# Patient Record
Sex: Male | Born: 1950 | Race: White | Hispanic: No | Marital: Married | Smoking: Never smoker
Health system: Southern US, Community
[De-identification: ages and names within clinical notes are randomized; demographics above are authoritative.]

## PROBLEM LIST (undated history)

## (undated) DIAGNOSIS — I10 Essential (primary) hypertension: Secondary | ICD-10-CM

## (undated) DIAGNOSIS — J9600 Acute respiratory failure, unspecified whether with hypoxia or hypercapnia: Secondary | ICD-10-CM

## (undated) DIAGNOSIS — R569 Unspecified convulsions: Principal | ICD-10-CM

## (undated) DIAGNOSIS — I629 Nontraumatic intracranial hemorrhage, unspecified: Secondary | ICD-10-CM

## (undated) DIAGNOSIS — N200 Calculus of kidney: Secondary | ICD-10-CM

## (undated) HISTORY — PX: TONSILLECTOMY: SUR1361

## (undated) HISTORY — PX: OTHER SURGICAL HISTORY: SHX169

## (undated) HISTORY — PX: LITHOTRIPSY: SUR834

---

## 2002-07-15 ENCOUNTER — Encounter: Payer: Self-pay | Admitting: Internal Medicine

## 2003-11-05 ENCOUNTER — Ambulatory Visit (HOSPITAL_COMMUNITY): Admission: RE | Admit: 2003-11-05 | Discharge: 2003-11-05 | Payer: Self-pay | Admitting: Gastroenterology

## 2007-06-11 ENCOUNTER — Ambulatory Visit: Payer: Self-pay | Admitting: Internal Medicine

## 2007-06-11 DIAGNOSIS — I10 Essential (primary) hypertension: Secondary | ICD-10-CM | POA: Insufficient documentation

## 2007-06-11 DIAGNOSIS — J309 Allergic rhinitis, unspecified: Secondary | ICD-10-CM | POA: Insufficient documentation

## 2007-06-11 DIAGNOSIS — E785 Hyperlipidemia, unspecified: Secondary | ICD-10-CM | POA: Insufficient documentation

## 2007-06-13 ENCOUNTER — Encounter: Payer: Self-pay | Admitting: Internal Medicine

## 2007-06-13 LAB — CONVERTED CEMR LAB
Albumin: 4 g/dL (ref 3.5–5.2)
BUN: 16 mg/dL (ref 6–23)
Basophils Absolute: 0 10*3/uL (ref 0.0–0.1)
Basophils Relative: 0.7 % (ref 0.0–1.0)
CO2: 30 meq/L (ref 19–32)
Calcium: 9.4 mg/dL (ref 8.4–10.5)
Chloride: 108 meq/L (ref 96–112)
Cholesterol: 165 mg/dL (ref 0–200)
Creatinine, Ser: 1 mg/dL (ref 0.4–1.5)
Eosinophils Absolute: 0.1 10*3/uL (ref 0.0–0.6)
Eosinophils Relative: 1.5 % (ref 0.0–5.0)
GFR calc Af Amer: 100 mL/min
GFR calc non Af Amer: 83 mL/min
Glucose, Bld: 81 mg/dL (ref 70–99)
HCT: 37.4 % — ABNORMAL LOW (ref 39.0–52.0)
HDL: 34.9 mg/dL — ABNORMAL LOW (ref >39.0)
Hemoglobin: 13.3 g/dL (ref 13.0–17.0)
LDL Cholesterol: 95 mg/dL (ref 0–99)
Lymphocytes Relative: 27 % (ref 12.0–46.0)
MCHC: 35.7 g/dL (ref 30.0–36.0)
MCV: 88.2 fL (ref 78.0–100.0)
Monocytes Absolute: 0.5 10*3/uL (ref 0.2–0.7)
Monocytes Relative: 7.9 % (ref 3.0–11.0)
Neutro Abs: 3.9 10*3/uL (ref 1.4–7.7)
Neutrophils Relative %: 62.9 % (ref 43.0–77.0)
PSA: 0.66 ng/mL (ref 0.10–4.00)
Phosphorus: 3.9 mg/dL (ref 2.3–4.6)
Platelets: 210 10*3/uL (ref 150–400)
Potassium: 4.5 meq/L (ref 3.5–5.1)
RBC: 4.24 M/uL (ref 4.22–5.81)
RDW: 11.8 % (ref 11.5–14.6)
Sodium: 142 meq/L (ref 135–145)
TSH: 1.14 microintl units/mL (ref 0.35–5.50)
Total CHOL/HDL Ratio: 4.7
Triglycerides: 177 mg/dL — ABNORMAL HIGH (ref 0–149)
VLDL: 35 mg/dL (ref 0–40)
WBC: 6.2 10*3/uL (ref 4.5–10.5)

## 2007-06-20 ENCOUNTER — Encounter: Payer: Self-pay | Admitting: Internal Medicine

## 2007-08-10 ENCOUNTER — Ambulatory Visit: Payer: Self-pay | Admitting: Internal Medicine

## 2007-10-24 ENCOUNTER — Encounter: Payer: Self-pay | Admitting: Internal Medicine

## 2010-11-23 NOTE — Assessment & Plan Note (Signed)
Summary: NEW TO [facility].Elizbeth Squires   History of Present Illness: Switching doctors due to recent move to [facility]  No acute problems or concerns  Current Allergies (reviewed today): ! 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     Vital Signs:  Patient Profile:   60 Years Old Male Height:     70 inches Weight:      169.50 pounds Temp:     98.8 degrees F oral Pulse rate:   76 / minute BP sitting:   130 / 76   Physical Exam  General:     alert and normal appearance.   Eyes:     pupils equal, pupils round, and pupils reactive to light.   Limited fundus view Ears:     R ear normal and L ear normal.   Mouth:     no lesions.   Neck:     supple, no masses, no thyromegaly, no carotid bruits, and no cervical lymphadenopathy.   Lungs:     normal respiratory effort and normal breath sounds.   Heart:     normal rate, regular rhythm, no murmur, and no gallop.   Abdomen:     soft, non-tender, no hepatomegaly, and no splenomegaly.   Rectal:     no external abnormalities and no masses.   Prostate:     no nodules.   Msk:     no joint tenderness and no joint swelling.   Extremities:     no edema Neurologic:     strength normal in all extremities and gait  normal.   Skin:     no suspicious lesions.   Psych:     normally interactive, good eye contact, not anxious appearing, and not depressed appearing.     Review of Systems  The patient denies vision loss, decreased hearing, chest pain, syncope, dyspnea on exhertion, prolonged cough, muscle weakness, suspicious skin lesions, depression, and enlarged lymph nodes.         Weight stable Wears seat belt No palpitations No change in exercise tolerance Bowels okay No urinary or sexual problems Feels slightly  weaker since on BP meds over several years No sig anxiety Bruises easy Takes loratadine year round as it prevents sinus drainage    Impression & Recommendations:  Problem # 1:  Preventive Health Care (ICD-V70.0) Assessment: Comment Only will check last Td--he thinks it is less than 10 years check PSA  Problem # 2:  HYPERTENSION (ICD-401.9) Assessment: Unchanged good control will check labs  His updated medication list for this problem includes:    Micardis 40 Mg Tabs (Telmisartan) .Marland Kitchen... Take 1 tablet by mouth once a day  Orders: Venipuncture (87564) TLB-Renal Function Panel (80069-RENAL) TLB-TSH (Thyroid Stimulating Hormone) (84443-TSH) TLB-CBC Platelet - w/Differential (85025-CBCD)   Problem # 3:  HYPERLIPIDEMIA (ICD-272.4) Assessment: Unchanged strong family history of CAD will check labs  His updated medication list for this problem includes:    Simvastatin 40 Mg Tabs (Simvastatin) .Marland Kitchen... Take 1 tablet by mouth once a day  Orders: TLB-Lipid Panel (80061-LIPID)   Problem # 4:  ALLERGIC RHINITIS (ICD-477.9) Assessment: Unchanged happy with loratadine  His updated medication list for this problem includes:    Loratadine 10 Mg Tabs (Loratadine) .Marland Kitchen... Take 1 tablet by mouth once a day   Complete Medication List: 1)  Micardis 40 Mg Tabs (Telmisartan) .... Take 1 tablet by mouth once a day 2)  Simvastatin 40 Mg Tabs (Simvastatin) .... Take 1 tablet by mouth  once a day 3)  Loratadine 10 Mg Tabs (Loratadine) .... Take 1 tablet by mouth once a day 4)  Adult Aspirin Low Strength 81 Mg Tbdp (Aspirin) .... Take 1 tablet by mouth once a day  Other Orders: TLB-PSA (Prostate Specific Antigen) (84153-PSA)   Patient Instructions: 1)  Please schedule a follow-up appointment in 1 year. 2)  Get records from [facility] for last 2 years and colonoscopy report and immunization records          Vital Signs:  Patient Profile:   60 Years Old Male Height:     70 inches Weight:      169.50 pounds Temp:     98.8 degrees F oral Pulse rate:   76 / minute BP sitting:   130 / 76  Vitals Entered By: Wandra Mannan (June 11, 2007 11:34 AM)               Prior Medications (reviewed today): Current Allergies (reviewed today): ! SULFA Current Medications (including changes made in today's visit):  MICARDIS 40 MG  TABS (TELMISARTAN) Take 1 tablet by mouth once a day SIMVASTATIN 40 MG  TABS (SIMVASTATIN) Take 1 tablet by mouth once a day LORATADINE 10 MG  TABS (LORATADINE) Take 1 tablet by mouth once a day ADULT ASPIRIN LOW STRENGTH 81 MG  TBDP (ASPIRIN) Take 1 tablet by mouth once a day   Colonoscopy  Procedure date:  11/05/2003  Findings:      Results: Normal.    Colonoscopy  Procedure date:  11/05/2003  Findings:      Results: Normal.

## 2010-11-23 NOTE — Miscellaneous (Signed)
  Clinical Lists Changes  Observations: Added new observation of COLONOSCOPY: 2 small ulcers in ileum from ASA No polyps or tics (11/05/2003 7:48) Added new observation of TD BOOSTER: Td (07/28/2003 7:49)       Colonoscopy  Procedure date:  11/05/2003  Findings:      2 small ulcers in ileum from ASA No polyps or tics   Colonoscopy  Procedure date:  11/05/2003  Findings:      2 small ulcers in ileum from ASA No polyps or tics    Tetanus/Td Immunization History:    Tetanus/Td # 1:  Td (07/28/2003)

## 2010-11-23 NOTE — Letter (Signed)
Summary: Internal Other  Internal Other   Imported By: Beau Fanny 06/11/2007 13:41:44  _____________________________________________________________________  External Attachment:    Type:   Image     Comment:   External Document

## 2010-11-23 NOTE — Assessment & Plan Note (Signed)
Summary: FLU  Nurse Visit    Prior Medications: MICARDIS 40 MG  TABS (TELMISARTAN) Take 1 tablet by mouth once a day SIMVASTATIN 40 MG  TABS (SIMVASTATIN) Take 1 tablet by mouth once a day LORATADINE 10 MG  TABS (LORATADINE) Take 1 tablet by mouth once a day ADULT ASPIRIN LOW STRENGTH 81 MG  TBDP (ASPIRIN) Take 1 tablet by mouth once a day Current Allergies: ! SULFA    Orders Added: 1)  Flu Vaccine 19yrs + [90658] 2)  Admin 1st Vaccine [email or web address]    ]  Influenza Vaccine    Vaccine Type: Fluvax 3+    Site: left deltoid    Mfr: Sanofi Pasteur    Dose: 0.5 ml    Route: IM    Given by: Providence Crosby    Exp. Date: 04/22/2008    Lot #: [id number]    VIS given: 04/22/05 version given August 10, 2007.  Flu Vaccine Consent Questions    Do you have a history of severe allergic reactions to this vaccine? no    Any prior history of allergic reactions to egg and/or gelatin? no    Do you have a sensitivity to the preservative Thimersol? no    Do you have a past history of Guillan-Barre Syndrome? no    Do you currently have an acute febrile illness? no    Have you ever had a severe reaction to latex? no    Vaccine information given and explained to patient? yes

## 2010-11-23 NOTE — Letter (Signed)
Summary: External Correspondence  External Correspondence   Imported By: Carrie Mew 06/13/2007 08:05:13  _____________________________________________________________________  External Attachment:    Type:   Image     Comment:   External Document

## 2010-11-23 NOTE — Letter (Signed)
Summary: Records Request-[facility] copied 10/31/07  Records Request-[facility] copied 10/31/07   Imported By: Beau Fanny 10/31/2007 16:18:35  _____________________________________________________________________  External Attachment:    Type:   Image     Comment:   External Document

## 2010-11-23 NOTE — Letter (Signed)
Summary: Historic Patient File  Historic Patient File   Imported By: Beau Fanny 06/20/2007 11:40:09  _____________________________________________________________________  External Attachment:    Type:   Image     Comment:   External Document

## 2011-03-11 NOTE — Op Note (Signed)
NAME:  Thomas Norman, Thomas Norman                         ACCOUNT NO.:  [id number]   MEDICAL RECORD NO.:  [id number]                   PATIENT TYPE:  AMB   LOCATION:  ENDO                                 FACILITY:  [facility]   PHYSICIAN:  Danise Edge, M.D.                DATE OF BIRTH:  18-Jun-1951   DATE OF PROCEDURE:  11/05/2003  DATE OF DISCHARGE:                                 OPERATIVE REPORT   PROCEDURE:  Screening colonoscopy.   PROCEDURE INDICATION:  Mr. Asser Lucena is a 60 year old male born Sep 16, 1951.  Mr. Poehler is scheduled to undergo his first screening  colonoscopy with polypectomy to prevent colon cancer.  He does take 81 mg  aspirin daily.   ENDOSCOPIST:  Danise Edge, M.D.   PREMEDICATION:  Versed 8 mg, Demerol 50 mg.   DESCRIPTION OF PROCEDURE:  After obtaining informed consent, Mr. Macleod was  placed in the left lateral decubitus position.  I administered intravenous  Demerol and intravenous Versed to achieve conscious sedation for the  procedure.  The patient's blood pressure, oxygen saturation, and cardiac  rhythm were monitored throughout the procedure and documented in the medical  record.   Anal inspection was normal.  Digital rectal exam revealed a non-nodular  prostate.  The Olympus pediatric adjustable colonoscope was introduced into  the rectum, advanced to the cecum.  The ileocecal valve was intubated and  the distal ileum inspected.  Colonic preparation for the exam today was  excellent.   Rectum normal.   Sigmoid colon and descending colon normal.   Splenic flexure normal.   Transverse colon normal.   Hepatic flexure normal.   Ascending colon normal.   Cecum and ileocecal valve normal.   Distal ileum:  There are two small circular ulcers with exudative bases in  the very distal ileum due to aspirin.   ASSESSMENT:  1. Normal screening proctocolonoscopy to the cecum.  2. Two small ulcers are present in the very distal ileum due to  aspirin.                                               Danise Edge, M.D.    MJ/MEDQ  D:  11/05/2003  T:  11/05/2003  Job:  [id number]   cc:   Dellis Anes. Idell Pickles, M.D.  [address]  [place]  [place] [address]  Fax: [phone]

## 2011-05-03 ENCOUNTER — Ambulatory Visit
Admission: RE | Admit: 2011-05-03 | Discharge: 2011-05-03 | Disposition: A | Discharge status: Home / Self Care | Payer: BC Managed Care – PPO | Source: Ambulatory Visit | Attending: Family Medicine | Admitting: Family Medicine

## 2011-05-03 ENCOUNTER — Other Ambulatory Visit: Payer: Self-pay | Admitting: Family Medicine

## 2011-05-23 ENCOUNTER — Ambulatory Visit (HOSPITAL_COMMUNITY)
Admission: RE | Admit: 2011-05-23 | Discharge: 2011-05-23 | Disposition: A | Discharge status: Home / Self Care | Payer: BC Managed Care – PPO | Source: Ambulatory Visit | Attending: Urology | Admitting: Urology

## 2011-05-23 DIAGNOSIS — N201 Calculus of ureter: Secondary | ICD-10-CM | POA: Insufficient documentation

## 2011-05-23 DIAGNOSIS — I10 Essential (primary) hypertension: Secondary | ICD-10-CM | POA: Insufficient documentation

## 2012-02-13 ENCOUNTER — Other Ambulatory Visit: Payer: Self-pay | Admitting: Urology

## 2012-02-16 ENCOUNTER — Encounter (HOSPITAL_COMMUNITY): Payer: Self-pay | Admitting: *Deleted

## 2012-02-16 NOTE — Pre-Procedure Instructions (Signed)
To arrive in [facility] at 0900 with blue folder, insurance info, driver. To follow laxative instructions in blue folder, to avoid taking any aspirin, ibuprofen etc prior to litho . NPO after midnight except sips with meds. patient verbalizes understanding of instructions.

## 2012-02-21 ENCOUNTER — Encounter (HOSPITAL_COMMUNITY): Payer: Self-pay | Admitting: Pharmacy Technician

## 2012-02-27 ENCOUNTER — Encounter (HOSPITAL_COMMUNITY)
Admission: RE | Disposition: A | Discharge status: Home / Self Care | Payer: Self-pay | Source: Ambulatory Visit | Attending: Urology

## 2012-02-27 ENCOUNTER — Ambulatory Visit (HOSPITAL_COMMUNITY): Payer: BC Managed Care – PPO

## 2012-02-27 ENCOUNTER — Ambulatory Visit (HOSPITAL_COMMUNITY)
Admission: RE | Admit: 2012-02-27 | Discharge: 2012-02-27 | Disposition: A | Discharge status: Home / Self Care | Payer: BC Managed Care – PPO | Source: Ambulatory Visit | Attending: Urology | Admitting: Urology

## 2012-02-27 ENCOUNTER — Encounter (HOSPITAL_COMMUNITY): Payer: Self-pay | Admitting: *Deleted

## 2012-02-27 DIAGNOSIS — I10 Essential (primary) hypertension: Secondary | ICD-10-CM | POA: Insufficient documentation

## 2012-02-27 DIAGNOSIS — Z79899 Other long term (current) drug therapy: Secondary | ICD-10-CM | POA: Insufficient documentation

## 2012-02-27 DIAGNOSIS — N201 Calculus of ureter: Secondary | ICD-10-CM | POA: Insufficient documentation

## 2012-02-27 DIAGNOSIS — Z7982 Long term (current) use of aspirin: Secondary | ICD-10-CM | POA: Insufficient documentation

## 2012-02-27 HISTORY — DX: Essential (primary) hypertension: I10

## 2012-02-27 HISTORY — DX: Calculus of kidney: N20.0

## 2012-02-27 SURGERY — LITHOTRIPSY, ESWL
Anesthesia: LOCAL | Laterality: Left

## 2012-02-27 MED ORDER — ONDANSETRON HCL 4 MG/2ML IJ SOLN: 4.0000 mg | Freq: Four times a day (QID) | INTRAMUSCULAR | Status: DC | PRN

## 2012-02-27 MED ORDER — DEXTROSE-NACL 5-0.45 % IV SOLN
INTRAVENOUS | Status: DC
  Administered 2012-02-27: 10:00:00 via INTRAVENOUS

## 2012-02-27 MED ORDER — ACETAMINOPHEN 325 MG PO TABS: 650.0000 mg | ORAL_TABLET | ORAL | Status: DC | PRN

## 2012-02-27 MED ORDER — DIPHENHYDRAMINE HCL 25 MG PO CAPS
25.0000 mg | ORAL_CAPSULE | ORAL | Status: AC
  Administered 2012-02-27: 25 mg via ORAL

## 2012-02-27 MED ORDER — FENTANYL CITRATE 0.05 MG/ML IJ SOLN: 25.0000 ug | INTRAMUSCULAR | Status: DC | PRN

## 2012-02-27 MED ORDER — DIPHENHYDRAMINE HCL 25 MG PO CAPS
ORAL_CAPSULE | ORAL | Status: AC
  Administered 2012-02-27: 25 mg via ORAL
  Filled 2012-02-27: qty 1

## 2012-02-27 MED ORDER — SODIUM CHLORIDE 0.9 % IJ SOLN: 3.0000 mL | INTRAMUSCULAR | Status: DC | PRN

## 2012-02-27 MED ORDER — OXYCODONE-ACETAMINOPHEN 5-325 MG PO TABS: 1.0000 | ORAL_TABLET | ORAL | Status: AC | PRN

## 2012-02-27 MED ORDER — DIAZEPAM 5 MG PO TABS
ORAL_TABLET | ORAL | Status: AC
  Administered 2012-02-27: 10 mg via ORAL
  Filled 2012-02-27: qty 2

## 2012-02-27 MED ORDER — DIAZEPAM 5 MG PO TABS
10.0000 mg | ORAL_TABLET | ORAL | Status: AC
  Administered 2012-02-27: 10 mg via ORAL

## 2012-02-27 MED ORDER — ACETAMINOPHEN 650 MG RE SUPP
650.0000 mg | RECTAL | Status: DC | PRN
  Filled 2012-02-27: qty 1

## 2012-02-27 MED ORDER — CIPROFLOXACIN HCL 500 MG PO TABS
ORAL_TABLET | ORAL | Status: AC
  Administered 2012-02-27: 500 mg via ORAL
  Filled 2012-02-27: qty 1

## 2012-02-27 MED ORDER — OXYCODONE HCL 5 MG PO TABS: 5.0000 mg | ORAL_TABLET | ORAL | Status: DC | PRN

## 2012-02-27 MED ORDER — SODIUM CHLORIDE 0.9 % IV SOLN: 250.0000 mL | INTRAVENOUS | Status: DC | PRN

## 2012-02-27 MED ORDER — CIPROFLOXACIN HCL 500 MG PO TABS
500.0000 mg | ORAL_TABLET | ORAL | Status: AC
  Administered 2012-02-27: 500 mg via ORAL

## 2012-02-27 MED ORDER — SODIUM CHLORIDE 0.9 % IJ SOLN: 3.0000 mL | Freq: Two times a day (BID) | INTRAMUSCULAR | Status: DC

## 2012-02-27 NOTE — Interval H&P Note (Signed)
History and Physical Interval Note:  02/27/2012 11:39 AM  Thomas Norman  has presented today for surgery, with the diagnosis of left distal stone  The various methods of treatment have been discussed with the patient and family. After consideration of risks, benefits and other options for treatment, the patient has consented to  Procedure(s) (LRB): EXTRACORPOREAL SHOCK WAVE LITHOTRIPSY (ESWL) (Left) as a surgical intervention .  The patients' history has been reviewed, patient examined, no change in status, stable for surgery.  I have reviewed the patients' chart and labs.  Questions were answered to the patient's satisfaction.     Dalynn Jhaveri J

## 2012-02-27 NOTE — H&P (Signed)
ctive Problems Problems  1. Distal Ureteral Stone On The Left 592.1 2. Nephrolithiasis Of The Left Kidney 592.0  History of Present Illness  Thomas Norman returns today in f/u.  He has a history of ESWL for a left ureteral stone in 7/12 and has a known left renal stone and has a left distal ureteral stone that was noted on a film in 2/13 and is unchanged on KUB today.   The stone is about 5mm in diameter.  He has had no pain or hematuria since last year.  He remains on Rapaflo.   Past Medical History Problems  1. History of  Squamous Cell Carcinoma 199.1 2. History of  Ureteral Stone Left 592.1  Surgical History Problems  1. History of  Biopsy Skin 2. History of  Lithotripsy 3. History of  Tonsillectomy  Current Meds 1. Aspirin 81 MG Oral Tablet; Therapy: (Recorded:26Jul2012) to 2. CoQ10 CAPS; Therapy: (Recorded:26Jul2012) to 3. Fish Oil CAPS; Therapy: (Recorded:26Jul2012) to 4. Micardis 80 MG Oral Tablet; Therapy: 28Sep2012 to 5. Multi-Vitamin TABS; Therapy: (Recorded:26Jul2012) to 6. Rapaflo 8 MG Oral Capsule; TAKE 1 CAPSULE DAILY WITH FOOD; Therapy: 13Feb2013 to  (Evaluate:27Feb2013); Last Rx:13Feb2013 7. Vitamin D3 CAPS; Therapy: (Recorded:26Jul2012) to  Allergies Medication  1. Sulfa Drugs  Family History Problems  1. Paternal history of  Acute Myocardial Infarction V17.3 2. Maternal history of  Arthritis V17.7 3. Family history of  Death In The Family Father deceased age 45--MI 4. Maternal history of  Dementia 5. Family history of  Family Health Status - Mother's Age age 71 6. Family history of  Family Health Status Number Of Children 1 daughter  Social History Problems  1. Alcohol Use 1/2 cup per day 2. Caffeine Use 2 coffee or tea per day 3. Marital History - Currently Married 4. Never A Smoker 5. Occupation: CPA/Business Owner  Review of Systems  Gastrointestinal: no nausea.  Constitutional: no fever.  Cardiovascular: no chest pain.  Respiratory: no  shortness of breath.    Vitals Vital Signs [Data Includes: Last 1 Day]  22Apr2013 08:43AM  Blood Pressure: 112 / 73 Temperature: 98.4 F Heart Rate: 71  Physical Exam Constitutional: Well nourished and well developed . No acute distress.  Pulmonary: No respiratory distress and normal respiratory rhythm and effort.  Cardiovascular: Heart rate and rhythm are normal . No peripheral edema.    Results/Data Urine [Data Includes: Last 1 Day]   22Apr2013  COLOR YELLOW   APPEARANCE CLEAR   SPECIFIC GRAVITY 1.015   pH 7.0   GLUCOSE NEG mg/dL  BILIRUBIN NEG   KETONE NEG mg/dL  BLOOD NEG   PROTEIN NEG mg/dL  UROBILINOGEN 0.2 mg/dL  NITRITE NEG   LEUKOCYTE ESTERASE NEG    The following images/tracing/specimen were independently visualized:  KUB today shows no change in the 5mm left distal stone or the left lower pole fragments. The film is otherwise unremarkable.    Assessment Assessed  1. Distal Ureteral Stone On The Left 592.1 2. Nephrolithiasis Of The Left Kidney 592.0      His left distal stone is unchanged as are the lower pole fragments.   Plan Distal Ureteral Stone On The Left (592.1)  1. Follow-up Schedule Surgery Office  Follow-up  Requested for: 22Apr2013 2. HYPERCALCIURA PROFILE  Requested for: 22Apr2013 Health Maintenance (V70.0)  3. UA With REFLEX  Done: 22Apr2013 08:14AM   I discussed ESWL vs ureteroscopy and he wants to go with ESWL of the distal stone.   I reviewed the risks of bleeding,  infection, ureteral injury, injury to adjacent structures, need for a stent and secondary procedures, thrombotic events and sedation complications. I will get a hypercalcuria profile today.

## 2012-02-27 NOTE — Discharge Instructions (Signed)
Lithotripsy for Kidney Stones  WHAT ARE KIDNEY STONES?  The kidneys filter blood for chemicals the body cannot use. These waste chemicals are eliminated in the urine. They are removed from the body. Under some conditions, these chemicals may become concentrated. When this happens, they form crystals in the urine. When these crystals build up and stick together, stones may form. When these stones block the flow of urine through the urinary tract, they may cause severe pain. The urinary tract is very sensitive to blockage and stretching by the stone.  WHAT IS LITHOTRIPSY?  Lithotripsy is a treatment that can sometimes help eliminate kidney stones and pain faster. A form of lithotripsy, also known as ESWL (extracorporeal shock wave lithotripsy), is a nonsurgical procedure that helps your body rid itself of the kidney stone with a minimum amount of pain. EWSL is a method of crushing a kidney stone with shock waves. These shock waves pass through your body. They cause the kidney stones to crumble while still in the urinary tract. It is then easier for the smaller pieces of stone to pass in the urine.  Lithotripsy usually takes about an hour. It is done in a hospital, a lithotripsy center, or a mobile unit. It usually does not require an overnight stay. Your caregiver will instruct you on preparation for the procedure. Your caregiver will tell you what to expect afterward.  LET YOUR CAREGIVER KNOW ABOUT:   Allergies.    Medicines taken including herbs, eye drops, over the counter medicines (including aspirin, aleve, or motrin for treatment of inflammatory conditions) and creams.    Use of steroids (by mouth or creams).    Previous problems with anesthetics or novocaine.    Possibility of pregnancy, if this applies.    History of blood clots (thrombophlebitis).    History of bleeding or blood problems.    Previous surgery.    Other health problems.   RISKS AND COMPLICATIONS   Complications of lithotripsy are uncommon, but include the following:   Infection.    Bleeding of the kidney.    Bruising of the kidney or skin.    Obstruction of the ureter (the passageway from the kidney to the bladder).    Failure of the stone to fragment (break apart).   PROCEDURE  A stent (flexible tube with holes) may be placed in your ureter. The ureter is the tube that transports the urine from the kidneys to the bladder. Your caregiver may place a stent before the procedure. This will help keep urine flowing from the kidney if the fragments of the stone block the ureter. You may receive an intravenous (IV) line to give you fluids and medicines. These medicines may help you relax or make you sleep. During the procedure, you will lie comfortably on a fluid-filled cushion or in a warm-water bath. After an x-ray or ultrasound locates your stone, shock waves are aimed at the stone. If you are awake, you may feel a tapping sensation (feeling) as the shock waves pass through your body. If large stone particles remain after treatment, a second procedure may be necessary at a later date.  For comfort during the test:   Relax as much as possible.    Try to remain still as much as possible.    Try to follow instructions to speed up the test.    Let your caregiver know if you are uncomfortable, anxious, or in pain.   AFTER THE PROCEDURE    After surgery, you will be   taken to the recovery area. A nurse will watch and check your progress. Once you're awake, stable, and taking fluids well, you will be allowed to go home as long as there are no problems. You may be prescribed antibiotics (medicines that kill germs) to help prevent infection. You may also be prescribed pain medicine if needed. In a week or two, your doctor may remove your stent, if you have one. Your caregiver will check to see whether or not stone particles remain.  PASSING THE STONE   It may take anywhere from a day to several weeks for the stone particles to leave your body. During this time, drink at least 8 to 12 eight ounce glasses of water every day. It is normal for your urine to be cloudy or slightly bloody for a few weeks following this procedure. You may even see small pieces of stone in your urine. A slight fever and some pain are also normal. Your caregiver may ask you to strain your urine to collect some stone particles for chemical analysis. If you find particles while straining the urine, save them. Analysis tells you and the caregiver what the stone is made of. Knowing this may help prevent future stones.  PREVENTING FUTURE STONES   Drink about 8 to 12, eight-ounce glasses of water every day.    Follow the diet your caregiver recommends.    Take your prescribed medicine.    See your caregiver regularly for checkups.   SEEK IMMEDIATE MEDICAL CARE IF:   You develop an oral temperature above 102 F (38.9 C), or as your caregiver suggests.    Your pain is not relieved by medicine.    You develop nausea (feeling sick to your stomach) and vomiting.    You develop heavy bleeding.    You have difficulty urinating.   Document Released: 10/07/2000 Document Revised: 09/29/2011 Document Reviewed: 08/01/2008  [employer] Patient Information 2012 [employer].

## 2012-07-19 ENCOUNTER — Other Ambulatory Visit: Payer: Self-pay | Admitting: Family Medicine

## 2012-07-19 DIAGNOSIS — R935 Abnormal findings on diagnostic imaging of other abdominal regions, including retroperitoneum: Secondary | ICD-10-CM

## 2012-07-24 ENCOUNTER — Ambulatory Visit
Admission: RE | Admit: 2012-07-24 | Discharge: 2012-07-24 | Disposition: A | Discharge status: Home / Self Care | Payer: BC Managed Care – PPO | Source: Ambulatory Visit | Attending: Family Medicine | Admitting: Family Medicine

## 2012-07-24 DIAGNOSIS — R935 Abnormal findings on diagnostic imaging of other abdominal regions, including retroperitoneum: Secondary | ICD-10-CM

## 2015-09-16 ENCOUNTER — Other Ambulatory Visit: Payer: Self-pay | Admitting: Gastroenterology

## 2015-10-15 ENCOUNTER — Ambulatory Visit (HOSPITAL_COMMUNITY)
Admission: RE | Admit: 2015-10-15 | Payer: BLUE CROSS/BLUE SHIELD | Source: Ambulatory Visit | Admitting: Gastroenterology

## 2015-10-15 ENCOUNTER — Encounter (HOSPITAL_COMMUNITY): Admission: RE | Payer: Self-pay | Source: Ambulatory Visit

## 2015-10-15 SURGERY — COLONOSCOPY WITH PROPOFOL
Anesthesia: Monitor Anesthesia Care

## 2017-12-05 DIAGNOSIS — L821 Other seborrheic keratosis: Secondary | ICD-10-CM | POA: Diagnosis not present

## 2017-12-05 DIAGNOSIS — D1801 Hemangioma of skin and subcutaneous tissue: Secondary | ICD-10-CM | POA: Diagnosis not present

## 2017-12-05 DIAGNOSIS — D225 Melanocytic nevi of trunk: Secondary | ICD-10-CM | POA: Diagnosis not present

## 2017-12-05 DIAGNOSIS — Z85820 Personal history of malignant melanoma of skin: Secondary | ICD-10-CM | POA: Diagnosis not present

## 2017-12-05 DIAGNOSIS — Z85828 Personal history of other malignant neoplasm of skin: Secondary | ICD-10-CM | POA: Diagnosis not present

## 2017-12-05 DIAGNOSIS — L570 Actinic keratosis: Secondary | ICD-10-CM | POA: Diagnosis not present

## 2018-06-27 DIAGNOSIS — E7800 Pure hypercholesterolemia, unspecified: Secondary | ICD-10-CM | POA: Diagnosis not present

## 2018-06-27 DIAGNOSIS — Z1159 Encounter for screening for other viral diseases: Secondary | ICD-10-CM | POA: Diagnosis not present

## 2018-06-27 DIAGNOSIS — D692 Other nonthrombocytopenic purpura: Secondary | ICD-10-CM | POA: Diagnosis not present

## 2018-06-27 DIAGNOSIS — Z0000 Encounter for general adult medical examination without abnormal findings: Secondary | ICD-10-CM | POA: Diagnosis not present

## 2018-06-27 DIAGNOSIS — I10 Essential (primary) hypertension: Secondary | ICD-10-CM | POA: Diagnosis not present

## 2018-06-27 DIAGNOSIS — Z1211 Encounter for screening for malignant neoplasm of colon: Secondary | ICD-10-CM | POA: Diagnosis not present

## 2018-06-27 DIAGNOSIS — Z125 Encounter for screening for malignant neoplasm of prostate: Secondary | ICD-10-CM | POA: Diagnosis not present

## 2018-11-09 DIAGNOSIS — H35373 Puckering of macula, bilateral: Secondary | ICD-10-CM | POA: Diagnosis not present

## 2018-12-10 DIAGNOSIS — D225 Melanocytic nevi of trunk: Secondary | ICD-10-CM | POA: Diagnosis not present

## 2018-12-10 DIAGNOSIS — Z85828 Personal history of other malignant neoplasm of skin: Secondary | ICD-10-CM | POA: Diagnosis not present

## 2018-12-10 DIAGNOSIS — L820 Inflamed seborrheic keratosis: Secondary | ICD-10-CM | POA: Diagnosis not present

## 2018-12-10 DIAGNOSIS — L718 Other rosacea: Secondary | ICD-10-CM | POA: Diagnosis not present

## 2018-12-10 DIAGNOSIS — C44311 Basal cell carcinoma of skin of nose: Secondary | ICD-10-CM | POA: Diagnosis not present

## 2018-12-10 DIAGNOSIS — L821 Other seborrheic keratosis: Secondary | ICD-10-CM | POA: Diagnosis not present

## 2018-12-10 DIAGNOSIS — L570 Actinic keratosis: Secondary | ICD-10-CM | POA: Diagnosis not present

## 2018-12-10 DIAGNOSIS — D485 Neoplasm of uncertain behavior of skin: Secondary | ICD-10-CM | POA: Diagnosis not present

## 2018-12-10 DIAGNOSIS — Z85820 Personal history of malignant melanoma of skin: Secondary | ICD-10-CM | POA: Diagnosis not present

## 2018-12-10 DIAGNOSIS — D0461 Carcinoma in situ of skin of right upper limb, including shoulder: Secondary | ICD-10-CM | POA: Diagnosis not present

## 2019-01-01 DIAGNOSIS — Z85828 Personal history of other malignant neoplasm of skin: Secondary | ICD-10-CM | POA: Diagnosis not present

## 2019-01-01 DIAGNOSIS — C44311 Basal cell carcinoma of skin of nose: Secondary | ICD-10-CM | POA: Diagnosis not present

## 2019-07-04 DIAGNOSIS — I10 Essential (primary) hypertension: Secondary | ICD-10-CM | POA: Diagnosis not present

## 2019-07-04 DIAGNOSIS — Z1211 Encounter for screening for malignant neoplasm of colon: Secondary | ICD-10-CM | POA: Diagnosis not present

## 2019-07-04 DIAGNOSIS — E7800 Pure hypercholesterolemia, unspecified: Secondary | ICD-10-CM | POA: Diagnosis not present

## 2019-07-04 DIAGNOSIS — Z0000 Encounter for general adult medical examination without abnormal findings: Secondary | ICD-10-CM | POA: Diagnosis not present

## 2019-07-09 DIAGNOSIS — Z1211 Encounter for screening for malignant neoplasm of colon: Secondary | ICD-10-CM | POA: Diagnosis not present

## 2019-07-09 DIAGNOSIS — I10 Essential (primary) hypertension: Secondary | ICD-10-CM | POA: Diagnosis not present

## 2019-07-09 DIAGNOSIS — E7800 Pure hypercholesterolemia, unspecified: Secondary | ICD-10-CM | POA: Diagnosis not present

## 2019-07-14 ENCOUNTER — Inpatient Hospital Stay (HOSPITAL_COMMUNITY): Payer: Medicare Other

## 2019-07-14 ENCOUNTER — Emergency Department (HOSPITAL_COMMUNITY): Payer: Medicare Other

## 2019-07-14 ENCOUNTER — Encounter (HOSPITAL_COMMUNITY): Payer: Self-pay | Admitting: Emergency Medicine

## 2019-07-14 ENCOUNTER — Inpatient Hospital Stay (HOSPITAL_COMMUNITY)
Admission: EM | Admit: 2019-07-14 | Discharge: 2019-07-19 | DRG: 064 | Disposition: A | Discharge status: Home / Self Care | Payer: Medicare Other | Attending: Family Medicine | Admitting: Family Medicine

## 2019-07-14 ENCOUNTER — Other Ambulatory Visit: Payer: Self-pay

## 2019-07-14 DIAGNOSIS — Z79899 Other long term (current) drug therapy: Secondary | ICD-10-CM

## 2019-07-14 DIAGNOSIS — Z882 Allergy status to sulfonamides status: Secondary | ICD-10-CM | POA: Diagnosis not present

## 2019-07-14 DIAGNOSIS — J189 Pneumonia, unspecified organism: Secondary | ICD-10-CM

## 2019-07-14 DIAGNOSIS — Z833 Family history of diabetes mellitus: Secondary | ICD-10-CM | POA: Diagnosis not present

## 2019-07-14 DIAGNOSIS — Z4682 Encounter for fitting and adjustment of non-vascular catheter: Secondary | ICD-10-CM | POA: Diagnosis not present

## 2019-07-14 DIAGNOSIS — Z87442 Personal history of urinary calculi: Secondary | ICD-10-CM

## 2019-07-14 DIAGNOSIS — R4701 Aphasia: Secondary | ICD-10-CM | POA: Diagnosis not present

## 2019-07-14 DIAGNOSIS — J9692 Respiratory failure, unspecified with hypercapnia: Secondary | ICD-10-CM | POA: Diagnosis not present

## 2019-07-14 DIAGNOSIS — Z9842 Cataract extraction status, left eye: Secondary | ICD-10-CM

## 2019-07-14 DIAGNOSIS — I161 Hypertensive emergency: Secondary | ICD-10-CM | POA: Diagnosis not present

## 2019-07-14 DIAGNOSIS — I618 Other nontraumatic intracerebral hemorrhage: Secondary | ICD-10-CM | POA: Diagnosis not present

## 2019-07-14 DIAGNOSIS — I629 Nontraumatic intracranial hemorrhage, unspecified: Secondary | ICD-10-CM | POA: Diagnosis not present

## 2019-07-14 DIAGNOSIS — Z978 Presence of other specified devices: Secondary | ICD-10-CM

## 2019-07-14 DIAGNOSIS — E785 Hyperlipidemia, unspecified: Secondary | ICD-10-CM | POA: Diagnosis not present

## 2019-07-14 DIAGNOSIS — I10 Essential (primary) hypertension: Secondary | ICD-10-CM | POA: Diagnosis not present

## 2019-07-14 DIAGNOSIS — R569 Unspecified convulsions: Secondary | ICD-10-CM | POA: Diagnosis not present

## 2019-07-14 DIAGNOSIS — E873 Alkalosis: Secondary | ICD-10-CM | POA: Diagnosis not present

## 2019-07-14 DIAGNOSIS — E162 Hypoglycemia, unspecified: Secondary | ICD-10-CM | POA: Diagnosis present

## 2019-07-14 DIAGNOSIS — Z9841 Cataract extraction status, right eye: Secondary | ICD-10-CM

## 2019-07-14 DIAGNOSIS — J9600 Acute respiratory failure, unspecified whether with hypoxia or hypercapnia: Secondary | ICD-10-CM | POA: Diagnosis present

## 2019-07-14 DIAGNOSIS — Z8673 Personal history of transient ischemic attack (TIA), and cerebral infarction without residual deficits: Secondary | ICD-10-CM | POA: Diagnosis present

## 2019-07-14 DIAGNOSIS — J90 Pleural effusion, not elsewhere classified: Secondary | ICD-10-CM | POA: Diagnosis not present

## 2019-07-14 DIAGNOSIS — I611 Nontraumatic intracerebral hemorrhage in hemisphere, cortical: Principal | ICD-10-CM | POA: Diagnosis present

## 2019-07-14 DIAGNOSIS — Z5309 Procedure and treatment not carried out because of other contraindication: Secondary | ICD-10-CM

## 2019-07-14 DIAGNOSIS — G9340 Encephalopathy, unspecified: Secondary | ICD-10-CM | POA: Diagnosis not present

## 2019-07-14 DIAGNOSIS — I959 Hypotension, unspecified: Secondary | ICD-10-CM | POA: Diagnosis not present

## 2019-07-14 DIAGNOSIS — Z85828 Personal history of other malignant neoplasm of skin: Secondary | ICD-10-CM

## 2019-07-14 DIAGNOSIS — I619 Nontraumatic intracerebral hemorrhage, unspecified: Secondary | ICD-10-CM | POA: Diagnosis not present

## 2019-07-14 DIAGNOSIS — J9691 Respiratory failure, unspecified with hypoxia: Secondary | ICD-10-CM | POA: Diagnosis not present

## 2019-07-14 DIAGNOSIS — Z20828 Contact with and (suspected) exposure to other viral communicable diseases: Secondary | ICD-10-CM | POA: Diagnosis not present

## 2019-07-14 DIAGNOSIS — J9811 Atelectasis: Secondary | ICD-10-CM | POA: Diagnosis not present

## 2019-07-14 DIAGNOSIS — Z818 Family history of other mental and behavioral disorders: Secondary | ICD-10-CM | POA: Diagnosis not present

## 2019-07-14 DIAGNOSIS — N3090 Cystitis, unspecified without hematuria: Secondary | ICD-10-CM | POA: Diagnosis not present

## 2019-07-14 HISTORY — DX: Nontraumatic intracerebral hemorrhage, unspecified: I61.9

## 2019-07-14 LAB — BASIC METABOLIC PANEL
Anion gap: 8 (ref 5–15)
BUN: 14 mg/dL (ref 8–23)
CO2: 19 mmol/L — ABNORMAL LOW (ref 22–32)
Calcium: 8.4 mg/dL — ABNORMAL LOW (ref 8.9–10.3)
Chloride: 110 mmol/L (ref 98–111)
Creatinine, Ser: 1.04 mg/dL (ref 0.61–1.24)
GFR calc Af Amer: >60 mL/min (ref >60)
GFR calc non Af Amer: >60 mL/min (ref >60)
Glucose, Bld: 89 mg/dL (ref 70–99)
Potassium: 3.5 mmol/L (ref 3.5–5.1)
Sodium: 137 mmol/L (ref 135–145)

## 2019-07-14 LAB — TRIGLYCERIDES: Triglycerides: 141 mg/dL (ref <150)

## 2019-07-14 LAB — URINALYSIS, ROUTINE W REFLEX MICROSCOPIC
Bacteria, UA: NONE SEEN
Bilirubin Urine: NEGATIVE
Glucose, UA: NEGATIVE mg/dL
Ketones, ur: NEGATIVE mg/dL
Leukocytes,Ua: NEGATIVE
Nitrite: NEGATIVE
Protein, ur: 30 mg/dL — AB
RBC / HPF: >50 RBC/hpf — ABNORMAL HIGH (ref 0–5)
Specific Gravity, Urine: 1.016 (ref 1.005–1.030)
pH: 5 (ref 5.0–8.0)

## 2019-07-14 LAB — COMPREHENSIVE METABOLIC PANEL
ALT: 15 U/L (ref 0–44)
AST: 24 U/L (ref 15–41)
Albumin: 4.3 g/dL (ref 3.5–5.0)
Alkaline Phosphatase: 101 U/L (ref 38–126)
Anion gap: 22 — ABNORMAL HIGH (ref 5–15)
BUN: 16 mg/dL (ref 8–23)
CO2: 14 mmol/L — ABNORMAL LOW (ref 22–32)
Calcium: 9.9 mg/dL (ref 8.9–10.3)
Chloride: 111 mmol/L (ref 98–111)
Creatinine, Ser: 1.22 mg/dL (ref 0.61–1.24)
GFR calc Af Amer: >60 mL/min (ref >60)
GFR calc non Af Amer: >60 mL/min (ref >60)
Glucose, Bld: 114 mg/dL — ABNORMAL HIGH (ref 70–99)
Potassium: 3.7 mmol/L (ref 3.5–5.1)
Sodium: 147 mmol/L — ABNORMAL HIGH (ref 135–145)
Total Bilirubin: 0.8 mg/dL (ref 0.3–1.2)
Total Protein: 7.3 g/dL (ref 6.5–8.1)

## 2019-07-14 LAB — CBC
HCT: 49.8 % (ref 39.0–52.0)
Hemoglobin: 16 g/dL (ref 13.0–17.0)
MCH: 31.1 pg (ref 26.0–34.0)
MCHC: 32.1 g/dL (ref 30.0–36.0)
MCV: 96.7 fL (ref 80.0–100.0)
Platelets: 252 10*3/uL (ref 150–400)
RBC: 5.15 MIL/uL (ref 4.22–5.81)
RDW: 12.1 % (ref 11.5–15.5)
WBC: 9.2 10*3/uL (ref 4.0–10.5)
nRBC: 0 % (ref 0.0–0.2)

## 2019-07-14 LAB — POCT I-STAT 7, (LYTES, BLD GAS, ICA,H+H)
Acid-base deficit: 1 mmol/L (ref 0.0–2.0)
Acid-base deficit: 3 mmol/L — ABNORMAL HIGH (ref 0.0–2.0)
Bicarbonate: 21.7 mmol/L (ref 20.0–28.0)
Bicarbonate: 19.3 mmol/L — ABNORMAL LOW (ref 20.0–28.0)
Calcium, Ion: 1.21 mmol/L (ref 1.15–1.40)
Calcium, Ion: 1.21 mmol/L (ref 1.15–1.40)
HCT: 36 % — ABNORMAL LOW (ref 39.0–52.0)
HCT: 35 % — ABNORMAL LOW (ref 39.0–52.0)
Hemoglobin: 12.2 g/dL — ABNORMAL LOW (ref 13.0–17.0)
Hemoglobin: 11.9 g/dL — ABNORMAL LOW (ref 13.0–17.0)
O2 Saturation: 100 %
O2 Saturation: 100 %
Patient temperature: 97.8
Potassium: 3.7 mmol/L (ref 3.5–5.1)
Potassium: 3.3 mmol/L — ABNORMAL LOW (ref 3.5–5.1)
Sodium: 138 mmol/L (ref 135–145)
Sodium: 139 mmol/L (ref 135–145)
TCO2: 23 mmol/L (ref 22–32)
TCO2: 20 mmol/L — ABNORMAL LOW (ref 22–32)
pCO2 arterial: 30.7 mmHg — ABNORMAL LOW (ref 32.0–48.0)
pCO2 arterial: 25.4 mmHg — ABNORMAL LOW (ref 32.0–48.0)
pH, Arterial: 7.457 — ABNORMAL HIGH (ref 7.350–7.450)
pH, Arterial: 7.488 — ABNORMAL HIGH (ref 7.350–7.450)
pO2, Arterial: 422 mmHg — ABNORMAL HIGH (ref 83.0–108.0)
pO2, Arterial: 152 mmHg — ABNORMAL HIGH (ref 83.0–108.0)

## 2019-07-14 LAB — GLUCOSE, CAPILLARY
Glucose-Capillary: 74 mg/dL (ref 70–99)
Glucose-Capillary: 66 mg/dL — ABNORMAL LOW (ref 70–99)
Glucose-Capillary: 134 mg/dL — ABNORMAL HIGH (ref 70–99)

## 2019-07-14 LAB — DIFFERENTIAL
Abs Immature Granulocytes: 0.06 10*3/uL (ref 0.00–0.07)
Basophils Absolute: 0.1 10*3/uL (ref 0.0–0.1)
Basophils Relative: 1 %
Eosinophils Absolute: 0.2 10*3/uL (ref 0.0–0.5)
Eosinophils Relative: 2 %
Immature Granulocytes: 1 %
Lymphocytes Relative: 38 %
Lymphs Abs: 3.5 10*3/uL (ref 0.7–4.0)
Monocytes Absolute: 0.8 10*3/uL (ref 0.1–1.0)
Monocytes Relative: 9 %
Neutro Abs: 4.6 10*3/uL (ref 1.7–7.7)
Neutrophils Relative %: 49 %

## 2019-07-14 LAB — RAPID URINE DRUG SCREEN, HOSP PERFORMED
Amphetamines: NOT DETECTED
Barbiturates: NOT DETECTED
Benzodiazepines: NOT DETECTED
Cocaine: NOT DETECTED
Opiates: NOT DETECTED
Tetrahydrocannabinol: NOT DETECTED

## 2019-07-14 LAB — I-STAT CHEM 8, ED
BUN: 19 mg/dL (ref 8–23)
Calcium, Ion: 1.25 mmol/L (ref 1.15–1.40)
Chloride: 114 mmol/L — ABNORMAL HIGH (ref 98–111)
Creatinine, Ser: 1 mg/dL (ref 0.61–1.24)
Glucose, Bld: 105 mg/dL — ABNORMAL HIGH (ref 70–99)
HCT: 47 % (ref 39.0–52.0)
Hemoglobin: 16 g/dL (ref 13.0–17.0)
Potassium: 3.6 mmol/L (ref 3.5–5.1)
Sodium: 147 mmol/L — ABNORMAL HIGH (ref 135–145)
TCO2: 16 mmol/L — ABNORMAL LOW (ref 22–32)

## 2019-07-14 LAB — HEMOGLOBIN A1C
Hgb A1c MFr Bld: 5.2 % (ref 4.8–5.6)
Mean Plasma Glucose: 102.54 mg/dL

## 2019-07-14 LAB — PROTIME-INR
INR: 1.2 (ref 0.8–1.2)
Prothrombin Time: 14.7 seconds (ref 11.4–15.2)

## 2019-07-14 LAB — ETHANOL: Alcohol, Ethyl (B): <10 mg/dL (ref <10)

## 2019-07-14 LAB — MAGNESIUM: Magnesium: 1.8 mg/dL (ref 1.7–2.4)

## 2019-07-14 LAB — APTT: aPTT: 28 seconds (ref 24–36)

## 2019-07-14 LAB — MRSA PCR SCREENING: MRSA by PCR: NEGATIVE

## 2019-07-14 LAB — PHOSPHORUS: Phosphorus: 1.7 mg/dL — ABNORMAL LOW (ref 2.5–4.6)

## 2019-07-14 LAB — SARS CORONAVIRUS 2 BY RT PCR (HOSPITAL ORDER, PERFORMED IN ~~LOC~~ HOSPITAL LAB): SARS Coronavirus 2: NEGATIVE

## 2019-07-14 MED ORDER — PROPOFOL 1000 MG/100ML IV EMUL
0.0000 ug/kg/min | INTRAVENOUS | Status: DC
  Administered 2019-07-14: 40 ug/kg/min via INTRAVENOUS
  Administered 2019-07-14: 30 ug/kg/min via INTRAVENOUS
  Administered 2019-07-14: 33.512 ug/kg/min via INTRAVENOUS
  Administered 2019-07-15: 25 ug/kg/min via INTRAVENOUS
  Filled 2019-07-14 (×2): qty 100

## 2019-07-14 MED ORDER — ETOMIDATE 2 MG/ML IV SOLN
INTRAVENOUS | Status: AC | PRN
  Administered 2019-07-14: 30 mg via INTRAVENOUS

## 2019-07-14 MED ORDER — ORAL CARE MOUTH RINSE
15.0000 mL | OROMUCOSAL | Status: DC
  Administered 2019-07-14 – 2019-07-15 (×7): 15 mL via OROMUCOSAL

## 2019-07-14 MED ORDER — INSULIN ASPART 100 UNIT/ML ~~LOC~~ SOLN
0.0000 [IU] | SUBCUTANEOUS | Status: DC
  Administered 2019-07-15 (×2): 2 [IU] via SUBCUTANEOUS
  Administered 2019-07-16: 1 [IU] via SUBCUTANEOUS
  Administered 2019-07-16: 2 [IU] via SUBCUTANEOUS
  Administered 2019-07-17 (×2): 3 [IU] via SUBCUTANEOUS

## 2019-07-14 MED ORDER — ACETAMINOPHEN 325 MG PO TABS: 650.0000 mg | ORAL_TABLET | ORAL | Status: DC | PRN

## 2019-07-14 MED ORDER — SODIUM CHLORIDE 0.9 % IV SOLN
INTRAVENOUS | Status: DC | PRN
  Administered 2019-07-14: 250 mL via INTRAVENOUS

## 2019-07-14 MED ORDER — CHLORHEXIDINE GLUCONATE 0.12% ORAL RINSE (MEDLINE KIT)
15.0000 mL | Freq: Two times a day (BID) | OROMUCOSAL | Status: DC
  Administered 2019-07-14 – 2019-07-15 (×2): 15 mL via OROMUCOSAL

## 2019-07-14 MED ORDER — MAGNESIUM SULFATE 2 GM/50ML IV SOLN
2.0000 g | Freq: Once | INTRAVENOUS | Status: AC
  Administered 2019-07-14: 2 g via INTRAVENOUS
  Filled 2019-07-14: qty 50

## 2019-07-14 MED ORDER — SODIUM CHLORIDE 0.9 % IV SOLN
INTRAVENOUS | Status: DC
  Administered 2019-07-14: 13:00:00 via INTRAVENOUS

## 2019-07-14 MED ORDER — MIDAZOLAM HCL 2 MG/2ML IJ SOLN
2.0000 mg | Freq: Once | INTRAMUSCULAR | Status: AC
  Administered 2019-07-14: 2 mg via INTRAVENOUS
  Filled 2019-07-14: qty 2

## 2019-07-14 MED ORDER — POTASSIUM PHOSPHATES 15 MMOLE/5ML IV SOLN
24.0000 mmol | Freq: Once | INTRAVENOUS | Status: AC
  Administered 2019-07-14: 24 mmol via INTRAVENOUS
  Filled 2019-07-14: qty 8

## 2019-07-14 MED ORDER — CLEVIDIPINE BUTYRATE 0.5 MG/ML IV EMUL: 0.0000 mg/h | INTRAVENOUS | Status: DC

## 2019-07-14 MED ORDER — STROKE: EARLY STAGES OF RECOVERY BOOK
Freq: Once | Status: AC
  Administered 2019-07-14: 1
  Filled 2019-07-14: qty 1

## 2019-07-14 MED ORDER — PROPOFOL 1000 MG/100ML IV EMUL
INTRAVENOUS | Status: AC
  Filled 2019-07-14: qty 100

## 2019-07-14 MED ORDER — DEXTROSE 50 % IV SOLN
INTRAVENOUS | Status: AC
  Administered 2019-07-14: 25 mL
  Filled 2019-07-14: qty 50

## 2019-07-14 MED ORDER — ALBUTEROL SULFATE (2.5 MG/3ML) 0.083% IN NEBU: 2.5000 mg | INHALATION_SOLUTION | RESPIRATORY_TRACT | Status: DC | PRN

## 2019-07-14 MED ORDER — PANTOPRAZOLE SODIUM 40 MG PO PACK
40.0000 mg | PACK | Freq: Every day | ORAL | Status: DC
  Administered 2019-07-14: 40 mg
  Filled 2019-07-14 (×2): qty 20

## 2019-07-14 MED ORDER — FENTANYL 2500MCG IN NS 250ML (10MCG/ML) PREMIX INFUSION
25.0000 ug/h | INTRAVENOUS | Status: DC
  Administered 2019-07-14: 75 ug/h via INTRAVENOUS
  Administered 2019-07-14: 25 ug/h via INTRAVENOUS
  Administered 2019-07-15: 100 ug/h via INTRAVENOUS
  Filled 2019-07-14 (×2): qty 250

## 2019-07-14 MED ORDER — LEVETIRACETAM IN NACL 500 MG/100ML IV SOLN
500.0000 mg | Freq: Two times a day (BID) | INTRAVENOUS | Status: DC
  Administered 2019-07-14 – 2019-07-16 (×5): 500 mg via INTRAVENOUS
  Filled 2019-07-14 (×5): qty 100

## 2019-07-14 MED ORDER — SUCCINYLCHOLINE CHLORIDE 20 MG/ML IJ SOLN
INTRAMUSCULAR | Status: AC | PRN
Start: 2019-07-14 — End: 2019-07-14
  Administered 2019-07-14: 150 mg via INTRAVENOUS

## 2019-07-14 MED ORDER — ACETAMINOPHEN 650 MG RE SUPP: 650.0000 mg | RECTAL | Status: DC | PRN

## 2019-07-14 MED ORDER — GADOBUTROL 1 MMOL/ML IV SOLN
7.0000 mL | Freq: Once | INTRAVENOUS | Status: AC | PRN
  Administered 2019-07-14: 7 mL via INTRAVENOUS

## 2019-07-14 MED ORDER — ONDANSETRON HCL 4 MG/2ML IJ SOLN: 4.0000 mg | Freq: Four times a day (QID) | INTRAMUSCULAR | Status: DC | PRN

## 2019-07-14 MED ORDER — ACETAMINOPHEN 160 MG/5ML PO SOLN: 650.0000 mg | ORAL | Status: DC | PRN

## 2019-07-14 MED ORDER — SODIUM CHLORIDE 0.9 % IV SOLN
2000.0000 mg | Freq: Once | INTRAVENOUS | Status: AC
  Administered 2019-07-14: 2000 mg via INTRAVENOUS
  Filled 2019-07-14: qty 20

## 2019-07-14 MED ORDER — FENTANYL BOLUS VIA INFUSION
25.0000 ug | INTRAVENOUS | Status: DC | PRN
  Filled 2019-07-14: qty 25

## 2019-07-14 MED ORDER — NALOXONE HCL 0.4 MG/ML IJ SOLN
INTRAMUSCULAR | Status: AC
  Administered 2019-07-14: 11:00:00
  Filled 2019-07-14: qty 1

## 2019-07-14 MED ORDER — IOHEXOL 350 MG/ML SOLN
100.0000 mL | Freq: Once | INTRAVENOUS | Status: AC | PRN
  Administered 2019-07-14: 100 mL via INTRAVENOUS

## 2019-07-14 MED ORDER — SENNOSIDES-DOCUSATE SODIUM 8.6-50 MG PO TABS
1.0000 | ORAL_TABLET | Freq: Two times a day (BID) | ORAL | Status: DC
  Administered 2019-07-16 – 2019-07-19 (×7): 1 via ORAL
  Filled 2019-07-14 (×8): qty 1

## 2019-07-14 MED ORDER — CHLORHEXIDINE GLUCONATE CLOTH 2 % EX PADS
6.0000 | MEDICATED_PAD | Freq: Every day | CUTANEOUS | Status: DC
  Administered 2019-07-14: 6 via TOPICAL

## 2019-07-14 MED ORDER — PANTOPRAZOLE SODIUM 40 MG IV SOLR: 40.0000 mg | Freq: Every day | INTRAVENOUS | Status: DC

## 2019-07-14 MED ORDER — SODIUM CHLORIDE 0.9 % IV SOLN
INTRAVENOUS | Status: DC
  Administered 2019-07-14 – 2019-07-18 (×5): via INTRAVENOUS

## 2019-07-14 NOTE — Progress Notes (Signed)
EEG complete - results pending 

## 2019-07-14 NOTE — ED Provider Notes (Signed)
[facility] EMERGENCY DEPARTMENT Provider Note   CSN: [id number] Arrival date & time: 07/14/19  1042     History   Chief Complaint Chief Complaint  Patient presents with   Seizures    HPI Thomas Norman is a 68 y.o. male.  Presents the ER with altered mental status.  Last known normal was around 930 this morning.  Wife states patient was noted to be confused and brought to ER.  While in triage, reportedly had seizure episode then went unresponsive.  On my initial assessment patient was unresponsive, history limited due to acuity.  Level 5.     HPI  Past Medical History:  Diagnosis Date   Hypertension    Kidney calculi     Patient Active Problem List   Diagnosis Date Noted   ICH (intracerebral hemorrhage) ([place]) 07/14/2019   HYPERLIPIDEMIA 06/11/2007   HYPERTENSION 06/11/2007   ALLERGIC RHINITIS 06/11/2007    Past Surgical History:  Procedure Laterality Date   LITHOTRIPSY     skin cancers     TONSILLECTOMY          Home Medications    Prior to Admission medications   Medication Sig Start Date End Date Taking? Authorizing Provider  Coenzyme Q10 (CO Q 10 PO) Take 1 tablet by mouth daily.   Yes [provider]  fish oil-omega-3 fatty acids 1000 MG capsule Take 1 g by mouth daily.   Yes [provider]  telmisartan (MICARDIS) 40 MG tablet Take 40 mg by mouth daily before breakfast.   Yes [provider]    Family History Family History  Problem Relation Age of Onset   Diabetes Mother    Dementia Mother     Social History Social History   Tobacco Use   Smoking status: Never Smoker   Smokeless tobacco: Never Used  Substance Use Topics   Alcohol use: Yes    Alcohol/week: 1.0 standard drinks    Types: 1 Glasses of wine per week   Drug use: No     Allergies   Sulfonamide derivatives   Review of Systems Review of Systems  Unable to perform ROS: Acuity of condition     Physical  Exam Updated Vital Signs BP 110/78    Pulse 77    Temp 97.8 F (36.6 C) (Temporal)    Resp 18    Ht 6' (1.829 m)    Wt 74.6 kg Comment: using old weight   SpO2 100%    BMI 22.31 kg/m   Physical Exam Constitutional:      Comments: Unresponsive, snoring respirations, no purposeful movements  HENT:     Head: Normocephalic and atraumatic.     Nose: Nose normal.     Mouth/Throat:     Mouth: Mucous membranes are dry.  Eyes:     Pupils: Pupils are equal, round, and reactive to light.     Comments: Pupils 2 millimeters equal round and reactive  Neck:     Musculoskeletal: Neck supple. No neck rigidity.  Cardiovascular:     Rate and Rhythm: Tachycardia present.     Pulses: Normal pulses.     Heart sounds: Normal heart sounds.  Pulmonary:     Comments: Snoring respirations, good chest rise bilaterally, no tachypnea Abdominal:     General: Abdomen is flat. Bowel sounds are normal.  Skin:    General: Skin is warm.     Capillary Refill: Capillary refill takes less than 2 seconds.  Neurological:  Comments: Initial assessment, GCS 3, no motor response to painful stimuli, does not open eyes spontaneously or to painful stimuli, no verbal response to painful stimuli      ED Treatments / Results  Labs (all labs ordered are listed, but only abnormal results are displayed) Labs Reviewed  COMPREHENSIVE METABOLIC PANEL - Abnormal; Notable for the following components:      Result Value   Sodium 147 (*)    CO2 14 (*)    Glucose, Bld 114 (*)    Anion gap 22 (*)    All other components within normal limits  I-STAT CHEM 8, ED - Abnormal; Notable for the following components:   Sodium 147 (*)    Chloride 114 (*)    Glucose, Bld 105 (*)    TCO2 16 (*)    All other components within normal limits  SARS CORONAVIRUS 2 (HOSPITAL ORDER, [facility])  ETHANOL  PROTIME-INR  APTT  CBC  DIFFERENTIAL  TRIGLYCERIDES  RAPID URINE DRUG SCREEN, HOSP PERFORMED   URINALYSIS, ROUTINE W REFLEX MICROSCOPIC  HIV ANTIBODY (ROUTINE TESTING W REFLEX)    EKG None  Radiology Ct Angio Head W Or Wo Contrast  Result Date: 07/14/2019 CLINICAL DATA:  Follow-up intracranial hemorrhage. EXAM: CT ANGIOGRAPHY HEAD AND NECK TECHNIQUE: Multidetector CT imaging of the head and neck was performed using the standard protocol during bolus administration of intravenous contrast. Multiplanar CT image reconstructions and MIPs were obtained to evaluate the vascular anatomy. Carotid stenosis measurements (when applicable) are obtained utilizing NASCET criteria, using the distal internal carotid diameter as the denominator. CONTRAST:  131m OMNIPAQUE IOHEXOL 350 MG/ML SOLN COMPARISON:  None. FINDINGS: CTA NECK FINDINGS Aortic arch: Widely patent arch vessel origins. Common origin of the left subclavian artery and left vertebral artery from the arch with an ectatic/fusiform dilated appearance of the common vessel measuring 2 cm in diameter and with a normal caliber of the vertebral and subclavian arteries more distally. No saccular aneurysm. Right carotid system: Patent and smooth without evidence of stenosis or dissection. Left carotid system: Patent and smooth without evidence of stenosis or dissection. Vertebral arteries: Patent and smooth without evidence of stenosis or dissection. Mildly dominant right vertebral artery. Skeleton: Moderate cervical disc and facet degeneration. Other neck: No evidence of cervical lymphadenopathy or mass. Endotracheal and enteric tubes in place with the former terminating well above the carina. Upper chest: Clear lung apices. Review of the MIP images confirms the above findings CTA HEAD FINDINGS Anterior circulation: The internal carotid arteries are widely patent from skull base to carotid termini. ACAs and MCAs are patent without evidence of proximal branch occlusion or significant proximal stenosis. No aneurysm or vascular malformation is identified.  Posterior circulation: The intracranial vertebral arteries are widely patent to the basilar. A patent left PICA and bilateral SCAs are visualized. AICAs and a right PICA are not clearly identified. The basilar artery is widely patent. There is a small left posterior communicating artery. Both PCAs are patent without evidence of significant stenosis. No aneurysm or vascular malformation is identified. Venous sinuses: Not well evaluated due to arterial phase contrast timing. Anatomic variants: None. Review of the MIP images confirms the above findings IMPRESSION: No large vessel occlusion, stenosis, or significant atherosclerosis in the head and neck. Electronically Signed   By: ALogan BoresM.D.   On: 07/14/2019 12:14   Ct Angio Neck W Or Wo Contrast  Result Date: 07/14/2019 CLINICAL DATA:  Follow-up intracranial hemorrhage. EXAM: CT ANGIOGRAPHY HEAD  AND NECK TECHNIQUE: Multidetector CT imaging of the head and neck was performed using the standard protocol during bolus administration of intravenous contrast. Multiplanar CT image reconstructions and MIPs were obtained to evaluate the vascular anatomy. Carotid stenosis measurements (when applicable) are obtained utilizing NASCET criteria, using the distal internal carotid diameter as the denominator. CONTRAST:  156m OMNIPAQUE IOHEXOL 350 MG/ML SOLN COMPARISON:  None. FINDINGS: CTA NECK FINDINGS Aortic arch: Widely patent arch vessel origins. Common origin of the left subclavian artery and left vertebral artery from the arch with an ectatic/fusiform dilated appearance of the common vessel measuring 2 cm in diameter and with a normal caliber of the vertebral and subclavian arteries more distally. No saccular aneurysm. Right carotid system: Patent and smooth without evidence of stenosis or dissection. Left carotid system: Patent and smooth without evidence of stenosis or dissection. Vertebral arteries: Patent and smooth without evidence of stenosis or dissection.  Mildly dominant right vertebral artery. Skeleton: Moderate cervical disc and facet degeneration. Other neck: No evidence of cervical lymphadenopathy or mass. Endotracheal and enteric tubes in place with the former terminating well above the carina. Upper chest: Clear lung apices. Review of the MIP images confirms the above findings CTA HEAD FINDINGS Anterior circulation: The internal carotid arteries are widely patent from skull base to carotid termini. ACAs and MCAs are patent without evidence of proximal branch occlusion or significant proximal stenosis. No aneurysm or vascular malformation is identified. Posterior circulation: The intracranial vertebral arteries are widely patent to the basilar. A patent left PICA and bilateral SCAs are visualized. AICAs and a right PICA are not clearly identified. The basilar artery is widely patent. There is a small left posterior communicating artery. Both PCAs are patent without evidence of significant stenosis. No aneurysm or vascular malformation is identified. Venous sinuses: Not well evaluated due to arterial phase contrast timing. Anatomic variants: None. Review of the MIP images confirms the above findings IMPRESSION: No large vessel occlusion, stenosis, or significant atherosclerosis in the head and neck. Electronically Signed   By: ALogan BoresM.D.   On: 07/14/2019 12:14   Dg Chest Portable 1 View  Result Date: 07/14/2019 CLINICAL DATA:  Pt here from home with c/o aloc , pt had a seizure at the check in desk rushed back to trauma B ,Post intubation EXAM: PORTABLE CHEST 1 VIEW COMPARISON:  None. FINDINGS: Endotracheal tube tip projects 5.6 cm above the carina. Nasal/orogastric tube passes below the diaphragm within the stomach. Cardiac silhouette is normal in size. No mediastinal or hilar masses. Clear lungs.  No pleural effusion pneumothorax. Skeletal structures are grossly intact. IMPRESSION: 1. Endotracheal tube tip 5.6 cm above the carina. 2. Well-positioned  nasogastric tube. 3. No acute cardiopulmonary disease. Electronically Signed   By: DLajean ManesM.D.   On: 07/14/2019 11:50   Ct Head Code Stroke Wo Contrast  Result Date: 07/14/2019 CLINICAL DATA:  Code stroke. Altered level of consciousness. Seizure. EXAM: CT HEAD WITHOUT CONTRAST TECHNIQUE: Contiguous axial images were obtained from the base of the skull through the vertex without intravenous contrast. COMPARISON:  None. FINDINGS: Brain: An acute parenchymal hemorrhage in the posterior left parietal lobe measures 2.4 x 2.6 x 1.5 cm (approximately 5 cc) with mild surrounding edema. There is no midline shift or other significant mass effect. No acute infarct is identified separate from the hemorrhage. There is no extra-axial fluid collection. The ventricles are normal in size. Vascular: No hyperdense vessel. Skull: No fracture or focal osseous lesion. Sinuses/Orbits: Paranasal sinuses and mastoid  air cells are clear. Bilateral cataract extraction. Other: None. ASPECTS [facility] Stroke Program Early CT Score) Not scored due to the presence of acute hemorrhage. IMPRESSION: 2.6 cm acute left parietal parenchymal hemorrhage. Mild edema without mass effect. Critical Value/emergent results were called by telephone at the time of interpretation on 07/14/2019 at 11:56 a.m. to Dr. Madalyn Rob, who verbally acknowledged these results. Electronically Signed   By: Logan Bores M.D.   On: 07/14/2019 12:01    Procedures .Critical Care Performed by: Lucrezia Starch, MD Authorized by: Lucrezia Starch, MD   Critical care provider statement:    Critical care time (minutes):  50   Critical care time was exclusive of:  Separately billable procedures and treating other patients   Critical care was necessary to treat or prevent imminent or life-threatening deterioration of the following conditions:  Respiratory failure and CNS failure or compromise   Critical care was time spent personally by me on the following  activities:  Discussions with consultants, evaluation of patient's response to treatment, examination of patient, ordering and performing treatments and interventions, ordering and review of laboratory studies, ordering and review of radiographic studies, pulse oximetry, re-evaluation of patient's condition, obtaining history from patient or surrogate and review of old charts  Procedure Name: Intubation Date/Time: 07/14/2019 2:25 PM Performed by: Lucrezia Starch, MD Pre-anesthesia Checklist: Patient identified, Patient being monitored, Emergency Drugs available, Timeout performed and Suction available Oxygen Delivery Method: Ambu bag Preoxygenation: Pre-oxygenation with 100% oxygen Induction Type: Rapid sequence Ventilation: Mask ventilation without difficulty Laryngoscope Size: Mac, Glidescope and 3 Grade View: Grade I Tube size: 7.5 mm Number of attempts: 1 Airway Equipment and Method: Stylet (cric pressure) Placement Confirmation: ETT inserted through vocal cords under direct vision,  CO2 detector and Breath sounds checked- equal and bilateral Secured at: 24 cm Tube secured with: ETT holder      (including critical care time)  Medications Ordered in ED Medications  fentaNYL 2563mg in NS 2541m(1084mml) infusion-PREMIX (25 mcg/hr Intravenous New Bag/Given 07/14/19 1202)  fentaNYL (SUBLIMAZE) bolus via infusion 25 mcg (has no administration in time range)  propofol (DIPRIVAN) 1000 MG/100ML infusion (33.512 mcg/kg/min  74.6 kg Intravenous New Bag/Given 07/14/19 1217)   stroke: mapping our early stages of recovery book (has no administration in time range)  acetaminophen (TYLENOL) tablet 650 mg (has no administration in time range)    Or  acetaminophen (TYLENOL) solution 650 mg (has no administration in time range)    Or  acetaminophen (TYLENOL) suppository 650 mg (has no administration in time range)  senna-docusate (Senokot-S) tablet 1 tablet (1 tablet Oral Not Given 07/14/19  1300)  pantoprazole (PROTONIX) injection 40 mg (has no administration in time range)  clevidipine (CLEVIPREX) infusion 0.5 mg/mL (0 mg/hr Intravenous Hold 07/14/19 1249)  levETIRAcetam (KEPPRA) IVPB 500 mg/100 mL premix (has no administration in time range)  0.9 %  sodium chloride infusion ( Intravenous New Bag/Given 07/14/19 1249)  naloxone (NARCAN) 0.4 MG/ML injection (  Given 07/14/19 1052)  etomidate (AMIDATE) injection (30 mg Intravenous Given 07/14/19 1059)  succinylcholine (ANECTINE) injection (150 mg Intravenous Given 07/14/19 1100)  levETIRAcetam (KEPPRA) 2,000 mg in sodium chloride 0.9 % 100 mL IVPB (0 mg Intravenous Stopped 07/14/19 1232)  iohexol (OMNIPAQUE) 350 MG/ML injection 100 mL (100 mLs Intravenous Contrast Given 07/14/19 1153)     Initial Impression / Assessment and Plan / ED Course  I have reviewed the triage vital signs and the nursing notes.  Pertinent labs & imaging results that  were available during my care of the patient were reviewed by me and considered in my medical decision making (see chart for details).  Clinical Course as of Jul 13 1302  Sun Jul 14, 2019  1131 Going to scanner now   [RD]  1135 Discussed with intensivist who will come see patient   [RD]  1136 Reviewed CT, discussed with neurology, will get CTA, do not need NSGY consult, no surgical candidate; requests keppra load 2g IV; they will get stat eeg   [RD]  1217 Updated wife in person regarding patient's condition, intubation, head bleed and ICU admission   [RD]    Clinical Course User Index [RD] Lucrezia Starch, MD       68 year old male presents the ER with altered mental status, while in triage had a seizure-like episode and went unresponsive.  On my assessment patient initially was unresponsive, he transiently seemed to wake up and had movement in all 4 extremities, but was very altered, agitated.  Made stroke alert, proceeded with RSI.  Patient then taken to CT scanner where there is  concern for hemorrhagic stroke.  Neurology evaluated at bedside, they will admit to the neuro ICU.  Critical care consulted as well.  Blood pressure stable, not on anticoagulation, no acute neurosurgical intervention needed per neurology team.  Cleviprex for BP control per neuro. Given Keppra for seizure prophylaxis.  Dr. Chase Caller will admit.  Updated wife in person regarding patient's course and current status.  Final Clinical Impressions(s) / ED Diagnoses   Final diagnoses:  Hemorrhagic stroke ([place])  Acute encephalopathy  Acute respiratory failure, unspecified whether with hypoxia or hypercapnia (H[facility])  Intraparenchymal hemorrhage of brain [facility])    ED Discharge Orders    None       Lucrezia Starch, MD 07/14/19 1427

## 2019-07-14 NOTE — Progress Notes (Addendum)
MRI attempted to get pt RN unable to come.

## 2019-07-14 NOTE — ED Notes (Signed)
EEG at bedside.

## 2019-07-14 NOTE — H&P (Signed)
Stroke Neurology H&P Note  The history was obtained from the wife and RN.  During history and examination, all items were able to obtain unless otherwise noted.  History of Present Illness:  Thomas Norman is a 68 y.o. Caucasian male with PMH of HTN, HLD presented as aphasia.   Patient woke up this morning in his normal state.  He went to garage to fix his mom's car and then around 9:30 AM he came back in the house seemed to have something wrong.  Wife found him have difficulty get words out, not able to state what happened but seemed to understanding questions.  Wife told him to [facility] ER.  In route, wife found him to have difficulty even understanding questions.  In ER, patient was found to have starting off followed by whole body tensing up and he fell to the ground.  Patient was rushed to ER room and then he started to waking up but became combative, kicking hitting.  He was intubated for airway protection and agitation.  After that BP found to be in 200s, gradually resolved after propofol and fentanyl.  Stat CT showed left parietal small hemorrhage.  Patient has a history of hypertension and hyperlipidemia.  On ARB at home for BP control.  He had recent regular checkup with PCP and was told everything was good.  LSN: 9:30 AM tPA Given: No: ICH  Past Medical History:  Diagnosis Date  . Hypertension   . Kidney calculi     Past Surgical History:  Procedure Laterality Date  . LITHOTRIPSY    . skin cancers    . TONSILLECTOMY      Family History  Problem Relation Age of Onset  . Diabetes Mother   . Dementia Mother     Social History:  reports that he has never smoked. He has never used smokeless tobacco. He reports current alcohol use of about 1.0 standard drinks of alcohol per week. He reports that he does not use drugs.  Allergies:  Allergies  Allergen Reactions  . Sulfonamide Derivatives Rash    No current facility-administered medications on file prior to encounter.     Current Outpatient Medications on File Prior to Encounter  Medication Sig Dispense Refill  . Coenzyme Q10 (CO Q 10 PO) Take 1 tablet by mouth daily.    . fish oil-omega-3 fatty acids 1000 MG capsule Take 1 g by mouth daily.    Marland Kitchen telmisartan (MICARDIS) 40 MG tablet Take 40 mg by mouth daily before breakfast.      Review of Systems: A full ROS was attempted today and was  able to be performed.  Systems assessed include - Constitutional, Eyes, HENT, Respiratory, Cardiovascular, Gastrointestinal, Genitourinary, Integument/breast, Hematologic/lymphatic, Musculoskeletal, Neurological, Behavioral/Psych, Endocrine, Allergic/Immunologic - with pertinent responses as per HPI.  Physical Examination: Temp:  [97.8 F (36.6 C)] 97.8 F (36.6 C) (09/20 1103) Pulse Rate:  [80-106] 80 (09/20 1210) Resp:  [18-28] 18 (09/20 1210) BP: (96-206)/(67-137) 97/71 (09/20 1210) SpO2:  [97 %-99 %] 99 % (09/20 1210) FiO2 (%):  [100 %] 100 % (09/20 1102) Weight:  [74.6 kg] 74.6 kg (09/20 1103)  General - Well nourished, well developed, intubated on sedation.  Ophthalmologic - fundi not visualized due to noncooperation.  Cardiovascular - Regular rate and rhythm.  Neuro - intubated on sedation, eyes closed, not following commands. With forced eye opening, eyes in mid position, not blinking to visual threat, doll's eyes absent, not tracking, pupil equal but sluggish to light. Corneal reflex  absent, gag and cough present. Breathing over the vent.  Facial symmetry not able to test due to ET tube.  Tongue protrusion not cooperative. On pain stimulation, not moving all extremities. DTR diminished and no babinski. Sensation, coordination and gait not tested.   Data Reviewed: Ct Angio Head W Or Wo Contrast  Result Date: 07/14/2019 CLINICAL DATA:  Follow-up intracranial hemorrhage. EXAM: CT ANGIOGRAPHY HEAD AND NECK TECHNIQUE: Multidetector CT imaging of the head and neck was performed using the standard protocol during  bolus administration of intravenous contrast. Multiplanar CT image reconstructions and MIPs were obtained to evaluate the vascular anatomy. Carotid stenosis measurements (when applicable) are obtained utilizing NASCET criteria, using the distal internal carotid diameter as the denominator. CONTRAST:  169mL OMNIPAQUE IOHEXOL 350 MG/ML SOLN COMPARISON:  None. FINDINGS: CTA NECK FINDINGS Aortic arch: Widely patent arch vessel origins. Common origin of the left subclavian artery and left vertebral artery from the arch with an ectatic/fusiform dilated appearance of the common vessel measuring 2 cm in diameter and with a normal caliber of the vertebral and subclavian arteries more distally. No saccular aneurysm. Right carotid system: Patent and smooth without evidence of stenosis or dissection. Left carotid system: Patent and smooth without evidence of stenosis or dissection. Vertebral arteries: Patent and smooth without evidence of stenosis or dissection. Mildly dominant right vertebral artery. Skeleton: Moderate cervical disc and facet degeneration. Other neck: No evidence of cervical lymphadenopathy or mass. Endotracheal and enteric tubes in place with the former terminating well above the carina. Upper chest: Clear lung apices. Review of the MIP images confirms the above findings CTA HEAD FINDINGS Anterior circulation: The internal carotid arteries are widely patent from skull base to carotid termini. ACAs and MCAs are patent without evidence of proximal branch occlusion or significant proximal stenosis. No aneurysm or vascular malformation is identified. Posterior circulation: The intracranial vertebral arteries are widely patent to the basilar. A patent left PICA and bilateral SCAs are visualized. AICAs and a right PICA are not clearly identified. The basilar artery is widely patent. There is a small left posterior communicating artery. Both PCAs are patent without evidence of significant stenosis. No aneurysm or  vascular malformation is identified. Venous sinuses: Not well evaluated due to arterial phase contrast timing. Anatomic variants: None. Review of the MIP images confirms the above findings IMPRESSION: No large vessel occlusion, stenosis, or significant atherosclerosis in the head and neck. Electronically Signed   By: Logan Bores M.D.   On: 07/14/2019 12:14   Ct Angio Neck W Or Wo Contrast  Result Date: 07/14/2019 CLINICAL DATA:  Follow-up intracranial hemorrhage. EXAM: CT ANGIOGRAPHY HEAD AND NECK TECHNIQUE: Multidetector CT imaging of the head and neck was performed using the standard protocol during bolus administration of intravenous contrast. Multiplanar CT image reconstructions and MIPs were obtained to evaluate the vascular anatomy. Carotid stenosis measurements (when applicable) are obtained utilizing NASCET criteria, using the distal internal carotid diameter as the denominator. CONTRAST:  168mL OMNIPAQUE IOHEXOL 350 MG/ML SOLN COMPARISON:  None. FINDINGS: CTA NECK FINDINGS Aortic arch: Widely patent arch vessel origins. Common origin of the left subclavian artery and left vertebral artery from the arch with an ectatic/fusiform dilated appearance of the common vessel measuring 2 cm in diameter and with a normal caliber of the vertebral and subclavian arteries more distally. No saccular aneurysm. Right carotid system: Patent and smooth without evidence of stenosis or dissection. Left carotid system: Patent and smooth without evidence of stenosis or dissection. Vertebral arteries: Patent and smooth without  evidence of stenosis or dissection. Mildly dominant right vertebral artery. Skeleton: Moderate cervical disc and facet degeneration. Other neck: No evidence of cervical lymphadenopathy or mass. Endotracheal and enteric tubes in place with the former terminating well above the carina. Upper chest: Clear lung apices. Review of the MIP images confirms the above findings CTA HEAD FINDINGS Anterior  circulation: The internal carotid arteries are widely patent from skull base to carotid termini. ACAs and MCAs are patent without evidence of proximal branch occlusion or significant proximal stenosis. No aneurysm or vascular malformation is identified. Posterior circulation: The intracranial vertebral arteries are widely patent to the basilar. A patent left PICA and bilateral SCAs are visualized. AICAs and a right PICA are not clearly identified. The basilar artery is widely patent. There is a small left posterior communicating artery. Both PCAs are patent without evidence of significant stenosis. No aneurysm or vascular malformation is identified. Venous sinuses: Not well evaluated due to arterial phase contrast timing. Anatomic variants: None. Review of the MIP images confirms the above findings IMPRESSION: No large vessel occlusion, stenosis, or significant atherosclerosis in the head and neck. Electronically Signed   By: Logan Bores M.D.   On: 07/14/2019 12:14   Dg Chest Portable 1 View  Result Date: 07/14/2019 CLINICAL DATA:  Pt here from home with c/o aloc , pt had a seizure at the check in desk rushed back to trauma B ,Post intubation EXAM: PORTABLE CHEST 1 VIEW COMPARISON:  None. FINDINGS: Endotracheal tube tip projects 5.6 cm above the carina. Nasal/orogastric tube passes below the diaphragm within the stomach. Cardiac silhouette is normal in size. No mediastinal or hilar masses. Clear lungs.  No pleural effusion pneumothorax. Skeletal structures are grossly intact. IMPRESSION: 1. Endotracheal tube tip 5.6 cm above the carina. 2. Well-positioned nasogastric tube. 3. No acute cardiopulmonary disease. Electronically Signed   By: Lajean Manes M.D.   On: 07/14/2019 11:50   Ct Head Code Stroke Wo Contrast  Result Date: 07/14/2019 CLINICAL DATA:  Code stroke. Altered level of consciousness. Seizure. EXAM: CT HEAD WITHOUT CONTRAST TECHNIQUE: Contiguous axial images were obtained from the base of the  skull through the vertex without intravenous contrast. COMPARISON:  None. FINDINGS: Brain: An acute parenchymal hemorrhage in the posterior left parietal lobe measures 2.4 x 2.6 x 1.5 cm (approximately 5 cc) with mild surrounding edema. There is no midline shift or other significant mass effect. No acute infarct is identified separate from the hemorrhage. There is no extra-axial fluid collection. The ventricles are normal in size. Vascular: No hyperdense vessel. Skull: No fracture or focal osseous lesion. Sinuses/Orbits: Paranasal sinuses and mastoid air cells are clear. Bilateral cataract extraction. Other: None. ASPECTS [facility] Stroke Program Early CT Score) Not scored due to the presence of acute hemorrhage. IMPRESSION: 2.6 cm acute left parietal parenchymal hemorrhage. Mild edema without mass effect. Critical Value/emergent results were called by telephone at the time of interpretation on 07/14/2019 at 11:56 a.m. to Dr. Madalyn Rob, who verbally acknowledged these results. Electronically Signed   By: Logan Bores M.D.   On: 07/14/2019 12:01    Assessment: 68 y.o. male with PMH of HTN, HLD presented as aphasia followed by seizure in ER.  He was intubated and sedated.  BP under control after propofol and fentanyl.  CT showed left parietal small ICH.  CTA head and neck no AVM.  Currently BP well controlled, will put on Cleviprex PRN for BP goal less than 140.  Will load Keppra 2 g once.  Stat  EEG.  Admit to ICU.  Further investigation with MRI with and without contrast.  Stroke Risk Factors - hyperlipidemia and hypertension  Plan:  Admit to ICU  CCM consult for vent management  Keppra 2 g load followed by 500 mg twice daily  Cleviprex PRN for BP goal less than 140  Stat EEG  MRI with and without contrast  Supportive care  SCDs  GI prophylaxis  I had long discussion with wife at bedside and daughter over the phone, updated pt current condition, treatment plan and potential prognosis.  They expressed understanding and appreciation.   This patient is critically ill due to Thomas Norman, seizure, intubation and at significant risk of neurological worsening, death form hematoma expansion, status epilepticus, respiratory failure. This patient's care requires constant monitoring of vital signs, hemodynamics, respiratory and cardiac monitoring, review of multiple databases, neurological assessment, discussion with family, other specialists and medical decision making of high complexity. I spent 50 minutes of neurocritical care time in the care of this patient.  Rosalin Hawking, MD PhD Stroke Neurology 07/14/2019 12:44 PM

## 2019-07-14 NOTE — Progress Notes (Signed)
RT note: RT advanced et tube 2cm to 25 at the lip per MD order.

## 2019-07-14 NOTE — Progress Notes (Signed)
Chaplain responded to request for support for wife following pt's seizure in ED waiting area.  Wife Thomas Norman) reports that she has been the one with health issues for the last two years and that pt is the one who has been her support-system.  He has always been very healthy.  Wife has felt extremely anxious and disoriented.  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  Airline pilot, provided water and emotional support and orientation to phone system.  Chaplain later checked in on wife following pt admission to [facility].  Daughter Thomas Norman) and Allie's fiance ([name]) arrived from [place], [place].  They want to take her to eat and rest, though wife is weighing options as she knows she would not be able to return today.   Wife states she would definitely need an escort back down to meet her daughter with fiance (who are currently in Consult A near ED waiting area).    Please contact as needed.  [name]    07/14/19 1050  Clinical Encounter Type  Visited With Patient and family together  Visit Type Initial;Critical Care  Referral From Other (Comment) (Triage)  Consult/Referral To Chaplain  Spiritual Encounters  Spiritual Needs Emotional  Stress Factors  Patient Stress Factors Health changes  Family Stress Factors Lack of knowledge;Loss of control

## 2019-07-14 NOTE — Progress Notes (Signed)
[facility] Progress Note Patient Name: Thomas Norman DOB: [birth year] MRN: [id number]   Date of Service  07/14/2019  HPI/Events of Note  Agtitation - unable to do MRI. BP = 106/72.  eICU Interventions  Will order: 1. Versed 2 mg IV X 1 now.  2. Titrate Fentanyl and Propofol IV infusions as needed.      Intervention Category Major Interventions: Delirium, psychosis, severe agitation - evaluation and management  Sommer,Steven Eugene 07/14/2019, 10:38 PM

## 2019-07-14 NOTE — Procedures (Signed)
Patient Name: Thomas Norman  MRN: [id number]  Epilepsy Attending: Lora Havens  Referring Physician/Provider: Dr Rosalin Hawking Date: 07/14/2019  Duration: 21.28 mins  Patient history: 68yo M with left IPH. EEG to evaluate for seizure  Level of alertness: comatose/sedated  AEDs during EEG study: propofol, LEV  Technical aspects: This EEG study was done with scalp electrodes positioned according to the 10-20 International system of electrode placement. Electrical activity was acquired at a sampling rate of 500Hz  and reviewed with a high frequency filter of 70Hz  and a low frequency filter of 1Hz . EEG data were recorded continuously and digitally stored.   DESCRIPTION: EEG showed left frontal spikes, maximal FP1/F7. There was also continuous generalized 2-5hz  theta-delta slowing intermixed with an excessive amount of 15 to 18 Hz, 2-3 uV beta activity with irregular morphology distributed symmetrically and diffusely.  Hyperventilation and photic stimulation were not performed.  ABNORMALITY: 1. Spikes, left frontal, maximal FP1/F7 2. Continuous slow, generalized 3. Excessive beta, generalized   IMPRESSION: This study showed evidence of epileptogenicity in left frontal area. There was also severe diffuse encephalopathy, which could be secondary to sedated state. No seizures were seen throughout the recording.  The excessive beta activity seen in the background is most likely due to the effect of medications like benzodiazepine and is a benign EEG pattern.  Thomas Norman

## 2019-07-14 NOTE — Progress Notes (Signed)
[facility] Progress Note Patient Name: Thomas Norman DOB: [birth year] MRN: [id number]   Date of Service  07/14/2019  HPI/Events of Note  Oliguria - Bladder residual > 1000 mL. Request for I/O cath PRN.   eICU Interventions  Will order: 1. I/O Cath PRN     Intervention Category Intermediate Interventions: Oliguria - evaluation and management  Sommer,Steven Eugene 07/14/2019, 9:20 PM

## 2019-07-14 NOTE — Progress Notes (Signed)
Hypoglycemic (66), given 1/2 amp of D50 with increase to 136. Continuing to monitor CBGs q4

## 2019-07-14 NOTE — Progress Notes (Signed)
Wife is taking all patient belongings home with her. They include: wallet with cash, ring, necklace, clothes, shoes, keys, and pockey knife. Eyeglasses are at the bedside in case they are needed. Hocutt, Rande Brunt, RN

## 2019-07-14 NOTE — H&P (Signed)
NAME:  Thomas Norman, MRN:  [id number], DOB:  July 06, 1951, LOS: 0 ADMISSION DATE:  07/14/2019, CONSULTATION DATE:  07/14/2019 REFERRING MD:  ED, CHIEF COMPLAINT:  Hypoxemia / Airway Protection S/P Seizure  Brief History   Pt. With PMH  of HTN and hyperlipidemia presented to the ED  On 07/14/2019 from home. He had a seizure at the check in desk and was rushed to Trauma B where he became combative. He  was intubated for airway protection. BP was found to be [id number] + systolic. Code Stroke was called. CT head was positive for a 2.6 cm acute left parietal parenchymal hemorrhage. Mild edema without mass effect  PCCM have been consulted  to admit pt for critical care management.  History of present illness   Pt. Presented to the ED  On 07/14/2019 from home. Per his wife, he was working in his garage on his mother;s farm tractor when developed slurred speech and difficulty processing what she was asking him. She gave him 2 aspirin and brought him to the ED. She states he did not indicate that he had a fall of any kind or  head trauma when she asked him. He had a seizure at the check in desk and was rushed to Trauma B where he was intubated for airway protection.Code Stroke was called. CT head was positive for a 2.6 cm acute left parietal parenchymal hemorrhage. Mild edema without mass effect.  CTA head and neck showed no AVM.  In the ED CXR was negative for any acute cardiopulmonary disease> Pt was loaded  With  Keppra  for seizures and started on Cleviprex for BP control. Propofol and Fentanyl were initiated as sedation. EEG and MR with and without contrast are pending for further prognostication. ABG with Respiratory alkalosis. Rate decreased and repeat ABG pending.PCCM have been consulted  to admit pt for critical care management.  Per wife, pt is very active and they wish for full aggressive care. She is open to continued goals of care conversations as additional diagnostics are evaluated  and as patient  condition declares itself.   Past Medical History   Past Medical History:  Diagnosis Date  . Hypertension   . Kidney calculi      Significant Hospital Events   9/20>> Admission 9/20>> ETT  Consults:  9/20>> Neuro  Procedures:    Significant Diagnostic Tests:  07/14/2019 CT Head>>  acute parenchymal hemorrhage in the posterior left parietal lobe measures 2.4 x 2.6 x 1.5 cm (approximately 5 cc) with mild surrounding edema. There is no midline shift or other significant mass effect. No acute infarct is identified separate from the hemorrhage. There is no extra-axial fluid collection. The ventricles are normal in size.  9/20 CTA Head and Neck >> No large vessel occlusion, stenosis, or significant atherosclerosis in the head and neck.  07/14/2019 MR Brain   9/20 EEG ABNORMALITY: 1. Spikes, left frontal, maximal FP1/F7 2. Continuous slow, generalized 3. Excessive beta, generalized   IMPRESSION: This study showed evidence of epileptogenicity in left frontal area. There was also severe diffuse encephalopathy, which could be secondary to sedated state. No seizures were seen throughout the recording.  The excessive beta activity seen in the background is most likely due to the effect of medications like benzodiazepine and is a benign EEG pattern.   Micro Data:  07/14/2019 SARS Coronavirus>>  Antimicrobials:   None  Interim history/subjective:  Pt. Currently on Fentanyl and Propofol for sedation  Objective   Blood pressure (!) 191/103,  pulse (!) 106, temperature 97.8 F (36.6 C), temperature source Temporal, resp. rate 19, height 6' (1.829 m), weight 74.6 kg, SpO2 97 %.    Vent Mode: PRVC FiO2 (%):  [100 %] 100 % Set Rate:  [18 bmp] 18 bmp Vt Set:  [620 mL] 620 mL PEEP:  [5 cmH20] 5 cmH20 Plateau Pressure:  [13 cmH20] 13 cmH20  No intake or output data in the 24 hours ending 07/14/19 1217 Filed Weights   07/14/19 1103  Weight: 74.6 kg    Examination:  General: 68 year old male supine on stretcher, sedated and intubated, follows no commands HENT:NCAT, No LAD, ETT, OGT, No JVD  Lungs: Bilateral chest excursion , Clear throughout, diminished per bases, breathing over vent Cardiovascular: S1, S2, RRR, No RMG Abdomen: Soft, NT, ND, BS diminished Extremities: No obvious deformities, brisk refill Neuro: Sedated and intubated, Follows no commands, Does not move all extremities to pain  GU: Foley Cath  Resolved Hospital Problem list     Assessment & Plan:  Acute Small left parietal  Hemorrhage  2.4 x 2.6 x 1.5 cm (approximately 5 cc) with mild surrounding edema. There is no midline shift or other significant mass effect. 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 Following no commands  Plan Per Neuro Stroke Team CT Head completed Keppra load and maintenance Cleviprex PRN for BP goal less than 140 Stat EEG MRI with and without contrast Neuro assessments per unit protocol Trend Coags No heparin   Acute Respiratory Failure , inability to protect Airway SP  acute parenchymal hemorrhage in the posterior left parietal lobe  and seizure Plan Full ventilator support for now Fentanyl and propofol for sedation RASS  Goal  -1 Neuro status is barrier to weaning at present VAP precautions Titrate oxygen for saturations > 92% CXR 9/21 and prn ABG at 1600 today  Hypertension Per wife this has been well managed Hemodynamically stable no pressors Plan Hold home Micardis Cleviprex PRN for BP goal less than 140 Fentanyl and propofol for sedation agitation  Renal Plan Obtain Mag BMET daily Replete electrolytes prn   GI Plan Protonix per tube for SUP Dietitian to initiate TF   Best practice:  Diet: NPO Pain/Anxiety/Delirium protocol (if indicated): Fentanyl and Propofol VAP protocol (if indicated): initiated DVT prophylaxis: SCD>> NO heparin/ blood thinners GI prophylaxis: Protonix per tube Glucose  control: CBG with SSI Mobility: BR Code Status: Full Family Communication: Wife updated 9/20 in the Ed Disposition: ICU  Labs   CBC: Recent Labs  Lab 07/14/19 1104 07/14/19 1115  WBC 9.2  --   NEUTROABS 4.6  --   HGB 16.0 16.0  HCT 49.8 47.0  MCV 96.7  --   PLT 252  --     Basic Metabolic Panel: Recent Labs  Lab 07/14/19 1104 07/14/19 1115  NA 147* 147*  K 3.7 3.6  CL 111 114*  CO2 14*  --   GLUCOSE 114* 105*  BUN 16 19  CREATININE 1.22 1.00  CALCIUM 9.9  --    GFR: Estimated Creatinine Clearance: 76.7 mL/min (by C-G formula based on SCr of 1 mg/dL). Recent Labs  Lab 07/14/19 1104  WBC 9.2    Liver Function Tests: Recent Labs  Lab 07/14/19 1104  AST 24  ALT 15  ALKPHOS 101  BILITOT 0.8  PROT 7.3  ALBUMIN 4.3   No results for input(s): LIPASE, AMYLASE in the last 168 hours. No results for input(s): AMMONIA in the  last 168 hours.  ABG    Component Value Date/Time   TCO2 16 (L) 07/14/2019 1115     Coagulation Profile: Recent Labs  Lab 07/14/19 1104  INR 1.2    Cardiac Enzymes: No results for input(s): CKTOTAL, CKMB, CKMBINDEX, TROPONINI in the last 168 hours.  HbA1C: No results found for: HGBA1C  CBG: No results for input(s): GLUCAP in the last 168 hours.  Review of Systems:   Unable as patient is intubated and sedated  Past Medical History  He,  has a past medical history of Hypertension and Kidney calculi.   Surgical History    Past Surgical History:  Procedure Laterality Date  . LITHOTRIPSY    . skin cancers    . TONSILLECTOMY       Social History   reports that he has never smoked. He has never used smokeless tobacco. He reports current alcohol use of about 1.0 standard drinks of alcohol per week. He reports that he does not use drugs.   Family History   His family history includes Dementia in his mother; Diabetes in his mother.   Allergies Allergies  Allergen Reactions  . Sulfonamide Derivatives Rash     Home  Medications  Prior to Admission medications   Medication Sig Start Date End Date Taking? Authorizing Provider  ASTAXANTHIN PO Take 1 tablet by mouth daily.    [provider]  Coenzyme Q10 (CO Q 10 PO) Take 1 tablet by mouth daily.    [provider]  fish oil-omega-3 fatty acids 1000 MG capsule Take 1 g by mouth daily.    [provider]  telmisartan (MICARDIS) 40 MG tablet Take 40 mg by mouth daily before breakfast.    [provider]     Critical care time: 67 minutes    Magdalen Spatz, MSN, AGACNP-BC [place] Pager # [phone] After 4 pm please call [phone] 07/14/2019 2:26 PM

## 2019-07-14 NOTE — ED Triage Notes (Signed)
Pt here from home with c/o aloc , pt had a seizure at the check in desk rushed back to trauma B ,

## 2019-07-14 NOTE — ED Notes (Signed)
ED TO INPATIENT HANDOFF REPORT  ED Nurse Name and Phone #: Burman Nieves [phone]  S Name/Age/Gender Thomas Norman 68 y.o. male Room/Bed: TRABC/TRABC  Code Status   Code Status: Full Code  Home/SNF/Other Home  Is this baseline? No   Triage Complete: Triage complete  Chief Complaint Stroke like symptoms  Triage Note Pt here from home with c/o aloc , pt had a seizure at the check in desk rushed back to trauma B ,    Allergies Allergies  Allergen Reactions  . Sulfonamide Derivatives Rash    Level of Care/Admitting Diagnosis ED Disposition    ED Disposition Condition [facility] Area: [place] [[id number]]  Level of Care: ICU [6]  Covid Evaluation: Confirmed COVID Negative  Diagnosis: Hemorrhagic stroke [facility]) [id number]  Admitting Physician: Brand Males [phone]  Attending Physician: Brand Males [phone]  Estimated length of stay: 5 - 7 days  Certification:: I certify this patient will need inpatient services for at least 2 midnights  PT Class (Do Not Modify): Inpatient [101]  PT Acc Code (Do Not Modify): Private [1]       B Medical/Surgery History Past Medical History:  Diagnosis Date  . Hypertension   . Kidney calculi    Past Surgical History:  Procedure Laterality Date  . LITHOTRIPSY    . skin cancers    . TONSILLECTOMY       A IV Location/Drains/Wounds Patient Lines/Drains/Airways Status   Active Line/Drains/Airways    Name:   Placement date:   Placement time:   Site:   Days:   Peripheral IV 07/14/19 Left Antecubital   07/14/19    1049    Antecubital   less than 1   Peripheral IV 07/14/19 Right Antecubital   07/14/19    1049    Antecubital   less than 1   Peripheral IV 07/14/19 Right Hand   07/14/19    1200    Hand   less than 1   Airway 7.5 mm   07/14/19    1205     less than 1          Intake/Output Last 24 hours No intake or output data in the 24 hours ending 07/14/19 1359  Labs/Imaging Results for  orders placed or performed during the hospital encounter of 07/14/19 (from the past 48 hour(s))  Protime-INR     Status: None   Collection Time: 07/14/19 11:04 AM  Result Value Ref Range   Prothrombin Time 14.7 11.4 - 15.2 seconds   INR 1.2 0.8 - 1.2    Comment: (NOTE) INR goal varies based on device and disease states. Performed at [facility], [place] [address] [place], [place] [address]   APTT     Status: None   Collection Time: 07/14/19 11:04 AM  Result Value Ref Range   aPTT 28 24 - 36 seconds    Comment: Performed at [facility] [address], [place] [address]  CBC     Status: None   Collection Time: 07/14/19 11:04 AM  Result Value Ref Range   WBC 9.2 4.0 - 10.5 K/uL   RBC 5.15 4.22 - 5.81 MIL/uL   Hemoglobin 16.0 13.0 - 17.0 g/dL   HCT 49.8 39.0 - 52.0 %   MCV 96.7 80.0 - 100.0 fL   MCH 31.1 26.0 - 34.0 pg   MCHC 32.1 30.0 - 36.0 g/dL   RDW 12.1 11.5 - 15.5 %  Platelets 252 150 - 400 K/uL   nRBC 0.0 0.0 - 0.2 %    Comment: Performed at [facility], [place] [address], [place], [place] [address]  Differential     Status: None   Collection Time: 07/14/19 11:04 AM  Result Value Ref Range   Neutrophils Relative % 49 %   Neutro Abs 4.6 1.7 - 7.7 K/uL   Lymphocytes Relative 38 %   Lymphs Abs 3.5 0.7 - 4.0 K/uL   Monocytes Relative 9 %   Monocytes Absolute 0.8 0.1 - 1.0 K/uL   Eosinophils Relative 2 %   Eosinophils Absolute 0.2 0.0 - 0.5 K/uL   Basophils Relative 1 %   Basophils Absolute 0.1 0.0 - 0.1 K/uL   Immature Granulocytes 1 %   Abs Immature Granulocytes 0.06 0.00 - 0.07 K/uL    Comment: Performed at [facility] [address], [place], [place] [address]  Comprehensive metabolic panel     Status: Abnormal   Collection Time: 07/14/19 11:04 AM  Result Value Ref Range   Sodium 147 (H) 135 - 145 mmol/L   Potassium 3.7 3.5 - 5.1 mmol/L   Chloride 111 98 - 111 mmol/L   CO2 14 (L) 22 - 32 mmol/L   Glucose, Bld 114 (H) 70 - 99  mg/dL   BUN 16 8 - 23 mg/dL   Creatinine, Ser 1.22 0.61 - 1.24 mg/dL   Calcium 9.9 8.9 - 10.3 mg/dL   Total Protein 7.3 6.5 - 8.1 g/dL   Albumin 4.3 3.5 - 5.0 g/dL   AST 24 15 - 41 U/L   ALT 15 0 - 44 U/L   Alkaline Phosphatase 101 38 - 126 U/L   Total Bilirubin 0.8 0.3 - 1.2 mg/dL   GFR calc non Af Amer >60 >60 mL/min   GFR calc Af Amer >60 >60 mL/min   Anion gap 22 (H) 5 - 15    Comment: REPEATED TO VERIFY Performed at [facility], [place] [address] [place],  [address]   SARS Coronavirus 2 [facility] order, Performed in [facility] hospital lab) Nasopharyngeal Nasopharyngeal Swab     Status: None   Collection Time: 07/14/19 11:12 AM   Specimen: Nasopharyngeal Swab  Result Value Ref Range   SARS Coronavirus 2 NEGATIVE NEGATIVE    Comment: (NOTE) If result is NEGATIVE SARS-CoV-2 target nucleic acids are NOT DETECTED. The SARS-CoV-2 RNA is generally detectable in upper and lower  respiratory specimens during the acute phase of infection. The lowest  concentration of SARS-CoV-2 viral copies this assay can detect is 250  copies / mL. A negative result does not preclude SARS-CoV-2 infection  and should not be used as the sole basis for treatment or other  patient management decisions.  A negative result may occur with  improper specimen collection / handling, submission of specimen other  than nasopharyngeal swab, presence of viral mutation(s) within the  areas targeted by this assay, and inadequate number of viral copies  (<250 copies / mL). A negative result must be combined with clinical  observations, patient history, and epidemiological information. If result is POSITIVE SARS-CoV-2 target nucleic acids are DETECTED. The SARS-CoV-2 RNA is generally detectable in upper and lower  respiratory specimens dur ing the acute phase of infection.  Positive  results are indicative of active infection with SARS-CoV-2.  Clinical  correlation with patient history and other  diagnostic information is  necessary to determine patient infection status.  Positive results do  not rule out bacterial infection or co-infection with other viruses. If result is PRESUMPTIVE POSTIVE SARS-CoV-2 nucleic acids MAY BE PRESENT.   A presumptive positive result was obtained on the submitted specimen  and confirmed on repeat testing.  While 2019 novel coronavirus  (SARS-CoV-2) nucleic acids may be present in the submitted sample  additional confirmatory testing may be necessary for epidemiological  and / or clinical management purposes  to differentiate between  SARS-CoV-2 and other Sarbecovirus currently known to infect humans.  If clinically indicated additional testing with an alternate test  methodology [phone]) is advised. The SARS-CoV-2 RNA is generally  detectable in upper and lower respiratory sp ecimens during the acute  phase of infection. The expected result is Negative. Fact Sheet for Patients:  StrictlyIdeas.no Fact Sheet for Healthcare Providers: BankingDealers.co.za This test is not yet approved or cleared by the [place] FDA and has been authorized for detection and/or diagnosis of SARS-CoV-2 by FDA under an Emergency Use Authorization (EUA).  This EUA will remain in effect (meaning this test can be used) for the duration of the COVID-19 declaration under Section 564(b)(1) of the Act, 21 U.S.C. section 360bbb-3(b)(1), unless the authorization is terminated or revoked sooner. Performed at [facility], [place] [address], [place], [place] [address]   I-stat chem 8, ED     Status: Abnormal   Collection Time: 07/14/19 11:15 AM  Result Value Ref Range   Sodium 147 (H) 135 - 145 mmol/L   Potassium 3.6 3.5 - 5.1 mmol/L   Chloride 114 (H) 98 - 111 mmol/L   BUN 19 8 - 23 mg/dL   Creatinine, Ser 1.00 0.61 - 1.24 mg/dL   Glucose, Bld 105 (H) 70 - 99 mg/dL   Calcium, Ion 1.25 1.15 - 1.40 mmol/L   TCO2 16 (L)  22 - 32 mmol/L   Hemoglobin 16.0 13.0 - 17.0 g/dL   HCT 47.0 39.0 - 52.0 %  Urine rapid drug screen (hosp performed)     Status: None   Collection Time: 07/14/19 11:55 AM  Result Value Ref Range   Opiates NONE DETECTED NONE DETECTED   Cocaine NONE DETECTED NONE DETECTED   Benzodiazepines NONE DETECTED NONE DETECTED   Amphetamines NONE DETECTED NONE DETECTED   Tetrahydrocannabinol NONE DETECTED NONE DETECTED   Barbiturates NONE DETECTED NONE DETECTED    Comment: (NOTE) DRUG SCREEN FOR MEDICAL PURPOSES ONLY.  IF CONFIRMATION IS NEEDED FOR ANY PURPOSE, NOTIFY LAB WITHIN 5 DAYS. LOWEST DETECTABLE LIMITS FOR URINE DRUG SCREEN Drug Class                     Cutoff (ng/mL) Amphetamine and metabolites    1000 Barbiturate and metabolites    200 Benzodiazepine                 [id number] Tricyclics and metabolites     300 Opiates and metabolites        300 Cocaine and metabolites        300 THC                            50 Performed at [facility], [place] [address] [place], [place] [address]   Urinalysis, Routine w reflex microscopic     Status: Abnormal   Collection Time: 07/14/19 11:55 AM  Result Value Ref Range   Color, Urine YELLOW YELLOW   APPearance CLOUDY (A) CLEAR   Specific Gravity, Urine  1.016 1.005 - 1.030   pH 5.0 5.0 - 8.0   Glucose, UA NEGATIVE NEGATIVE mg/dL   Hgb urine dipstick LARGE (A) NEGATIVE   Bilirubin Urine NEGATIVE NEGATIVE   Ketones, ur NEGATIVE NEGATIVE mg/dL   Protein, ur 30 (A) NEGATIVE mg/dL   Nitrite NEGATIVE NEGATIVE   Leukocytes,Ua NEGATIVE NEGATIVE   RBC / HPF >50 (H) 0 - 5 RBC/hpf   WBC, UA 6-10 0 - 5 WBC/hpf   Bacteria, UA NONE SEEN NONE SEEN   Mucus PRESENT    Hyaline Casts, UA PRESENT    Sperm, UA PRESENT     Comment: Performed at [facility] [address], [place], [place] [address]  Triglycerides     Status: None   Collection Time: 07/14/19 11:55 AM  Result Value Ref Range   Triglycerides 141 <150 mg/dL    Comment: Performed  at [facility] [address], [place],  [address]  Ethanol     Status: None   Collection Time: 07/14/19 12:17 PM  Result Value Ref Range   Alcohol, Ethyl (B) <10 <10 mg/dL    Comment: (NOTE) Lowest detectable limit for serum alcohol is 10 mg/dL. For medical purposes only. Performed at [facility], [place] [address] [place], [place] [address]   I-STAT 7, (LYTES, BLD GAS, ICA, H+H)     Status: Abnormal   Collection Time: 07/14/19  1:16 PM  Result Value Ref Range   pH, Arterial 7.457 (H) 7.350 - 7.450   pCO2 arterial 30.7 (L) 32.0 - 48.0 mmHg   pO2, Arterial 422.0 (H) 83.0 - 108.0 mmHg   Bicarbonate 21.7 20.0 - 28.0 mmol/L   TCO2 23 22 - 32 mmol/L   O2 Saturation 100.0 %   Acid-base deficit 1.0 0.0 - 2.0 mmol/L   Sodium 138 135 - 145 mmol/L   Potassium 3.7 3.5 - 5.1 mmol/L   Calcium, Ion 1.21 1.15 - 1.40 mmol/L   HCT 36.0 (L) 39.0 - 52.0 %   Hemoglobin 12.2 (L) 13.0 - 17.0 g/dL   Patient temperature HIDE    Sample type ARTERIAL    Ct Angio Head W Or Wo Contrast  Result Date: 07/14/2019 CLINICAL DATA:  Follow-up intracranial hemorrhage. EXAM: CT ANGIOGRAPHY HEAD AND NECK TECHNIQUE: Multidetector CT imaging of the head and neck was performed using the standard protocol during bolus administration of intravenous contrast. Multiplanar CT image reconstructions and MIPs were obtained to evaluate the vascular anatomy. Carotid stenosis measurements (when applicable) are obtained utilizing NASCET criteria, using the distal internal carotid diameter as the denominator. CONTRAST:  144mL OMNIPAQUE IOHEXOL 350 MG/ML SOLN COMPARISON:  None. FINDINGS: CTA NECK FINDINGS Aortic arch: Widely patent arch vessel origins. Norman origin of the left subclavian artery and left vertebral artery from the arch with an ectatic/fusiform dilated appearance of the Norman vessel measuring 2 cm in diameter and with a normal caliber of the vertebral and subclavian arteries more distally. No saccular  aneurysm. Right carotid system: Patent and smooth without evidence of stenosis or dissection. Left carotid system: Patent and smooth without evidence of stenosis or dissection. Vertebral arteries: Patent and smooth without evidence of stenosis or dissection. Mildly dominant right vertebral artery. Skeleton: Moderate cervical disc and facet degeneration. Other neck: No evidence of cervical lymphadenopathy or mass. Endotracheal and enteric tubes in place with the former terminating well above the carina. Upper chest: Clear lung apices. Review of the MIP images confirms the above findings CTA HEAD FINDINGS Anterior circulation:  The internal carotid arteries are widely patent from skull base to carotid termini. ACAs and MCAs are patent without evidence of proximal branch occlusion or significant proximal stenosis. No aneurysm or vascular malformation is identified. Posterior circulation: The intracranial vertebral arteries are widely patent to the basilar. A patent left PICA and bilateral SCAs are visualized. AICAs and a right PICA are not clearly identified. The basilar artery is widely patent. There is a small left posterior communicating artery. Both PCAs are patent without evidence of significant stenosis. No aneurysm or vascular malformation is identified. Venous sinuses: Not well evaluated due to arterial phase contrast timing. Anatomic variants: None. Review of the MIP images confirms the above findings IMPRESSION: No large vessel occlusion, stenosis, or significant atherosclerosis in the head and neck. Electronically Signed   By: Logan Bores M.D.   On: 07/14/2019 12:14   Ct Angio Neck W Or Wo Contrast  Result Date: 07/14/2019 CLINICAL DATA:  Follow-up intracranial hemorrhage. EXAM: CT ANGIOGRAPHY HEAD AND NECK TECHNIQUE: Multidetector CT imaging of the head and neck was performed using the standard protocol during bolus administration of intravenous contrast. Multiplanar CT image reconstructions and MIPs  were obtained to evaluate the vascular anatomy. Carotid stenosis measurements (when applicable) are obtained utilizing NASCET criteria, using the distal internal carotid diameter as the denominator. CONTRAST:  190mL OMNIPAQUE IOHEXOL 350 MG/ML SOLN COMPARISON:  None. FINDINGS: CTA NECK FINDINGS Aortic arch: Widely patent arch vessel origins. Norman origin of the left subclavian artery and left vertebral artery from the arch with an ectatic/fusiform dilated appearance of the Norman vessel measuring 2 cm in diameter and with a normal caliber of the vertebral and subclavian arteries more distally. No saccular aneurysm. Right carotid system: Patent and smooth without evidence of stenosis or dissection. Left carotid system: Patent and smooth without evidence of stenosis or dissection. Vertebral arteries: Patent and smooth without evidence of stenosis or dissection. Mildly dominant right vertebral artery. Skeleton: Moderate cervical disc and facet degeneration. Other neck: No evidence of cervical lymphadenopathy or mass. Endotracheal and enteric tubes in place with the former terminating well above the carina. Upper chest: Clear lung apices. Review of the MIP images confirms the above findings CTA HEAD FINDINGS Anterior circulation: The internal carotid arteries are widely patent from skull base to carotid termini. ACAs and MCAs are patent without evidence of proximal branch occlusion or significant proximal stenosis. No aneurysm or vascular malformation is identified. Posterior circulation: The intracranial vertebral arteries are widely patent to the basilar. A patent left PICA and bilateral SCAs are visualized. AICAs and a right PICA are not clearly identified. The basilar artery is widely patent. There is a small left posterior communicating artery. Both PCAs are patent without evidence of significant stenosis. No aneurysm or vascular malformation is identified. Venous sinuses: Not well evaluated due to arterial phase  contrast timing. Anatomic variants: None. Review of the MIP images confirms the above findings IMPRESSION: No large vessel occlusion, stenosis, or significant atherosclerosis in the head and neck. Electronically Signed   By: Logan Bores M.D.   On: 07/14/2019 12:14   Dg Chest Portable 1 View  Result Date: 07/14/2019 CLINICAL DATA:  Pt here from home with c/o aloc , pt had a seizure at the check in desk rushed back to trauma B ,Post intubation EXAM: PORTABLE CHEST 1 VIEW COMPARISON:  None. FINDINGS: Endotracheal tube tip projects 5.6 cm above the carina. Nasal/orogastric tube passes below the diaphragm within the stomach. Cardiac silhouette is normal in size. No mediastinal  or hilar masses. Clear lungs.  No pleural effusion pneumothorax. Skeletal structures are grossly intact. IMPRESSION: 1. Endotracheal tube tip 5.6 cm above the carina. 2. Well-positioned nasogastric tube. 3. No acute cardiopulmonary disease. Electronically Signed   By: Lajean Manes M.D.   On: 07/14/2019 11:50   Ct Head Code Stroke Wo Contrast  Result Date: 07/14/2019 CLINICAL DATA:  Code stroke. Altered level of consciousness. Seizure. EXAM: CT HEAD WITHOUT CONTRAST TECHNIQUE: Contiguous axial images were obtained from the base of the skull through the vertex without intravenous contrast. COMPARISON:  None. FINDINGS: Brain: An acute parenchymal hemorrhage in the posterior left parietal lobe measures 2.4 x 2.6 x 1.5 cm (approximately 5 cc) with mild surrounding edema. There is no midline shift or other significant mass effect. No acute infarct is identified separate from the hemorrhage. There is no extra-axial fluid collection. The ventricles are normal in size. Vascular: No hyperdense vessel. Skull: No fracture or focal osseous lesion. Sinuses/Orbits: Paranasal sinuses and mastoid air cells are clear. Bilateral cataract extraction. Other: None. ASPECTS [facility] Stroke Program Early CT Score) Not scored due to the presence of acute  hemorrhage. IMPRESSION: 2.6 cm acute left parietal parenchymal hemorrhage. Mild edema without mass effect. Critical Value/emergent results were called by telephone at the time of interpretation on 07/14/2019 at 11:56 a.m. to Dr. Madalyn Rob, who verbally acknowledged these results. Electronically Signed   By: Logan Bores M.D.   On: 07/14/2019 12:01    Pending Labs Unresulted Labs (From admission, onward)    Start     Ordered   07/15/19 0500  Blood gas, arterial  Tomorrow morning,   R     07/14/19 1347   07/15/19 0500  CBC  Tomorrow morning,   R     07/14/19 1347   07/15/19 0500  Blood gas, arterial  Tomorrow morning,   R     07/14/19 1347   07/14/19 1600  Blood gas, arterial  Once,   R     07/14/19 1347   07/14/19 [id number]  Basic metabolic panel  Once,   STAT    Comments: Call for abnormal results    07/14/19 1347   07/14/19 1339  Magnesium  Once,   STAT     07/14/19 1347   07/14/19 1339  Phosphorus  Once,   STAT     07/14/19 1347   07/14/19 1147  HIV antibody (Routine Testing)  Once,   STAT     07/14/19 1149   07/14/19 1122  Triglycerides  (propofol (DIPRIVAN))  Every 72 hours,   R    Comments: While on propofol (DIPRIVAN)    07/14/19 1121          Vitals/Pain Today's Vitals   07/14/19 1300 07/14/19 1330 07/14/19 1340 07/14/19 1350  BP: 109/74 106/73 104/74 107/74  Pulse: 66 63 63 64  Resp: 18 18 18 18   Temp:      TempSrc:      SpO2: 100% 100% 100% 100%  Weight:      Height:      PainSc:        Isolation Precautions Airborne and Contact precautions  Medications Medications  fentaNYL 2513mcg in NS 233mL (108mcg/ml) infusion-PREMIX (25 mcg/hr Intravenous New Bag/Given 07/14/19 1202)  fentaNYL (SUBLIMAZE) bolus via infusion 25 mcg (has no administration in time range)  propofol (DIPRIVAN) 1000 MG/100ML infusion (33.512 mcg/kg/min  74.6 kg Intravenous New Bag/Given 07/14/19 1217)   stroke: mapping our early stages of recovery book (has no administration in  time  range)  acetaminophen (TYLENOL) solution 650 mg (has no administration in time range)    Or  acetaminophen (TYLENOL) suppository 650 mg (has no administration in time range)  senna-docusate (Senokot-S) tablet 1 tablet (1 tablet Oral Not Given 07/14/19 1300)  clevidipine (CLEVIPREX) infusion 0.5 mg/mL (0 mg/hr Intravenous Hold 07/14/19 1249)  levETIRAcetam (KEPPRA) IVPB 500 mg/100 mL premix (has no administration in time range)  0.9 %  sodium chloride infusion ( Intravenous New Bag/Given 07/14/19 1249)  0.9 %  sodium chloride infusion (has no administration in time range)  ondansetron (ZOFRAN) injection 4 mg (has no administration in time range)  pantoprazole sodium (PROTONIX) 40 mg/20 mL oral suspension 40 mg (has no administration in time range)  albuterol (PROVENTIL) (2.5 MG/3ML) 0.083% nebulizer solution 2.5 mg (has no administration in time range)  naloxone (NARCAN) 0.4 MG/ML injection (  Given 07/14/19 1052)  etomidate (AMIDATE) injection (30 mg Intravenous Given 07/14/19 1059)  succinylcholine (ANECTINE) injection (150 mg Intravenous Given 07/14/19 1100)  levETIRAcetam (KEPPRA) 2,000 mg in sodium chloride 0.9 % 100 mL IVPB (0 mg Intravenous Stopped 07/14/19 1232)  iohexol (OMNIPAQUE) 350 MG/ML injection 100 mL (100 mLs Intravenous Contrast Given 07/14/19 1153)    Mobility High fall risk  Focused Assessments Neuro Assessment Handoff:  Swallow screen pass? No    NIH Stroke Scale ( + Modified Stroke Scale Criteria)  Level of Consciousness (1a.)   : Alert, keenly responsive LOC Questions (1b. )   +: Answers neither question correctly LOC Commands (1c. )   + : Performs neither task correctly Best Gaze (2. )  +: Normal Visual (3. )  +: No visual loss Facial Palsy (4. )    : Normal symmetrical movements Motor Arm, Left (5a. )   +: No drift Motor Arm, Right (5b. )   +: No drift Motor Leg, Left (6a. )   +: No drift Motor Leg, Right (6b. )   +: No drift Limb Ataxia (7. ): Absent Sensory  (8. )   +: Normal, no sensory loss Best Language (9. )   +: Mild-to-moderate aphasia Dysarthria (10. ): Mild-to-moderate dysarthria, patient slurs at least some words and, at worst, can be understood with some difficulty Extinction/Inattention (11.)   +: No Abnormality Modified SS Total  +: 5 Complete NIHSS TOTAL: 6     Neuro Assessment: Exceptions to WDL Neuro Checks:      Last Documented NIHSS Modified Score: 5 (07/14/19 1100) Has TPA been given? No If patient is a Neuro Trauma and patient is going to OR before floor call report to [facility] nurse: [phone] or [phone]     R Recommendations: See Admitting Provider Note  Report given to:   Additional Notes:

## 2019-07-14 NOTE — ED Notes (Signed)
Delay in CT due to no CT open

## 2019-07-15 ENCOUNTER — Encounter (HOSPITAL_COMMUNITY): Payer: Self-pay

## 2019-07-15 ENCOUNTER — Inpatient Hospital Stay (HOSPITAL_COMMUNITY): Payer: Medicare Other

## 2019-07-15 DIAGNOSIS — E162 Hypoglycemia, unspecified: Secondary | ICD-10-CM

## 2019-07-15 DIAGNOSIS — I959 Hypotension, unspecified: Secondary | ICD-10-CM

## 2019-07-15 DIAGNOSIS — Z978 Presence of other specified devices: Secondary | ICD-10-CM

## 2019-07-15 DIAGNOSIS — I619 Nontraumatic intracerebral hemorrhage, unspecified: Secondary | ICD-10-CM

## 2019-07-15 LAB — GLUCOSE, CAPILLARY
Glucose-Capillary: 107 mg/dL — ABNORMAL HIGH (ref 70–99)
Glucose-Capillary: 109 mg/dL — ABNORMAL HIGH (ref 70–99)
Glucose-Capillary: 119 mg/dL — ABNORMAL HIGH (ref 70–99)
Glucose-Capillary: 122 mg/dL — ABNORMAL HIGH (ref 70–99)
Glucose-Capillary: 123 mg/dL — ABNORMAL HIGH (ref 70–99)
Glucose-Capillary: 124 mg/dL — ABNORMAL HIGH (ref 70–99)
Glucose-Capillary: 54 mg/dL — ABNORMAL LOW (ref 70–99)
Glucose-Capillary: 58 mg/dL — ABNORMAL LOW (ref 70–99)
Glucose-Capillary: 60 mg/dL — ABNORMAL LOW (ref 70–99)
Glucose-Capillary: 94 mg/dL (ref 70–99)
Glucose-Capillary: 96 mg/dL (ref 70–99)
Glucose-Capillary: 98 mg/dL (ref 70–99)

## 2019-07-15 LAB — CBC
HCT: 39.7 % (ref 39.0–52.0)
Hemoglobin: 13 g/dL (ref 13.0–17.0)
MCH: 30.1 pg (ref 26.0–34.0)
MCHC: 32.7 g/dL (ref 30.0–36.0)
MCV: 91.9 fL (ref 80.0–100.0)
Platelets: 170 10*3/uL (ref 150–400)
RBC: 4.32 MIL/uL (ref 4.22–5.81)
RDW: 12.3 % (ref 11.5–15.5)
WBC: 9.8 10*3/uL (ref 4.0–10.5)
nRBC: 0 % (ref 0.0–0.2)

## 2019-07-15 LAB — POCT I-STAT 7, (LYTES, BLD GAS, ICA,H+H)
Acid-base deficit: 2 mmol/L (ref 0.0–2.0)
Bicarbonate: 19.5 mmol/L — ABNORMAL LOW (ref 20.0–28.0)
Calcium, Ion: 1.17 mmol/L (ref 1.15–1.40)
HCT: 35 % — ABNORMAL LOW (ref 39.0–52.0)
Hemoglobin: 11.9 g/dL — ABNORMAL LOW (ref 13.0–17.0)
O2 Saturation: 100 %
Patient temperature: 99.5
Potassium: 3.7 mmol/L (ref 3.5–5.1)
Sodium: 140 mmol/L (ref 135–145)
TCO2: 20 mmol/L — ABNORMAL LOW (ref 22–32)
pCO2 arterial: 24.9 mmHg — ABNORMAL LOW (ref 32.0–48.0)
pH, Arterial: 7.503 — ABNORMAL HIGH (ref 7.350–7.450)
pO2, Arterial: 161 mmHg — ABNORMAL HIGH (ref 83.0–108.0)

## 2019-07-15 LAB — HIV ANTIBODY (ROUTINE TESTING W REFLEX): HIV Screen 4th Generation wRfx: NONREACTIVE

## 2019-07-15 MED ORDER — DEXTROSE 50 % IV SOLN
25.0000 g | INTRAVENOUS | Status: AC
  Administered 2019-07-15: 25 g via INTRAVENOUS

## 2019-07-15 MED ORDER — CHLORHEXIDINE GLUCONATE 0.12 % MT SOLN
15.0000 mL | Freq: Two times a day (BID) | OROMUCOSAL | Status: DC
  Administered 2019-07-15 – 2019-07-19 (×9): 15 mL via OROMUCOSAL
  Filled 2019-07-15 (×5): qty 15

## 2019-07-15 MED ORDER — ORAL CARE MOUTH RINSE
15.0000 mL | Freq: Two times a day (BID) | OROMUCOSAL | Status: DC
  Administered 2019-07-15 – 2019-07-18 (×7): 15 mL via OROMUCOSAL

## 2019-07-15 MED ORDER — DEXTROSE 50 % IV SOLN
1.0000 | Freq: Once | INTRAVENOUS | Status: AC
  Administered 2019-07-15: 50 mL via INTRAVENOUS

## 2019-07-15 MED ORDER — DEXTROSE 50 % IV SOLN
INTRAVENOUS | Status: AC
  Filled 2019-07-15: qty 50

## 2019-07-15 MED ORDER — DEXTROSE 10 % IV SOLN
INTRAVENOUS | Status: DC
  Administered 2019-07-15 – 2019-07-16 (×2): via INTRAVENOUS

## 2019-07-15 MED ORDER — DEXTROSE 50 % IV SOLN
12.5000 g | INTRAVENOUS | Status: AC
  Administered 2019-07-15: 12.5 g via INTRAVENOUS

## 2019-07-15 MED ORDER — CHLORHEXIDINE GLUCONATE CLOTH 2 % EX PADS
6.0000 | MEDICATED_PAD | Freq: Every day | CUTANEOUS | Status: DC
  Administered 2019-07-15 – 2019-07-18 (×3): 6 via TOPICAL

## 2019-07-15 NOTE — Evaluation (Signed)
Occupational Therapy Evaluation Patient Details Name: Thomas Norman MRN: [id number] DOB: 09-Mar-1951 Today's Date: 07/15/2019    History of Present Illness Thomas Norman is a 68 y.o. Caucasian male with PMH of HTN, HLD presented as aphasia.  He had seizure like activity in ED and was intubated,  CT/MRI showed small L parietal hemorrhage.  Patient extubated morning of 07/15/19.   Clinical Impression   PTA, pt was living with his wife and was independent. Pt currently requiring Min Guard-Min A for ADLs in standing, LB ADLs, and functional mobility with RW. Pt presenting with decreased cognition, balance, strength, and awareness. Pt also presenting with right inattention as seen during grooming at the sink and during mobility. Pt would benefit from further acute OT to facilitate safe dc. Recommend dc to home with HHOT for further OT to optimize safety, independence with ADLs, and return to PLOF.      Follow Up Recommendations  Home health OT;Supervision/Assistance - 24 hour(may progress to OP OT)    Equipment Recommendations  3 in 1 bedside commode    Recommendations for Other Services PT consult;Speech consult     Precautions / Restrictions Precautions Precautions: Fall Precaution Comments: mild R side inattention Restrictions Weight Bearing Restrictions: No      Mobility Bed Mobility Overal bed mobility: Needs Assistance Bed Mobility: Supine to Sit     Supine to sit: Supervision;HOB elevated     General bed mobility comments: assist for lines and safety  Transfers Overall transfer level: Needs assistance Equipment used: Rolling walker (2 wheeled) Transfers: Sit to/from Stand Sit to Stand: Min guard         General transfer comment: assist for balance, pt initially bracing with legs against edge of bed, then LOB and sat back on bed, gave RW for UE support with improved balance second attempt    Balance Overall balance assessment: Needs assistance   Sitting  balance-Leahy Scale: Good     Standing balance support: During functional activity;Bilateral upper extremity supported Standing balance-Leahy Scale: Poor Standing balance comment: LOB initially and sat back on bed without UE support, using some external support and LOB with teeth brushing in bathroom                           ADL either performed or assessed with clinical judgement   ADL Overall ADL's : Needs assistance/impaired Eating/Feeding: Set up;Supervision/ safety;Sitting   Grooming: Oral care;Min guard;Minimal assistance;Standing Grooming Details (indicate cue type and reason): Min Guard A for safety in standing. Min A for 2-3 episodes of LOB with posterior lean. Pt also requiring Mod cues for sequencing as pt not placing tooth paste on his tooth brush, forgetting to turn off water, and picking tooth paste cap out of sink after dropping it in there. Pt with right inattention and not locating items on R side Upper Body Bathing: Set up;Sitting;Supervision/ safety   Lower Body Bathing: Minimal assistance;Sit to/from stand   Upper Body Dressing : Set up;Supervision/safety;Sitting   Lower Body Dressing: Minimal assistance;Sit to/from stand Lower Body Dressing Details (indicate cue type and reason): Pt donning socks while in bed. Pt first placing his socks on his hands Toilet Transfer: Min guard;Minimal assistance;Ambulation;RW(simulated to recliner)   Toileting- Clothing Manipulation and Hygiene: Minimal assistance;Sit to/from stand Toileting - Clothing Manipulation Details (indicate cue type and reason): Min A for managing gown and then Min Guard-Min A for maintaining balance     Functional mobility during ADLs: Minimal  assistance;Rolling walker;Min guard General ADL Comments: Pt presenting with decreased strength, balance, cogntiion, and vision.      Vision Baseline Vision/History: No visual deficits Patient Visual Report: No change from baseline Vision  Assessment?: Yes Eye Alignment: Within Functional Limits Ocular Range of Motion: Within Functional Limits Tracking/Visual Pursuits: Requires cues, head turns, or add eye shifts to track;Unable to hold eye position out of midline;Decreased smoothness of horizontal tracking;Decreased smoothness of vertical tracking Visual Fields: (Inattention to right visual field as seen during ADLs) Additional Comments: Poor visual attention and difficulty completeing tracking assessment.      Perception     Praxis      Pertinent Vitals/Pain Pain Assessment: No/denies pain     Hand Dominance Right   Extremity/Trunk Assessment Upper Extremity Assessment Upper Extremity Assessment: Overall WFL for tasks assessed   Lower Extremity Assessment Lower Extremity Assessment: Defer to PT evaluation   Cervical / Trunk Assessment Cervical / Trunk Assessment: Kyphotic   Communication Communication Communication: Expressive difficulties   Cognition Arousal/Alertness: Awake/alert Behavior During Therapy: Flat affect Overall Cognitive Status: Impaired/Different from baseline Area of Impairment: Orientation;Safety/judgement;Following commands;Attention;Problem solving;Awareness                 Orientation Level: Disoriented to;Place;Time;Situation Current Attention Level: Sustained   Following Commands: Follows multi-step commands with increased time Safety/Judgement: Decreased awareness of safety;Decreased awareness of deficits Awareness: Emergent Problem Solving: Slow processing;Requires verbal cues;Requires tactile cues;Difficulty sequencing General Comments: generally groggy after sedation recently lifted. Pt with poor attention, following of commands, and problem solving.    General Comments  Wife present throughout. VSS    Exercises     Shoulder Instructions      Home Living Family/patient expects to be discharged to:: Private residence Living Arrangements: Spouse/significant  other Available Help at Discharge: Family Type of Home: House Home Access: Stairs to enter [employer] of Steps: 2 Entrance Stairs-Rails: None Home Layout: Two level;Able to live on main level with bedroom/bathroom     Bathroom Shower/Tub: Occupational psychologist: Handicapped height     Home Equipment: None   Additional Comments: works as a Designer, multimedia Level of Independence: Independent                 OT Problem List: Decreased strength;Decreased range of motion;Decreased activity tolerance;Impaired balance (sitting and/or standing);Impaired vision/perception;Decreased cognition;Decreased safety awareness;Decreased knowledge of use of DME or AE;Decreased knowledge of precautions      OT Treatment/Interventions: Self-care/ADL training;Therapeutic exercise;Energy conservation;DME and/or AE instruction;Therapeutic activities;Cognitive remediation/compensation;Patient/family education    OT Goals(Current goals can be found in the care plan section) Acute Rehab OT Goals Patient Stated Goal: to go home OT Goal Formulation: With patient/family Time For Goal Achievement: 07/29/19 Potential to Achieve Goals: Good  OT Frequency: Min 3X/week   Barriers to D/C:            Co-evaluation PT/OT/SLP Co-Evaluation/Treatment: Yes Reason for Co-Treatment: For patient/therapist safety;To address functional/ADL transfers   OT goals addressed during session: ADL's and self-care      AM-PAC OT "6 Clicks" Daily Activity     Outcome Measure Help from another person eating meals?: None Help from another person taking care of personal grooming?: A Little Help from another person toileting, which includes using toliet, bedpan, or urinal?: A Little Help from another person bathing (including washing, rinsing, drying)?: A Little Help from another person to put on and taking off regular upper body clothing?: None Help from another  person to put on and taking off regular lower body clothing?: A Little 6 Click Score: 20   End of Session Equipment Utilized During Treatment: Gait belt Nurse Communication: Mobility status  Activity Tolerance: Patient tolerated treatment well Patient left: in chair;with call bell/phone within reach;with chair alarm set;with family/visitor present  OT Visit Diagnosis: Unsteadiness on feet (R26.81);Other abnormalities of gait and mobility (R26.89);Muscle weakness (generalized) (M62.81);Other symptoms and signs involving cognitive function                Time: 1243-1317 OT Time Calculation (min): 34 min Charges:  OT General Charges $OT Visit: 1 Visit OT Evaluation $OT Eval Moderate Complexity: [place], OTR/L Acute Rehab Pager: [phone] Office: [place] 07/15/2019, 5:42 PM

## 2019-07-15 NOTE — Progress Notes (Signed)
[facility] Progress Note Patient Name: Thomas Norman DOB: [birth year] MRN: [id number]   Date of Service  07/15/2019  HPI/Events of Note  Hypoglycemia - Episodes to 53 and 54. Already treated with D50.  eICU Interventions  Will order: 1. D10W to run IV at 30 mL/hour.      Intervention Category Major Interventions: Other:  [name] 07/15/2019, 3:44 AM

## 2019-07-15 NOTE — Procedures (Signed)
Extubation Procedure Note  Patient Details:   Name: Thomas Norman DOB: 16-Jul-1951 MRN: [id number]   Airway Documentation:    Vent end date: 07/15/19 Vent end time: 1010   Evaluation  O2 sats: stable throughout Complications: No apparent complications Patient did tolerate procedure well. Bilateral Breath Sounds: Clear, Diminished   Yes   Pt extubated to 4L N/C.  No stridor noted.  RN @ bedside.  Donnetta Hail 07/15/2019, 10:12 AM

## 2019-07-15 NOTE — Progress Notes (Signed)
STROKE TEAM PROGRESS NOTE   INTERVAL HISTORY His RN is at the bedside.  Pt still intubated but sitting up in bed, moving all extremities and following simple commands. Attending to both sides. Plan to extubate. MRI showed no mass lesion. CT stable hematoma. CTA no AVM. BP controlled.   OBJECTIVE Vitals:   07/15/19 0500 07/15/19 0521 07/15/19 0600 07/15/19 0700  BP: (!) 84/62  (!) 86/62 91/71  Pulse: (!) 59  65 60  Resp: 16  16 12   Temp:      TempSrc:      SpO2: 100% 100% 100% 100%  Weight: 75.4 kg     Height:        CBC:  Recent Labs  Lab 07/14/19 1104  07/15/19 0312 07/15/19 0628  WBC 9.Norman  --   --  9.8  NEUTROABS 4.6  --   --   --   HGB 16.0   < > 11.9* 13.0  HCT 49.8   < > 35.0* 39.7  MCV 96.7  --   --  91.9  PLT 252  --   --  170   < > = values in this interval not displayed.    Basic Metabolic Panel:  Recent Labs  Lab 07/14/19 1104 07/14/19 1115 07/14/19 1155  07/14/19 1618 07/14/19 1628 07/15/19 0312  NA 147* 147*  --    < > 137 139 140  K 3.7 3.6  --    < > 3.5 3.3* 3.7  CL 111 114*  --   --  110  --   --   CO2 14*  --   --   --  19*  --   --   GLUCOSE 114* 105*  --   --  89  --   --   BUN 16 19  --   --  14  --   --   CREATININE 1.22 1.00  --   --  1.04  --   --   CALCIUM 9.9  --   --   --  8.4*  --   --   MG  --   --  1.8  --   --   --   --   PHOS  --   --  1.7*  --   --   --   --    < > = values in this interval not displayed.    Lipid Panel:     Component Value Date/Time   CHOL 165 06/11/2007 1228   TRIG 141 07/14/2019 1155   HDL 34.9 (L) 06/11/2007 1228   CHOLHDL 4.7 CALC 06/11/2007 1228   VLDL 35 06/11/2007 1228   LDLCALC 95 06/11/2007 1228   HgbA1c:  Lab Results  Component Value Date   HGBA1C 5.Norman 07/14/2019   Urine Drug Screen:     Component Value Date/Time   LABOPIA NONE DETECTED 07/14/2019 1155   COCAINSCRNUR NONE DETECTED 07/14/2019 1155   LABBENZ NONE DETECTED 07/14/2019 1155   AMPHETMU NONE DETECTED 07/14/2019 1155   THCU  NONE DETECTED 07/14/2019 1155   LABBARB NONE DETECTED 07/14/2019 1155    Alcohol Level     Component Value Date/Time   ETH <10 07/14/2019 1217    IMAGING  Ct Angio Head W Or Wo Contrast Ct Angio Neck W Or Wo Contrast 07/14/2019 IMPRESSION:  No large vessel occlusion, stenosis, or significant atherosclerosis in the head and neck.   Ct Head Code Stroke Wo Contrast 07/14/2019 IMPRESSION: Norman.6 cm acute  left parietal parenchymal hemorrhage. Mild edema without mass effect.  Ct Head Wo Contrast 07/15/2019 IMPRESSION:  1. Unchanged size of posterior left parietal hemorrhage with surrounding edema.  Norman. Possible tiny hemorrhage in the dependent left lateral ventricle. No other change from prior exam.   Mr Thomas Norman Wo Contrast 07/15/2019 IMPRESSION:  1. Stable size and morphology of intra-axial hemorrhage centered at the left parietal convexity. Mild localized edema without significant regional mass effect. No underlying mass lesion, abnormal enhancement, or other structural abnormality identified.  Norman. No other acute intracranial process.  3. Underlying mild to moderate chronic microvascular ischemic disease.   ECG - SB rate 59 BPM. (See cardiology reading for complete details)  EEG 07/14/19 IMPRESSION: This study showed evidence of epileptogenicity in left frontal area. There was also severe diffuse encephalopathy, which could be secondary to sedated state. No seizures were seen throughout the recording. The excessive beta activity seen in the background is most likely due to the effect of medications like benzodiazepine and is a benign EEG pattern.   PHYSICAL EXAM  Temp:  [97.8 F (36.6 C)-99 F (37.Norman C)] 99 F (37.Norman C) (09/21 0800) Pulse Rate:  [55-106] 97 (09/21 0800) Resp:  [12-28] 13 (09/21 0800) BP: (82-206)/(57-137) 130/75 (09/21 0800) SpO2:  [97 %-100 %] 100 % (09/21 0857) FiO2 (%):  [30 %-100 %] 40 % (09/21 0900) Weight:  [74.6 kg-75.4 kg] 75.4 kg (09/21  0500)  General - Well nourished, well developed, intubated on sedation.  Ophthalmologic - fundi not visualized due to noncooperation.  Cardiovascular - Regular rhythm but mild tachycardia.  Neuro - intubated on sedation, eyes open, awake alert, following commands. No gaze reference, attending to both sides, blinking to visual threat, PERRL. Corneal reflex present, gag and cough present. Breathing over the vent, on weaning trial tolerating well.  Facial symmetry not able to test due to ET tube.  Tongue protrusion not cooperative. Moving all extremities equally and symmetrically. DTR 1+ and no babinski. Sensation, coordination not cooperative and gait not tested.   ASSESSMENT/PLAN Thomas Norman is a 68 y.o. male with history of HTN and HLD presenting with aphasia, confusion, agitation and elevated BP.  He did not receive IV t-PA due to [place].  ICH - small acute left parietal ICH, likely related to hypertension.  Code Stroke CT Head - Norman.6 cm acute left parietal parenchymal hemorrhage. Mild edema without mass effect.  CT head - Unchanged size of posterior left parietal hemorrhage with surrounding edema. Possible tiny hemorrhage in the dependent left lateral ventricle.   MRI head - Stable size and morphology of intra-axial hemorrhage centered at the left parietal convexity. No underlying mass lesion  CTA H&N - unremarkable  Thomas Norman  - negative  LDL - 95  HgbA1c - 5.Norman  UDS - negative  VTE prophylaxis - SCDs  No antithrombotic prior to admission, now on No antithrombotic.   Ongoing aggressive stroke risk factor management  Therapy recommendations:  pending  Disposition:  Pending  Seizure  Tonic seizure in ER  EEG - evidence of epileptogenicity in left frontal area. There was also severe diffuse encephalopathy, which could be secondary to sedated state.  On keppra following with keppra load  Continue keppra   Seizure precautions  Hypoglycemia  CBG as  low as 54  On D10 infusion  Glucose stable now  Consider swallow screen after extubation  Resume diet if able   Hx of hypertension hypotension  Home BP meds: Micardis 40 mg  daily  Current BP meds: Cleviprex prn  Pt hypotensive on levophed  SBP goal < 160  . Long-term BP goal normotensive  Hyperlipidemia  Home Lipid lowering medication:  Fish oil  LDL 95, goal < 70  Continue fish oil on discharge  Other Stroke Risk Factors  Advanced age  ETOH use, advised to drink no more than 1 alcoholic beverage per day.  Other Active Problems  Hypokalemia - corrected  Hypophosphatemia - 1.7  Hospital day # 1  This patient is critically ill due to [place], seizure, hypotension and hypoglycemia and at significant risk of neurological worsening, death form hematoma expansion, status epilepticus, shock, hypoglycemia. This patient's care requires constant monitoring of vital signs, hemodynamics, respiratory and cardiac monitoring, review of multiple databases, neurological assessment, discussion with family, other specialists and medical decision making of high complexity. I spent 35 minutes of neurocritical care time in the care of this patient.  Rosalin Hawking, MD PhD Stroke Neurology 07/15/2019 9:42 AM    To contact Stroke Continuity provider, please refer to [email or web address] After hours, contact General Neurology

## 2019-07-15 NOTE — Progress Notes (Signed)
[facility] Progress Note Patient Name: Thomas Norman DOB: 1951/07/21 MRN: [id number]   Date of Service  07/15/2019  HPI/Events of Note  Called to transfer patient to create an ICU bed for a more critically ill. Patient extubated earlier today and has been stable on room air.   eICU Interventions  Will transfer patient to Stepdown/Progressive bed.      Intervention Category Major Interventions: Other:  [name] 07/15/2019, 10:22 PM

## 2019-07-15 NOTE — Progress Notes (Signed)
[facility] Progress Note Patient Name: Thomas Norman DOB: Aug 27, 1951 MRN: [id number]   Date of Service  07/15/2019  HPI/Events of Note  ABG on 40%/PRVC 16/TV 620/P 5 = 7.503/24.9/161.0.  eICU Interventions  Will order: 1. Decrease PRVC rate to 12.  2. Repeat ABG at 7:30 AM.     Intervention Category Major Interventions: Acid-Base disturbance - evaluation and management;Respiratory failure - evaluation and management  [place],Steven Eugene 07/15/2019, 5:14 AM

## 2019-07-15 NOTE — Progress Notes (Signed)
205 mL fentanyl wasted in sink with Research scientist (medical)

## 2019-07-15 NOTE — Progress Notes (Signed)
Notified Dr. Leonel Ramsay that we are transferring patient to 3W for progressive bed. Verbal order to change neuro checks to every 2 hours for 12 hours and then every 4 hours after that. Every 2 hours will start 9/22 at midnight and will end at noon.

## 2019-07-15 NOTE — Progress Notes (Signed)
PT Cancellation Note  Patient Details Name: Thomas Norman MRN: [id number] DOB: Mar 26, 1951   Cancelled Treatment:    Reason Eval/Treat Not Completed: Patient not medically ready; patient intubated and sedated.  Will check back another day.   Reginia Naas 07/15/2019, 9:01 AM Magda Kiel, [place] [phone] 07/15/2019

## 2019-07-15 NOTE — Progress Notes (Signed)
SLP Cancellation Note  Patient Details Name: RIKKI HAMDAN MRN: [id number] DOB: [birth year]   Cancelled treatment:       Reason Eval/Treat Not Completed: Medical issues which prohibited therapy, is currently intubated. Will continue to follow.   Venita Sheffield Nix 07/15/2019, 6:56 AM   Pollyann Glen, M.A. [place] Acute Environmental education officer [phone] Office [phone]

## 2019-07-15 NOTE — Progress Notes (Signed)
Notified Elink about 3 consecutive CBGs < 70. Orders received for D10 infusion

## 2019-07-15 NOTE — Progress Notes (Signed)
NAME:  Thomas Norman, MRN:  [id number], DOB:  08/20/1951, LOS: 1 ADMISSION DATE:  07/14/2019, CONSULTATION DATE:  07/14/2019 REFERRING MD:  ED, CHIEF COMPLAINT:  Hypoxemia / Airway Protection S/P Seizure  Brief History   Pt. With PMH  of HTN and hyperlipidemia presented to the ED  On 07/14/2019 from home for acute onset  slurred speech and confusion. He had a seizure at the check in desk and was rushed to Trauma B where he became combative. He  was intubated for airway protection. BP was found to be [id number] + systolic. Code Stroke was called. CT head was positive for a 2.6 cm acute left parietal parenchymal hemorrhage. Mild edema without mass effect  PCCM have been consulted  to admit pt for critical care management.     Past Medical History   Past Medical History:  Diagnosis Date  . Hypertension   . Kidney calculi      Significant Hospital Events   9/20>> Admission 9/20>> ETT  Consults:  9/20>> Neuro  Procedures:    Significant Diagnostic Tests:  07/14/2019 CT Head>>  acute parenchymal hemorrhage in the posterior left parietal lobe measures 2.4 x 2.6 x 1.5 cm (approximately 5 cc) with mild surrounding edema. There is no midline shift or other significant mass effect. No acute infarct is identified separate from the hemorrhage. There is no extra-axial fluid collection. The ventricles are normal in size.  9/20 CTA Head and Neck >> No large vessel occlusion, stenosis, or significant atherosclerosis in the head and neck.  07/14/2019 MR Brain Stable size and morphology of intra-axial hemorrhage centered at the left parietal convexity. Mild localized edema    9/20 EEG ABNORMALITY: 1. Spikes, left frontal, maximal FP1/F7 2. Continuous slow, generalized 3. Excessive beta, generalized   IMPRESSION: evidence of epileptogenicity in left frontal area. No seizures    Micro Data:  07/14/2019 SARS Coronavirus>> neg  Antimicrobials:   None  Interim history/subjective:     Afebrile last 24 hours. No events overnight Awake and agitated this morning when sedation turned off Hypoglycemic this morning given D50  Objective   Blood pressure 119/70, pulse 90, temperature 99 F (37.2 C), temperature source Axillary, resp. rate 14, height 6' (1.829 m), weight 75.4 kg, SpO2 100 %.    Vent Mode: CPAP;PSV FiO2 (%):  [30 %-100 %] 40 % Set Rate:  [12 bmp-18 bmp] 12 bmp Vt Set:  [620 mL] 620 mL PEEP:  [5 cmH20] 5 cmH20 Pressure Support:  [5 cmH20] 5 cmH20 Plateau Pressure:  [13 cmH20-16 cmH20] 13 cmH20   Intake/Output Summary (Last 24 hours) at 07/15/2019 1001 Last data filed at 07/15/2019 0900 Gross per 24 hour  Intake 2082.45 ml  Output 800 ml  Net 1282.45 ml   Filed Weights   07/14/19 1103 07/14/19 1500 07/15/19 0500  Weight: 74.6 kg 75.2 kg 75.4 kg    Examination: General: Well-built, well-nourished man sitting up in bed with mild agitation HENT:NCAT, No LAD, ETT, OGT, No JVD  Lungs: Clear bilateral Cardiovascular: S1, S2, RRR, No RMG Abdomen: Soft, NT, ND, BS diminished Extremities: No obvious deformities, brisk refill Neuro: Follows commands, strong grip both hands, moves all 4 extremities GU: Foley Cath  Resolved Hospital Problem list     Assessment & Plan:  Acute Small left parietal  Hemorrhage  2.4 x 2.6 x 1.5 cm (approximately 5 cc) with mild surrounding edema. 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 Plan Per  Neuro Stroke Team Neuro assessments per unit protocol Continue Keppra    Acute Respiratory Failure , inability to protect Airway  Plan Tolerated spontaneous breathing trials, proceed with extubation DC sedatives  Hypertension  Plan Hold home Micardis Use labetalol and hydralazine PRN with parameters  Hypoglycemia-continue D10 drip until he is able to take orals for swallow evaluation   Best practice:  Diet: NPO Pain/Anxiety/Delirium protocol (if indicated):  Fentanyl and Propofol VAP protocol (if indicated): initiated DVT prophylaxis: SCD>> NO heparin/ blood thinners GI prophylaxis: Protonix per tube Glucose control: CBG with SSI Mobility: BR Code Status: Full Family Communication: Wife updated 9/20 in the Ed Disposition: ICU  The patient is critically ill with multiple organ systems failure and requires high complexity decision making for assessment and support, frequent evaluation and titration of therapies, application of advanced monitoring technologies and extensive interpretation of multiple databases. Critical Care Time devoted to patient care services described in this note independent of APP/resident  time is 32 minutes.   Kara Mead MD. Shade Flood. [facility] Pager [phone] If no response call [phone]      07/15/2019 10:01 AM

## 2019-07-15 NOTE — Progress Notes (Signed)
Patient transported from [id number] to MRI, then to CT, then back to [id number] with no complications.

## 2019-07-15 NOTE — Evaluation (Signed)
Physical Therapy Evaluation Patient Details Name: Thomas Norman MRN: [id number] DOB: 1951/09/04 Today's Date: 07/15/2019   History of Present Illness  Thomas Norman is a 68 y.o. Caucasian male with PMH of HTN, HLD presented as aphasia.  He had seizure like activity in ED and was intubated,  CT/MRI showed small L parietal hemorrhage.  Patient extubated morning of 07/15/19.  Clinical Impression  Patient presents with decreased independence with mobility due to limited safety and deficit awareness, decreased R sided attention and some general coordination and balance deficits.  He has recently been extubated and come off of sedation so some deficits may resolve.  Feel he may need HHPT versus outpatient depending of resolution of some of his symptoms.  Wife states she will be available to assist at d/c.  PT to follow acutely.     Follow Up Recommendations Home health PT;Supervision/Assistance - 24 hour    Equipment Recommendations  Other (comment)(depending on progress, may need walker)    Recommendations for Other Services       Precautions / Restrictions Precautions Precautions: Fall Precaution Comments: mild R side inattention      Mobility  Bed Mobility Overal bed mobility: Needs Assistance Bed Mobility: Supine to Sit     Supine to sit: Supervision;HOB elevated     General bed mobility comments: assist for lines and safety  Transfers Overall transfer level: Needs assistance Equipment used: Rolling walker (2 wheeled) Transfers: Sit to/from Stand Sit to Stand: Min guard         General transfer comment: assist for balance, pt initially bracing with legs against edge of bed, then LOB and sat back on bed, gave RW for UE support with improved balance second attempt  Ambulation/Gait Ambulation/Gait assistance: Min guard;+2 safety/equipment Gait Distance (Feet): 150 Feet Assistive device: Rolling walker (2 wheeled) Gait Pattern/deviations: Step-through pattern;Wide base  of support;Trunk flexed;Decreased stride length     General Gait Details: assist for balance/safety esp on turns with leaning to one side, second person to manage IV/telemetry, ran into door facing on R side with walker and into computer on R side in room, but managed to maneuver around bed on r side in hallway  Stairs            Wheelchair Mobility    Modified Rankin (Stroke Patients Only) Modified Rankin (Stroke Patients Only) Pre-Morbid Rankin Score: No symptoms Modified Rankin: Moderately severe disability     Balance Overall balance assessment: Needs assistance   Sitting balance-Leahy Scale: Good     Standing balance support: During functional activity;Bilateral upper extremity supported Standing balance-Leahy Scale: Poor Standing balance comment: LOB initially and sat back on bed without UE support, using some external support with teeth brushing in bathroom                             Pertinent Vitals/Pain Pain Assessment: No/denies pain    Home Living Family/patient expects to be discharged to:: Private residence Living Arrangements: Spouse/significant other Available Help at Discharge: Family Type of Home: House Home Access: Stairs to enter Entrance Stairs-Rails: None Entrance Stairs-Number of Steps: 2 Home Layout: Two level;Able to live on main level with bedroom/bathroom Home Equipment: None 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 Level of Independence: Independent               Journalist, newspaper        Extremity/Trunk Assessment   Upper Extremity  Assessment Upper Extremity Assessment: Overall WFL for tasks assessed    Lower Extremity Assessment Lower Extremity Assessment: Overall WFL for tasks assessed(generally slowed with coordination tasks)    Cervical / Trunk Assessment Cervical / Trunk Assessment: Kyphotic  Communication   Communication: Expressive difficulties  Cognition Arousal/Alertness:  Awake/alert Behavior During Therapy: Flat affect Overall Cognitive Status: Impaired/Different from baseline Area of Impairment: Orientation;Safety/judgement;Following commands;Attention                 Orientation Level: Disoriented to;Place;Time;Situation Current Attention Level: Sustained   Following Commands: Follows multi-step commands with increased time Safety/Judgement: Decreased awareness of safety;Decreased awareness of deficits     General Comments: generally groggy after sedation recently lifted      General Comments General comments (skin integrity, edema, etc.): noticing pt running into obstacles on R side    Exercises     Assessment/Plan    PT Assessment Patient needs continued PT services  PT Problem List Decreased balance;Decreased knowledge of use of DME;Decreased knowledge of precautions;Decreased safety awareness;Decreased mobility;Decreased activity tolerance       PT Treatment Interventions DME instruction;Stair training;Gait training;Functional mobility training;Therapeutic exercise;Patient/family education;Balance training;Cognitive remediation;Therapeutic activities    PT Goals (Current goals can be found in the Care Plan section)  Acute Rehab PT Goals Patient Stated Goal: to go home PT Goal Formulation: With patient/family Time For Goal Achievement: 07/29/19 Potential to Achieve Goals: Good    Frequency Min 4X/week   Barriers to discharge        Co-evaluation PT/OT/SLP Co-Evaluation/Treatment: Yes Reason for Co-Treatment: To address functional/ADL transfers;Necessary to address cognition/behavior during functional activity PT goals addressed during session: Mobility/safety with mobility;Balance;Proper use of DME         AM-PAC PT "6 Clicks" Mobility  Outcome Measure Help needed turning from your back to your side while in a flat bed without using bedrails?: None Help needed moving from lying on your back to sitting on the side of a  flat bed without using bedrails?: None Help needed moving to and from a bed to a chair (including a wheelchair)?: A Little Help needed standing up from a chair using your arms (e.g., wheelchair or bedside chair)?: A Little Help needed to walk in hospital room?: A Little Help needed climbing 3-5 steps with a railing? : A Little 6 Click Score: 20    End of Session Equipment Utilized During Treatment: Gait belt Activity Tolerance: Patient tolerated treatment well Patient left: in chair;with call bell/phone within reach;with family/visitor present;with chair alarm set   PT Visit Diagnosis: Other abnormalities of gait and mobility (R26.89);Other symptoms and signs involving the nervous system (R29.898)    Time: 1245-1315 PT Time Calculation (min) (ACUTE ONLY): 30 min   Charges:   PT Evaluation $PT Eval Moderate Complexity: [place], [place] Acute Rehabilitation Services [phone] 07/15/2019   Reginia Naas 07/15/2019, 1:55 PM

## 2019-07-15 NOTE — Progress Notes (Signed)
0734 CBG 58  0746 1 ampule D50 given  0805 CBG 119

## 2019-07-16 DIAGNOSIS — I10 Essential (primary) hypertension: Secondary | ICD-10-CM

## 2019-07-16 DIAGNOSIS — G9340 Encephalopathy, unspecified: Secondary | ICD-10-CM

## 2019-07-16 LAB — GLUCOSE, CAPILLARY
Glucose-Capillary: 100 mg/dL — ABNORMAL HIGH (ref 70–99)
Glucose-Capillary: 112 mg/dL — ABNORMAL HIGH (ref 70–99)
Glucose-Capillary: 115 mg/dL — ABNORMAL HIGH (ref 70–99)
Glucose-Capillary: 122 mg/dL — ABNORMAL HIGH (ref 70–99)
Glucose-Capillary: 126 mg/dL — ABNORMAL HIGH (ref 70–99)

## 2019-07-16 LAB — BLOOD GAS, ARTERIAL
Acid-Base Excess: 0 mmol/L (ref 0.0–2.0)
Bicarbonate: 23.6 mmol/L (ref 20.0–28.0)
Drawn by: 560031
FIO2: 21
O2 Saturation: 92.3 %
Patient temperature: 98.6
pCO2 arterial: 35.1 mmHg (ref 32.0–48.0)
pH, Arterial: 7.443 (ref 7.350–7.450)
pO2, Arterial: 60.7 mmHg — ABNORMAL LOW (ref 83.0–108.0)

## 2019-07-16 LAB — CBC
HCT: 34.8 % — ABNORMAL LOW (ref 39.0–52.0)
Hemoglobin: 12.1 g/dL — ABNORMAL LOW (ref 13.0–17.0)
MCH: 31.2 pg (ref 26.0–34.0)
MCHC: 34.8 g/dL (ref 30.0–36.0)
MCV: 89.7 fL (ref 80.0–100.0)
Platelets: 157 10*3/uL (ref 150–400)
RBC: 3.88 MIL/uL — ABNORMAL LOW (ref 4.22–5.81)
RDW: 12.1 % (ref 11.5–15.5)
WBC: 11.7 10*3/uL — ABNORMAL HIGH (ref 4.0–10.5)
nRBC: 0 % (ref 0.0–0.2)

## 2019-07-16 LAB — BASIC METABOLIC PANEL
Anion gap: 9 (ref 5–15)
BUN: 7 mg/dL — ABNORMAL LOW (ref 8–23)
CO2: 22 mmol/L (ref 22–32)
Calcium: 8.4 mg/dL — ABNORMAL LOW (ref 8.9–10.3)
Chloride: 106 mmol/L (ref 98–111)
Creatinine, Ser: 1.17 mg/dL (ref 0.61–1.24)
GFR calc Af Amer: >60 mL/min (ref >60)
GFR calc non Af Amer: >60 mL/min (ref >60)
Glucose, Bld: 119 mg/dL — ABNORMAL HIGH (ref 70–99)
Potassium: 4.1 mmol/L (ref 3.5–5.1)
Sodium: 137 mmol/L (ref 135–145)

## 2019-07-16 LAB — MAGNESIUM: Magnesium: 1.9 mg/dL (ref 1.7–2.4)

## 2019-07-16 MED ORDER — ACETAMINOPHEN 325 MG PO TABS
650.0000 mg | ORAL_TABLET | Freq: Four times a day (QID) | ORAL | Status: DC | PRN
  Administered 2019-07-16 – 2019-07-17 (×2): 650 mg via ORAL
  Filled 2019-07-16 (×2): qty 2

## 2019-07-16 MED ORDER — METOPROLOL TARTRATE 5 MG/5ML IV SOLN: 5.0000 mg | Freq: Four times a day (QID) | INTRAVENOUS | Status: DC | PRN

## 2019-07-16 MED ORDER — PANTOPRAZOLE SODIUM 40 MG PO TBEC
40.0000 mg | DELAYED_RELEASE_TABLET | Freq: Every day | ORAL | Status: DC
  Administered 2019-07-16 – 2019-07-18 (×3): 40 mg via ORAL
  Filled 2019-07-16 (×3): qty 1

## 2019-07-16 NOTE — Progress Notes (Signed)
Patient transferred to 3W36 via wheelchair with no problems. Patient did not want RN to wake pt's wife up now to notify her of the room change. Would like RN in am to call her in the morning to let her know.

## 2019-07-16 NOTE — TOC Initial Note (Signed)
Transition of Care [facility]) - Initial/Assessment Note    Patient Details  Name: Thomas Norman MRN: [id number] Date of Birth: [birth year]  Transition of Care [facility]) CM/SW Contact:    Pollie Friar, RN Phone Number: 07/16/2019, 3:28 PM  Clinical Narrative:                 PT/OT recommending [place] vs outpatient therapy. Pt and his wife prefer outpatient. Orders in Epic and information on the AVS. PCP: Dr Valli Glance with [facility] following.  Expected Discharge Plan: OP Rehab Barriers to Discharge: Continued Medical Work up   Patient Goals and CMS Choice   CMS Medicare.gov Compare Post Acute Care list provided to:: Patient Choice offered to / list presented to : Patient, Spouse  Expected Discharge Plan and Services Expected Discharge Plan: OP Rehab   Discharge Planning Services: CM Consult   Living arrangements for the past 2 months: Single Family Home                                      Prior Living Arrangements/Services Living arrangements for the past 2 months: Single Family Home Lives with:: Spouse Patient language and need for interpreter reviewed:: Yes(no needs) Do you feel safe going back to the place where you live?: Yes      Need for Family Participation in Patient Care: Yes (Comment)(24 hour supervision) Care giver support system in place?: Yes (comment)(wife states she can provide needed supervision)   Criminal Activity/Legal Involvement Pertinent to Current Situation/Hospitalization: No - Comment as needed  Activities of Daily Living Home Assistive Devices/Equipment: Eyeglasses, Blood pressure cuff ADL Screening (condition at time of admission) Patient's cognitive ability adequate to safely complete daily activities?: Yes Is the patient deaf or have difficulty hearing?: No Does the patient have difficulty seeing, even when wearing glasses/contacts?: No Does the patient have difficulty concentrating, remembering, or making decisions?: No Patient able  to express need for assistance with ADLs?: Yes Does the patient have difficulty dressing or bathing?: No Independently performs ADLs?: Yes (appropriate for developmental age) Does the patient have difficulty walking or climbing stairs?: No Weakness of Legs: None Weakness of Arms/Hands: None  Permission Sought/Granted                  Emotional Assessment Appearance:: Appears stated age Attitude/Demeanor/Rapport: Engaged Affect (typically observed): Accepting, Pleasant Orientation: : Oriented to Self, Oriented to Place, Oriented to  Time, Oriented to Situation   Psych Involvement: No (comment)  Admission diagnosis:  Hemorrhagic stroke ([place]) [I61.9] Acute encephalopathy [G93.40] Intraparenchymal hemorrhage of brain ([place]) [I61.9] Acute respiratory failure, unspecified whether with hypoxia or hypercapnia ([place]) [J96.00] Patient Active Problem List   Diagnosis Date Noted  . ICH (intracerebral hemorrhage) ([place]) 07/14/2019  . Hemorrhagic stroke ([place]) 07/14/2019  . Acute respiratory failure ([place])   . HYPERLIPIDEMIA 06/11/2007  . HYPERTENSION 06/11/2007  . ALLERGIC RHINITIS 06/11/2007   PCP:  No primary care provider on file. Pharmacy:   [facility], [place] - [id number], [place] [address] Phone: [phone] Fax: [phone]  [facility], [place] [address] [place] [address] Phone: [phone] Fax: [phone]     Social Determinants of Health (SDOH) Interventions    Readmission Risk Interventions No flowsheet data found.

## 2019-07-16 NOTE — Progress Notes (Signed)
Physical Therapy Treatment Patient Details Name: Thomas Norman MRN: [id number] DOB: 02-Nov-1950 Today's Date: 07/16/2019    History of Present Illness Thomas Norman is a 68 y.o. Caucasian male with PMH of HTN, HLD presented as aphasia.  He had seizure like activity in ED and was intubated,  CT/MRI showed small L parietal hemorrhage.  Patient extubated morning of 07/15/19.    PT Comments    Patient agrees to PT session. Reports no problems, wife present. Patient more alert, aware today. Improved mobility requiring no physical assistance with bed mobility or sit to stand transfers, however requires supervision due to impulsivity and decreased safety awareness. Patient ambulated 200 feet with RW, min guard. No difficulties reported although O2 sats dropped 2x during walk < 88%. Quickly returned to >90 with brief rest. Patient will benefit from continued skilled PT while here to improve mobility and safety.     Follow Up Recommendations  Outpatient PT;Supervision for mobility/OOB     Equipment Recommendations  Rolling walker with 5" wheels;Other (comment)(shower chair)    Recommendations for Other Services       Precautions / Restrictions Precautions Precautions: Fall Precaution Comments: mild R side inattention Restrictions Weight Bearing Restrictions: No    Mobility  Bed Mobility Overal bed mobility: Needs Assistance Bed Mobility: Supine to Sit     Supine to sit: Supervision;HOB elevated     General bed mobility comments: assist for lines and safety, trying to get up and requires cues to wait until lines are situated  Transfers Overall transfer level: Needs assistance Equipment used: Rolling walker (2 wheeled) Transfers: Sit to/from Stand Sit to Stand: Supervision         General transfer comment: steady with standing, supervision for safety  Ambulation/Gait Ambulation/Gait assistance: Min guard Gait Distance (Feet): 200 Feet Assistive device: Rolling walker (2  wheeled) Gait Pattern/deviations: Step-through pattern;Narrow base of support Gait velocity: WNL   General Gait Details: improved awareness this day, improved obstacle ovoidance   Stairs             Wheelchair Mobility    Modified Rankin (Stroke Patients Only) Modified Rankin (Stroke Patients Only) Pre-Morbid Rankin Score: No symptoms Modified Rankin: Slight disability     Balance Overall balance assessment: Needs assistance Sitting-balance support: Feet supported Sitting balance-Leahy Scale: Good     Standing balance support: Bilateral upper extremity supported;During functional activity Standing balance-Leahy Scale: Fair Standing balance comment: able to stand without UE support, during ambulation patient veers to right slightly, slight unsteadiness                            Cognition Arousal/Alertness: Awake/alert Behavior During Therapy: WFL for tasks assessed/performed;Impulsive Overall Cognitive Status: Impaired/Different from baseline Area of Impairment: Safety/judgement;Problem solving                   Current Attention Level: Focused   Following Commands: Follows one step commands consistently Safety/Judgement: Decreased awareness of safety;Decreased awareness of deficits Awareness: Emergent Problem Solving: Requires verbal cues General Comments: Patient demonstrates slight impulsivity      Exercises      General Comments General comments (skin integrity, edema, etc.): wife present throughout session.      Pertinent Vitals/Pain Pain Assessment: No/denies pain    Home Living                      Prior Function  PT Goals (current goals can now be found in the care plan section) Acute Rehab PT Goals Patient Stated Goal: to go home PT Goal Formulation: With patient/family Time For Goal Achievement: 07/29/19 Potential to Achieve Goals: Good Progress towards PT goals: Progressing toward goals     Frequency    Min 4X/week      PT Plan Discharge plan needs to be updated    Co-evaluation              AM-PAC PT "6 Clicks" Mobility   Outcome Measure  Help needed turning from your back to your side while in a flat bed without using bedrails?: None Help needed moving from lying on your back to sitting on the side of a flat bed without using bedrails?: None Help needed moving to and from a bed to a chair (including a wheelchair)?: A Little Help needed standing up from a chair using your arms (e.g., wheelchair or bedside chair)?: A Little Help needed to walk in hospital room?: A Little Help needed climbing 3-5 steps with a railing? : A Little 6 Click Score: 20    End of Session Equipment Utilized During Treatment: Gait belt Activity Tolerance: Patient tolerated treatment well Patient left: in chair;with call bell/phone within reach;with family/visitor present Nurse Communication: Mobility status PT Visit Diagnosis: Other abnormalities of gait and mobility (R26.89);Other symptoms and signs involving the nervous system (R29.898);Unsteadiness on feet (R26.81)     Time: [id number] PT Time Calculation (min) (ACUTE ONLY): 27 min  Charges:  $Gait Training: 23-37 mins                     Kristyn Conetta, PT, GCS 07/16/19,11:52 AM

## 2019-07-16 NOTE — Progress Notes (Signed)
   07/16/19 0500  Provider Notification  Provider Name/Title Dr Corinna Lines  Date Provider Notified 07/16/19  Time Provider Notified [phone]  Notification Type Page  Notification Reason Other (Comment) (Pt's temp of 100.4)  Response See new orders  Date of Provider Response 07/16/19  Time of Provider Response [phone]

## 2019-07-16 NOTE — Progress Notes (Signed)
  Speech Language Pathology Treatment: Cognitive-Linquistic  Patient Details Name: Thomas Norman MRN: [id number] DOB: 15-Jan-1951 Today's Date: 07/16/2019 Time: 1436-1500 SLP Time Calculation (min) (ACUTE ONLY): 24 min  Assessment / Plan / Recommendation Clinical Impression  Pt was seen for cognitive-linguistic treatment and was cooperative throughout the session. He reported that he would like to improve his cognition but stated that he would be content if it is not "perfect" after intervention since the cognition of his peers is not perfect. He consistently required mod-max cues for time management problems but achieved 80% accuracy when this level of support was provided. He completed a 3-task sequencing mental manipulation activity with 50% accuracy increasing to 83% accuracy with min-mod cues. He demonstrated 80% accuracy with 5-item immediate recall increasing to 100% with min cues. He was able to recall concrete information from voicemail recordings with 40% accuracy increasing to 80% with min-mod cues. SLP will continue to follow pt.    HPI HPI: Pt is a 68 y.o. male with PMH of HTN, HLD who presented with aphasia. TPA was not administered. MRI of the brain revealed intra-axial hemorrhage centered at the left parietal convexity. He had seizure like activity in ED and was intubated with extubation on 07/15/19      SLP Plan     Patient needs continued Speech Lanaguage Pathology Services    Recommendations                   Follow up Recommendations: Outpatient SLP SLP Visit Diagnosis: Cognitive communication deficit (R41.841)       Shanika I. Hardin Negus, [place], [place] Office number [phone] Pager [place] 07/16/2019, 3:37 PM

## 2019-07-16 NOTE — Progress Notes (Signed)
PROGRESS NOTE    HABEEB CHAGOLLA  [id number] DOB: 10-14-1951 DOA: 07/14/2019 PCP: No primary care provider on file.   Brief Narrative:  68 y.o. WM PMHx HTN, HLD    Presents the ER with altered mental status.  Last known normal was around 930 this morning.  Wife states patient was noted to be confused and brought to ER.  While in triage, reportedly He had a seizure at the check in desk and was rushed to Trauma B where he became combative. He  was intubated for airway protection. BP was found to be [id number] + systolic. Code Stroke was called. CT head was positive for a 2.6 cm acute left parietal parenchymal hemorrhage. Mild edema without mass effect  PCCM have been consulted  to admit pt for critical care management.      Subjective: 9/20 2A/O x4, negative CP, negative S OB, negative abdominal pain.  Negative HA.  States PT stop by and had some balance issues when ambulating.  Also SPO2 would drop when ambulating.   Assessment & Plan:   Active Problems:   ICH (intracerebral hemorrhage) (HCC)   Hemorrhagic stroke (HCC)   Acute respiratory failure (HCC)   ICH - small acute left parietal ICH, likely related to hypertension while he was working in his garage. No AVM, tumor or CAA.  Code Stroke CT Head - 2.6 cm acute left parietal parenchymal hemorrhage. Mild edema without mass effect.  CT head - Unchanged size of posterior left parietal hemorrhage with surrounding edema. Possible tiny hemorrhage in the dependent left lateral ventricle.   MRI head - Stable size and morphology of intra-axial hemorrhage centered at the left parietal convexity. No underlying mass lesion  CTA H&N - unremarkable  Lacey Jensen Virus 2  - negative  LDL - 95  HgbA1c - 5.2  UDS - negative  VTE prophylaxis - SCDs  No antithrombotic prior to admission, now on No antithrombotic.   Therapy recommendations:  OP PT, HH OT  Disposition:   Neurology has signed off  Ambulatory SPO2 pending.  Ensure  performed prior to patient discharging   Seizure  Tonic seizure in ER  EEG - evidence of epileptogenicity in left frontal area. There was also severe diffuse encephalopathy, which could be secondary to sedated state.  On keppra following with keppra load  Continue keppra   Seizure precautions  According to [place] law, pt can not drive until seizure free for 6 months and under physician's care.  Discussed with patient and wife, they expressed understanding  Hypoglycemia, resolved  CBG as low as 54  Treated with D10 infusion  Glucose stable now  Cleared for regular diet, thin liquis  Hx of hypertension hypotension  Home BP meds: Micardis 40 mg daily (hold) patient normotensive.  Treated with Cleviprex and levophed in ICU  SBP goal < 160   BP stable 120-130s  Long-term BP goal normotensive  Hyperlipidemia  Home Lipid lowering medication:  Fish oil  LDL 95  Continue fish oil on discharge  Other Stroke Risk Factors  Advanced age  ETOH use, advised to drink no more than 2 alcoholic beverage per day.  Other Active Problems  Hypokalemia - corrected Hypophosphatemia - 1.7   DVT prophylaxis: SCD Code Status: Full Family Communication: 9/22 wife at bedside Disposition Plan: Discharge 9/23   Consultants:  Stroke team   Procedures/Significant Events:  07/14/2019 CT Head>>  acute parenchymal hemorrhage in the posterior left parietal lobe measures 2.4 x 2.6 x 1.5 cm (approximately 5  cc) with mild surrounding edema. There is no midline shift or other significant mass effect. No acute infarct is identified separate from the hemorrhage. There is no extra-axial fluid collection. The ventricles are normal in size.  9/20 CTA Head and Neck >> No large vessel occlusion, stenosis, or significant atherosclerosis in the head and neck.  07/14/2019 MR Brain Stable size and morphology of intra-axial hemorrhage centered at the left parietal convexity. Mild  localized edema    9/20 EEG ABNORMALITY: 1. Spikes, left frontal, maximal FP1/F7 2. Continuous slow, generalized 3. Excessive beta, generalized  IMPRESSION: evidence of epileptogenicity in left frontal area.No seizures    I have personally reviewed and interpreted all radiology studies and my findings are as above.  VENTILATOR SETTINGS:    Cultures 07/14/2019 SARS Coronavirus>> neg  Antimicrobials:    Devices    LINES / TUBES:      Continuous Infusions:  sodium chloride 50 mL/hr at 07/16/19 0000   sodium chloride Stopped (07/14/19 2217)   dextrose 50 mL/hr at 07/16/19 0327   levETIRAcetam Stopped (07/15/19 2212)     Objective: Vitals:   07/16/19 0018 07/16/19 0200 07/16/19 0400 07/16/19 0600  BP: 131/81 114/73 120/69 122/73  Pulse: 79  93 84  Resp: 16 15 18 16   Temp: 100.2 F (37.9 C)  (!) 100.4 F (38 C) 98.7 F (37.1 C)  TempSrc: Oral  Oral Oral  SpO2: 95% 97% 93% 92%  Weight:    75.5 kg  Height:        Intake/Output Summary (Last 24 hours) at 07/16/2019 0804 Last data filed at 07/16/2019 0600 Gross per 24 hour  Intake 1640.15 ml  Output 400 ml  Net 1240.15 ml   Filed Weights   07/14/19 1500 07/15/19 0500 07/16/19 0600  Weight: 75.2 kg 75.4 kg 75.5 kg    Examination:  General - Well nourished, well developed, not in acute distress  Ophthalmologic - fundi not visualized due to noncooperation.  Cardiovascular - Regular rhythm but mild tachycardia.  Mental Status -  Level of arousal and orientation to time, place, and person were intact. Language including expression, naming, repetition, comprehension was assessed and found intact. Fund of Knowledge was assessed and was intact.  Cranial Nerves II - XII - II - Visual field intact; bilateral medial hemianopsia III, IV, VI - Extraocular movements intact. V - Facial sensation intact bilaterally. VII - Facial movement intact bilaterally. VIII - Hearing & vestibular intact  bilaterally. X - Palate elevates symmetrically. XI - Chin turning & shoulder shrug intact bilaterally. XII - Tongue protrusion intact.  Motor Strength - The patients strength was normal in all extremities and pronator drift was absent.  Bulk was normal and fasciculations were absent.   Motor Tone - Muscle tone was assessed at the neck and appendages and was normal.  Reflexes - The patients reflexes were symmetrical in all extremities and he had no pathological reflexes.  Sensory - Light touch, temperature/pinprick were assessed and were symmetrical.    Coordination - The patient had normal movements in the hands and feet with no ataxia or dysmetria.  Tremor was absent.  Gait and Station - deferred. .     Data Reviewed: Care during the described time interval was provided by me .  I have reviewed this patient's available data, including medical history, events of note, physical examination, and all test results as part of my evaluation.   CBC: Recent Labs  Lab 07/14/19 1104 07/14/19 1115 07/14/19 1316 07/14/19 1628 07/15/19  [id number] 07/15/19 0628  WBC 9.2  --   --   --   --  9.8  NEUTROABS 4.6  --   --   --   --   --   HGB 16.0 16.0 12.2* 11.9* 11.9* 13.0  HCT 49.8 47.0 36.0* 35.0* 35.0* 39.7  MCV 96.7  --   --   --   --  91.9  PLT 252  --   --   --   --  [id number]   Basic Metabolic Panel: Recent Labs  Lab 07/14/19 1104 07/14/19 1115 07/14/19 1155 07/14/19 1316 07/14/19 1618 07/14/19 1628 07/15/19 0312  NA 147* 147*  --  138 137 139 140  K 3.7 3.6  --  3.7 3.5 3.3* 3.7  CL 111 114*  --   --  110  --   --   CO2 14*  --   --   --  19*  --   --   GLUCOSE 114* 105*  --   --  89  --   --   BUN 16 19  --   --  14  --   --   CREATININE 1.22 1.00  --   --  1.04  --   --   CALCIUM 9.9  --   --   --  8.4*  --   --   MG  --   --  1.8  --   --   --   --   PHOS  --   --  1.7*  --   --   --   --    GFR: Estimated Creatinine Clearance: 74.6 mL/min (by C-G formula based on SCr  of 1.04 mg/dL). Liver Function Tests: Recent Labs  Lab 07/14/19 1104  AST 24  ALT 15  ALKPHOS 101  BILITOT 0.8  PROT 7.3  ALBUMIN 4.3   No results for input(s): LIPASE, AMYLASE in the last 168 hours. No results for input(s): AMMONIA in the last 168 hours. Coagulation Profile: Recent Labs  Lab 07/14/19 1104  INR 1.2   Cardiac Enzymes: No results for input(s): CKTOTAL, CKMB, CKMBINDEX, TROPONINI in the last 168 hours. BNP (last 3 results) No results for input(s): PROBNP in the last 8760 hours. HbA1C: Recent Labs    07/14/19 1618  HGBA1C 5.2   CBG: Recent Labs  Lab 07/15/19 1206 07/15/19 1513 07/15/19 2006 07/15/19 2335 07/16/19 0508  GLUCAP 123* 124* 109* 122* 100*   Lipid Profile: Recent Labs    07/14/19 1155  TRIG 141   Thyroid Function Tests: No results for input(s): TSH, T4TOTAL, FREET4, T3FREE, THYROIDAB in the last 72 hours. Anemia Panel: No results for input(s): VITAMINB12, FOLATE, FERRITIN, TIBC, IRON, RETICCTPCT in the last 72 hours. Urine analysis:    Component Value Date/Time   COLORURINE YELLOW 07/14/2019 1155   APPEARANCEUR CLOUDY (A) 07/14/2019 1155   LABSPEC 1.016 07/14/2019 1155   PHURINE 5.0 07/14/2019 1155   GLUCOSEU NEGATIVE 07/14/2019 1155   HGBUR LARGE (A) 07/14/2019 1155   BILIRUBINUR NEGATIVE 07/14/2019 [place] 07/14/2019 1155   PROTEINUR 30 (A) 07/14/2019 1155   NITRITE NEGATIVE 07/14/2019 1155   LEUKOCYTESUR NEGATIVE 07/14/2019 1155   Sepsis Labs: @LABRCNTIP (procalcitonin:4,lacticidven:4)  ) Recent Results (from the past 240 hour(s))  SARS Coronavirus 2 [facility] order, Performed in [facility] hospital lab) Nasopharyngeal Nasopharyngeal Swab     Status: None   Collection Time: 07/14/19 11:12 AM   Specimen: Nasopharyngeal Swab  Result Value  Ref Range Status   SARS Coronavirus 2 NEGATIVE NEGATIVE Final    Comment: (NOTE) If result is NEGATIVE SARS-CoV-2 target nucleic acids are NOT DETECTED. The  SARS-CoV-2 RNA is generally detectable in upper and lower  respiratory specimens during the acute phase of infection. The lowest  concentration of SARS-CoV-2 viral copies this assay can detect is 250  copies / mL. A negative result does not preclude SARS-CoV-2 infection  and should not be used as the sole basis for treatment or other  patient management decisions.  A negative result may occur with  improper specimen collection / handling, submission of specimen other  than nasopharyngeal swab, presence of viral mutation(s) within the  areas targeted by this assay, and inadequate number of viral copies  (<250 copies / mL). A negative result must be combined with clinical  observations, patient history, and epidemiological information. If result is POSITIVE SARS-CoV-2 target nucleic acids are DETECTED. The SARS-CoV-2 RNA is generally detectable in upper and lower  respiratory specimens dur ing the acute phase of infection.  Positive  results are indicative of active infection with SARS-CoV-2.  Clinical  correlation with patient history and other diagnostic information is  necessary to determine patient infection status.  Positive results do  not rule out bacterial infection or co-infection with other viruses. If result is PRESUMPTIVE POSTIVE SARS-CoV-2 nucleic acids MAY BE PRESENT.   A presumptive positive result was obtained on the submitted specimen  and confirmed on repeat testing.  While 2019 novel coronavirus  (SARS-CoV-2) nucleic acids may be present in the submitted sample  additional confirmatory testing may be necessary for epidemiological  and / or clinical management purposes  to differentiate between  SARS-CoV-2 and other Sarbecovirus currently known to infect humans.  If clinically indicated additional testing with an alternate test  methodology [phone]) is advised. The SARS-CoV-2 RNA is generally  detectable in upper and lower respiratory sp ecimens during the acute    phase of infection. The expected result is Negative. Fact Sheet for Patients:  StrictlyIdeas.no Fact Sheet for Healthcare Providers: BankingDealers.co.za This test is not yet approved or cleared by the [place] FDA and has been authorized for detection and/or diagnosis of SARS-CoV-2 by FDA under an Emergency Use Authorization (EUA).  This EUA will remain in effect (meaning this test can be used) for the duration of the COVID-19 declaration under Section 564(b)(1) of the Act, 21 U.S.C. section 360bbb-3(b)(1), unless the authorization is terminated or revoked sooner. Performed at [facility], [place] [address] [place], [place] [address]   MRSA PCR Screening     Status: None   Collection Time: 07/14/19  3:06 PM   Specimen: Nasal Mucosa; Nasopharyngeal  Result Value Ref Range Status   MRSA by PCR NEGATIVE NEGATIVE Final    Comment:        The GeneXpert MRSA Assay (FDA approved for NASAL specimens only), is one component of a comprehensive MRSA colonization surveillance program. It is not intended to diagnose MRSA infection nor to guide or monitor treatment for MRSA infections. Performed at [facility], [place] [address], [place], [place] [address]          Radiology Studies: Ct Angio Head W Or Wo Contrast  Result Date: 07/14/2019 CLINICAL DATA:  Follow-up intracranial hemorrhage. EXAM: CT ANGIOGRAPHY HEAD AND NECK TECHNIQUE: Multidetector CT imaging of the head and neck was performed using the standard protocol during bolus administration of intravenous contrast. Multiplanar CT image reconstructions and MIPs were obtained  to evaluate the vascular anatomy. Carotid stenosis measurements (when applicable) are obtained utilizing NASCET criteria, using the distal internal carotid diameter as the denominator. CONTRAST:  180mL OMNIPAQUE IOHEXOL 350 MG/ML SOLN COMPARISON:  None. FINDINGS: CTA NECK FINDINGS Aortic arch: Widely  patent arch vessel origins. Common origin of the left subclavian artery and left vertebral artery from the arch with an ectatic/fusiform dilated appearance of the common vessel measuring 2 cm in diameter and with a normal caliber of the vertebral and subclavian arteries more distally. No saccular aneurysm. Right carotid system: Patent and smooth without evidence of stenosis or dissection. Left carotid system: Patent and smooth without evidence of stenosis or dissection. Vertebral arteries: Patent and smooth without evidence of stenosis or dissection. Mildly dominant right vertebral artery. Skeleton: Moderate cervical disc and facet degeneration. Other neck: No evidence of cervical lymphadenopathy or mass. Endotracheal and enteric tubes in place with the former terminating well above the carina. Upper chest: Clear lung apices. Review of the MIP images confirms the above findings CTA HEAD FINDINGS Anterior circulation: The internal carotid arteries are widely patent from skull base to carotid termini. ACAs and MCAs are patent without evidence of proximal branch occlusion or significant proximal stenosis. No aneurysm or vascular malformation is identified. Posterior circulation: The intracranial vertebral arteries are widely patent to the basilar. A patent left PICA and bilateral SCAs are visualized. AICAs and a right PICA are not clearly identified. The basilar artery is widely patent. There is a small left posterior communicating artery. Both PCAs are patent without evidence of significant stenosis. No aneurysm or vascular malformation is identified. Venous sinuses: Not well evaluated due to arterial phase contrast timing. Anatomic variants: None. Review of the MIP images confirms the above findings IMPRESSION: No large vessel occlusion, stenosis, or significant atherosclerosis in the head and neck. Electronically Signed   By: Logan Bores M.D.   On: 07/14/2019 12:14   Ct Head Wo Contrast  Result Date:  07/15/2019 CLINICAL DATA:  Followup known intracranial hemorrhage. EXAM: CT HEAD WITHOUT CONTRAST TECHNIQUE: Contiguous axial images were obtained from the base of the skull through the vertex without intravenous contrast. COMPARISON:  Head CT earlier this day. FINDINGS: Brain: Posterior left parietal hemorrhage measures 2.7 x 2.4 x 1.5 cm (essentially unchanged from prior exam allowing for differences in caliper placement). Similar degree of surrounding edema. No associated midline shift or mass effect. Possible tiny hemorrhage in the dependent left lateral ventricle. No other new hemorrhage. No hydrocephalus. The basilar cisterns are patent. No subdural or extra-axial collection. Vascular: No hyperdense vessel or evidence of large vessel occlusion. Slight increased density of intracranial vasculature may be secondary to prior IV contrast administration. Skull: No fracture or focal lesion. Sinuses/Orbits: No acute findings. Bilateral cataract resection. Other: None. IMPRESSION: 1. Unchanged size of posterior left parietal hemorrhage with surrounding edema. 2. Possible tiny hemorrhage in the dependent left lateral ventricle. No other change from prior exam. Electronically Signed   By: Keith Rake M.D.   On: 07/15/2019 00:01   Ct Angio Neck W Or Wo Contrast  Result Date: 07/14/2019 CLINICAL DATA:  Follow-up intracranial hemorrhage. EXAM: CT ANGIOGRAPHY HEAD AND NECK TECHNIQUE: Multidetector CT imaging of the head and neck was performed using the standard protocol during bolus administration of intravenous contrast. Multiplanar CT image reconstructions and MIPs were obtained to evaluate the vascular anatomy. Carotid stenosis measurements (when applicable) are obtained utilizing NASCET criteria, using the distal internal carotid diameter as the denominator. CONTRAST:  153mL OMNIPAQUE IOHEXOL 350  MG/ML SOLN COMPARISON:  None. FINDINGS: CTA NECK FINDINGS Aortic arch: Widely patent arch vessel origins. Common  origin of the left subclavian artery and left vertebral artery from the arch with an ectatic/fusiform dilated appearance of the common vessel measuring 2 cm in diameter and with a normal caliber of the vertebral and subclavian arteries more distally. No saccular aneurysm. Right carotid system: Patent and smooth without evidence of stenosis or dissection. Left carotid system: Patent and smooth without evidence of stenosis or dissection. Vertebral arteries: Patent and smooth without evidence of stenosis or dissection. Mildly dominant right vertebral artery. Skeleton: Moderate cervical disc and facet degeneration. Other neck: No evidence of cervical lymphadenopathy or mass. Endotracheal and enteric tubes in place with the former terminating well above the carina. Upper chest: Clear lung apices. Review of the MIP images confirms the above findings CTA HEAD FINDINGS Anterior circulation: The internal carotid arteries are widely patent from skull base to carotid termini. ACAs and MCAs are patent without evidence of proximal branch occlusion or significant proximal stenosis. No aneurysm or vascular malformation is identified. Posterior circulation: The intracranial vertebral arteries are widely patent to the basilar. A patent left PICA and bilateral SCAs are visualized. AICAs and a right PICA are not clearly identified. The basilar artery is widely patent. There is a small left posterior communicating artery. Both PCAs are patent without evidence of significant stenosis. No aneurysm or vascular malformation is identified. Venous sinuses: Not well evaluated due to arterial phase contrast timing. Anatomic variants: None. Review of the MIP images confirms the above findings IMPRESSION: No large vessel occlusion, stenosis, or significant atherosclerosis in the head and neck. Electronically Signed   By: Logan Bores M.D.   On: 07/14/2019 12:14   Mr [name] [id number] Contrast  Result Date: 07/15/2019 CLINICAL DATA:  Follow-up  examination for intracranial hemorrhage. EXAM: MRI HEAD WITHOUT AND WITH CONTRAST TECHNIQUE: Multiplanar, multiecho pulse sequences of the brain and surrounding structures were obtained without and with intravenous contrast. CONTRAST:  32mL GADAVIST GADOBUTROL 1 MMOL/ML IV SOLN COMPARISON:  Prior CT from 07/14/2019. FINDINGS: Brain: Previously identified intra-axial hemorrhage centered at the left parietal convexity again seen, little interval changed in size and morphology measuring 2.2 x 1.9 x 1.8 cm. Surrounding low-density vasogenic edema with mild localized mass effect. Internal fluid-fluid level. No underlying mass lesion or abnormal enhancement identified. No other structural abnormality seen within this region by MRI. Mild scattered subarachnoid hemorrhage seen within the adjacent left parieto-occipital region. No intraventricular extension by MRI. Underlying cerebral volume within normal limits. Patchy T2/FLAIR hyperintensity within the periventricular and deep white matter both cerebral hemispheres noted, nonspecific, most likely related to chronic microvascular ischemic changes. Mild patchy involvement of the pons noted. Overall, appearance is mild to moderate in nature. No evidence for acute infarct elsewhere within the brain. No other encephalomalacia to suggest chronic cortical infarction. No other evidence for chronic intracranial hemorrhage. No significant dot burden to suggest cerebral amyloid angiopathy. No other mass lesion, mass effect, or midline shift. No hydrocephalus. No extra-axial fluid collection. Pituitary gland suprasellar region within normal limits. Midline structures intact and normal. No abnormal enhancement. Vascular: Major intracranial vascular flow voids are maintained. Skull and upper cervical spine: Craniocervical junction within normal limits. Upper cervical spine normal. Bone marrow signal intensity within normal limits. No scalp soft tissue abnormality. Sinuses/Orbits: Patient  status post bilateral ocular lens replacement. Paranasal sinuses are clear. No significant mastoid effusion. Inner ear structures grossly normal. Other: None. IMPRESSION: 1. Stable size and  morphology of intra-axial hemorrhage centered at the left parietal convexity. Mild localized edema without significant regional mass effect. No underlying mass lesion, abnormal enhancement, or other structural abnormality identified. 2. No other acute intracranial process. 3. Underlying mild to moderate chronic microvascular ischemic disease. Electronically Signed   By: Jeannine Boga M.D.   On: 07/15/2019 00:33   Dg Chest Port 1 View  Result Date: 07/15/2019 CLINICAL DATA:  Intubation. EXAM: PORTABLE CHEST 1 VIEW COMPARISON:  07/14/2019. FINDINGS: Endotracheal tube and NG tube in stable position. Heart size normal. Lung volumes bibasilar atelectasis. No focal infiltrate. No pleural effusion or pneumothorax. IMPRESSION: 1.  Lines and tubes stable position. 2.  Low lung volumes with mild bibasilar atelectasis. Electronically Signed   By: Marcello Moores  Register   On: 07/15/2019 07:29   Dg Chest Port 1 View  Result Date: 07/14/2019 CLINICAL DATA:  Advancement of ETT EXAM: PORTABLE CHEST 1 VIEW COMPARISON:  07/14/2019, 11:25 a.m. FINDINGS: No significant change in position of endotracheal tube, which is over the mid trachea and approximately 6 cm above the carina. Esophagogastric tube is positioned with tip and side port below the diaphragm. No acute abnormality of the lungs. The heart and mediastinum are unremarkable. IMPRESSION: 1. No significant change in position of endotracheal tube, which is over the mid trachea and approximately 6 cm above the carina. Esophagogastric tube is positioned with tip and side port below the diaphragm. 2.  No acute abnormality of the lungs. Electronically Signed   By: Eddie Candle M.D.   On: 07/14/2019 16:13   Dg Chest Portable 1 View  Result Date: 07/14/2019 CLINICAL DATA:  Pt here from  home with c/o aloc , pt had a seizure at the check in desk rushed back to trauma B ,Post intubation EXAM: PORTABLE CHEST 1 VIEW COMPARISON:  None. FINDINGS: Endotracheal tube tip projects 5.6 cm above the carina. Nasal/orogastric tube passes below the diaphragm within the stomach. Cardiac silhouette is normal in size. No mediastinal or hilar masses. Clear lungs.  No pleural effusion pneumothorax. Skeletal structures are grossly intact. IMPRESSION: 1. Endotracheal tube tip 5.6 cm above the carina. 2. Well-positioned nasogastric tube. 3. No acute cardiopulmonary disease. Electronically Signed   By: Lajean Manes M.D.   On: 07/14/2019 11:50   Dg Abd Portable 1v  Result Date: 07/14/2019 CLINICAL DATA:  Pre MRI. EXAM: PORTABLE ABDOMEN - 1 VIEW COMPARISON:  CT 07/24/2012 FINDINGS: Right aspect of the abdomen not entirely included in the field of view. Enteric tube in place with tip and side-port below the diaphragm in the stomach. No visualized radiopaque foreign bodies in the abdomen. Excreted IV contrast in the urinary bladder from prior head and neck CTA. IMPRESSION: No radiopaque foreign bodies in the abdomen to preclude MRI imaging. Enteric tube in place with tip and side-port below the diaphragm in the stomach. Electronically Signed   By: Keith Rake M.D.   On: 07/14/2019 22:15   Ct Head Code Stroke Wo Contrast  Result Date: 07/14/2019 CLINICAL DATA:  Code stroke. Altered level of consciousness. Seizure. EXAM: CT HEAD WITHOUT CONTRAST TECHNIQUE: Contiguous axial images were obtained from the base of the skull through the vertex without intravenous contrast. COMPARISON:  None. FINDINGS: Brain: An acute parenchymal hemorrhage in the posterior left parietal lobe measures 2.4 x 2.6 x 1.5 cm (approximately 5 cc) with mild surrounding edema. There is no midline shift or other significant mass effect. No acute infarct is identified separate from the hemorrhage. There is no extra-axial fluid collection.  The  ventricles are normal in size. Vascular: No hyperdense vessel. Skull: No fracture or focal osseous lesion. Sinuses/Orbits: Paranasal sinuses and mastoid air cells are clear. Bilateral cataract extraction. Other: None. ASPECTS [facility] Stroke Program Early CT Score) Not scored due to the presence of acute hemorrhage. IMPRESSION: 2.6 cm acute left parietal parenchymal hemorrhage. Mild edema without mass effect. Critical Value/emergent results were called by telephone at the time of interpretation on 07/14/2019 at 11:56 a.m. to Dr. Madalyn Rob, who verbally acknowledged these results. Electronically Signed   By: Logan Bores M.D.   On: 07/14/2019 12:01        Scheduled Meds:  chlorhexidine  15 mL Mouth Rinse BID   Chlorhexidine Gluconate Cloth  6 each Topical Daily   insulin aspart  0-15 Units Subcutaneous Q4H   mouth rinse  15 mL Mouth Rinse q12n4p   pantoprazole sodium  40 mg Per Tube Daily   senna-docusate  1 tablet Oral BID   Continuous Infusions:  sodium chloride 50 mL/hr at 07/16/19 0000   sodium chloride Stopped (07/14/19 2217)   dextrose 50 mL/hr at 07/16/19 0327   levETIRAcetam Stopped (07/15/19 2212)     LOS: 2 days   The patient is critically ill with multiple organ systems failure and requires high complexity decision making for assessment and support, frequent evaluation and titration of therapies, application of advanced monitoring technologies and extensive interpretation of multiple databases. Critical Care Time devoted to patient care services described in this note  Time spent: 40 minutes     Digby Groeneveld, Geraldo Docker, MD [facility] Pager [phone]  If 7PM-7AM, please contact night-coverage [email or web address] Password [facility] 07/16/2019, 8:04 AM

## 2019-07-16 NOTE — Progress Notes (Signed)
Pt transferred from 4N ICU S/P stroke, alert and oriented, denies any pain at this time, pt settled in bed with call lght at bedside, tele monitor put and verified on pt, was however reassured and will continue to monitor, safety concern addressed accordingly, v/s stable. Obasogie-Asidi, Philomena Efe

## 2019-07-16 NOTE — Progress Notes (Addendum)
STROKE TEAM PROGRESS NOTE   INTERVAL HISTORY Speech therapist at bedside.  Patient speech fluent, no seizure.  Wife at the bedside.  Discussed with patient about no driving for 6 months until seizure-free.  OBJECTIVE Vitals:   07/16/19 0817 07/16/19 1000 07/16/19 1143 07/16/19 1149  BP: 136/70 124/79  123/71  Pulse: 90   80  Resp: 18   20  Temp: 99.2 F (37.3 C)   97.6 F (36.4 C)  TempSrc: Oral   Oral  SpO2: 93%  (!) 85% 93%  Weight:      Height:        CBC:  Recent Labs  Lab 07/14/19 1104  07/15/19 0628 07/16/19 0924  WBC 9.2  --  9.8 11.7*  NEUTROABS 4.6  --   --   --   HGB 16.0   < > 13.0 12.1*  HCT 49.8   < > 39.7 34.8*  MCV 96.7  --  91.9 89.7  PLT 252  --  170 157   < > = values in this interval not displayed.    Basic Metabolic Panel:  Recent Labs  Lab 07/14/19 1155  07/14/19 1618  07/15/19 0312 07/16/19 0924  NA  --    < > 137   < > 140 137  K  --    < > 3.5   < > 3.7 4.1  CL  --   --  110  --   --  106  CO2  --   --  19*  --   --  22  GLUCOSE  --   --  89  --   --  119*  BUN  --   --  14  --   --  7*  CREATININE  --   --  1.04  --   --  1.17  CALCIUM  --   --  8.4*  --   --  8.4*  MG 1.8  --   --   --   --  1.9  PHOS 1.7*  --   --   --   --   --    < > = values in this interval not displayed.    Lipid Panel:     Component Value Date/Time   CHOL 165 06/11/2007 1228   TRIG 141 07/14/2019 1155   HDL 34.9 (L) 06/11/2007 1228   CHOLHDL 4.7 CALC 06/11/2007 1228   VLDL 35 06/11/2007 1228   LDLCALC 95 06/11/2007 1228   HgbA1c:  Lab Results  Component Value Date   HGBA1C 5.2 07/14/2019   Urine Drug Screen:     Component Value Date/Time   LABOPIA NONE DETECTED 07/14/2019 1155   COCAINSCRNUR NONE DETECTED 07/14/2019 1155   LABBENZ NONE DETECTED 07/14/2019 1155   AMPHETMU NONE DETECTED 07/14/2019 1155   THCU NONE DETECTED 07/14/2019 1155   LABBARB NONE DETECTED 07/14/2019 1155    Alcohol Level     Component Value Date/Time   ETH <10  07/14/2019 1217    IMAGING Ct Head Code Stroke Wo Contrast 07/14/2019 IMPRESSION: 2.6 cm acute left parietal parenchymal hemorrhage. Mild edema without mass effect.  Ct Angio Head W Or Wo Contrast Ct Angio Neck W Or Wo Contrast 07/14/2019 IMPRESSION:  No large vessel occlusion, stenosis, or significant atherosclerosis in the head and neck.   Ct Head Wo Contrast 07/15/2019 IMPRESSION:  1. Unchanged size of posterior left parietal hemorrhage with surrounding edema.  2. Possible tiny hemorrhage in the dependent left  lateral ventricle. No other change from prior exam.   Mr [name] Wo Contrast 07/15/2019 IMPRESSION:  1. Stable size and morphology of intra-axial hemorrhage centered at the left parietal convexity. Mild localized edema without significant regional mass effect. No underlying mass lesion, abnormal enhancement, or other structural abnormality identified.  2. No other acute intracranial process.  3. Underlying mild to moderate chronic microvascular ischemic disease.   ECG - SB rate 59 BPM. (See cardiology reading for complete details)  EEG 07/14/19 IMPRESSION: This study showed evidence of epileptogenicity in left frontal area. There was also severe diffuse encephalopathy, which could be secondary to sedated state. No seizures were seen throughout the recording. The excessive beta activity seen in the background is most likely due to the effect of medications like benzodiazepine and is a benign EEG pattern.   PHYSICAL EXAM    Temp:  [97.6 F (36.4 C)-100.4 F (38 C)] 97.6 F (36.4 C) (09/22 1149) Pulse Rate:  [73-93] 80 (09/22 1149) Resp:  [8-21] 20 (09/22 1149) BP: (107-139)/(66-87) 123/71 (09/22 1149) SpO2:  [85 %-100 %] 93 % (09/22 1149) Weight:  [75.5 kg] 75.5 kg (09/22 0600)  General - Well nourished, well developed, not in acute distress  Ophthalmologic - fundi not visualized due to noncooperation.  Cardiovascular - Regular rhythm but mild  tachycardia.  Mental Status -  Level of arousal and orientation to time, place, and person were intact. Language including expression, naming, repetition, comprehension was assessed and found intact. Fund of Knowledge was assessed and was intact.  Cranial Nerves II - XII - II - Visual field intact OU. III, IV, VI - Extraocular movements intact. V - Facial sensation intact bilaterally. VII - Facial movement intact bilaterally. VIII - Hearing & vestibular intact bilaterally. X - Palate elevates symmetrically. XI - Chin turning & shoulder shrug intact bilaterally. XII - Tongue protrusion intact.  Motor Strength - The patient's strength was normal in all extremities and pronator drift was absent.  Bulk was normal and fasciculations were absent.   Motor Tone - Muscle tone was assessed at the neck and appendages and was normal.  Reflexes - The patient's reflexes were symmetrical in all extremities and he had no pathological reflexes.  Sensory - Light touch, temperature/pinprick were assessed and were symmetrical.    Coordination - The patient had normal movements in the hands and feet with no ataxia or dysmetria.  Tremor was absent.  Gait and Station - deferred.    ASSESSMENT/PLAN Mr. JOVANTE SCHLIMGEN is a 68 y.o. male with history of HTN and HLD presenting with aphasia, confusion, agitation and elevated BP.  He did not receive IV t-PA due to [place].  ICH - small acute left parietal ICH, likely related to hypertension while he was working in his garage. No AVM, tumor or CAA.  Code Stroke CT Head - 2.6 cm acute left parietal parenchymal hemorrhage. Mild edema without mass effect.  CT head - Unchanged size of posterior left parietal hemorrhage with surrounding edema. Possible tiny hemorrhage in the dependent left lateral ventricle.   MRI head - Stable size and morphology of intra-axial hemorrhage centered at the left parietal convexity. No underlying mass lesion  CTA H&N -  unremarkable  Lacey Jensen Virus 2  - negative  LDL - 95  HgbA1c - 5.2  UDS - negative  VTE prophylaxis - SCDs  No antithrombotic prior to admission, now on No antithrombotic.   Therapy recommendations:  OP PT, HH OT  Disposition:  Pending  Seizure  Tonic seizure in ER  EEG - evidence of epileptogenicity in left frontal area. There was also severe diffuse encephalopathy, which could be secondary to sedated state.  On keppra following with keppra load  Continue keppra   Seizure precautions According to [place] law, pt can not drive until seizure free for 6 months and under physician's care.  Discussed with patient and wife, they expressed understanding  Hypoglycemia, resolved  CBG as low as 54  Treated with D10 infusion  Glucose stable now  Cleared for regular diet, thin liquis  Hx of hypertension hypotension  Home BP meds: Micardis 40 mg daily  Treated with Cleviprex and levophed in ICU  SBP goal < 160   BP stable 120-130s . Long-term BP goal normotensive  Hyperlipidemia  Home Lipid lowering medication:  Fish oil  LDL 95  Continue fish oil on discharge  Other Stroke Risk Factors  Advanced age  ETOH use, advised to drink no more than 2 alcoholic beverage per day.  Other Active Problems  Hypokalemia - corrected  Hypophosphatemia - 1.7  Hospital day # 2  Neurology will sign off. Please call with questions. Pt will follow up with stroke clinic NP at [facility] in about 4 weeks. Thanks for the consult.    Rosalin Hawking, MD PhD Stroke Neurology 07/16/2019 1:50 PM    To contact Stroke Continuity provider, please refer to [email or web address] After hours, contact General Neurology

## 2019-07-16 NOTE — Evaluation (Signed)
Speech Language Pathology Evaluation Patient Details Name: Thomas Norman MRN: [id number] DOB: 06-04-1951 Today's Date: 07/16/2019 Time: [id number] SLP Time Calculation (min) (ACUTE ONLY): 29 min  Problem List:  Patient Active Problem List   Diagnosis Date Noted  . ICH (intracerebral hemorrhage) ([place]) 07/14/2019  . Hemorrhagic stroke ([place]) 07/14/2019  . Acute respiratory failure ([place])   . HYPERLIPIDEMIA 06/11/2007  . HYPERTENSION 06/11/2007  . ALLERGIC RHINITIS 06/11/2007   Past Medical History:  Past Medical History:  Diagnosis Date  . Hypertension   . Kidney calculi    Past Surgical History:  Past Surgical History:  Procedure Laterality Date  . LITHOTRIPSY    . skin cancers    . TONSILLECTOMY     HPI:  Pt is a 68 y.o. male with PMH of HTN, HLD who presented with aphasia. TPA was not administered. MRI of the brain revealed intra-axial hemorrhage centered at the left parietal convexity. He had seizure like activity in ED and was intubated with extubation on 07/15/19   Assessment / Plan / Recommendation Clinical Impression  Pt participated in speech/language/cognition evaluation with his wife present. Pt reported that he is currently retired but was still working doing various jobs prior to admission. Both parties denied the pt having any baseline speech, language or cognitive deficits. Pt reported that he believes his language skills are currently 90% back to baseline but cited difficulty with "processing things". Pt's wife expressed that the pt has been having acute changes with regards to memory and stated that his is cognition is likely 70% back to baseline.   The [facility] Cognitive Assessment 8.1 was completed to evaluate the pt's cognitive-linguistic skills. He achieved a score of 13/30 which is below the normal limits of 26 or more out of 30 and is suggestive of a moderate impairment. He demonstrated deficits in the areas of executive function, attention, mental manipulation,  divergent naming, abstract reasoning, delayed recall, and orientation to time. Pt's language skills were within functional limits with occasional word retrieval difficulty during structured tasks and his motor speech skills were within normal limits. Skilled SLP services are clinically indicated at this time to improve cognition. Pt, his wife, and nursing were educated regarding results and recommendations; all parties verbalized understanding as well as agreement with plan of care.    SLP Assessment  SLP Recommendation/Assessment: Patient needs continued Speech Lanaguage Pathology Services SLP Visit Diagnosis: Cognitive communication deficit (R41.841)    Follow Up Recommendations  Outpatient SLP    Frequency and Duration min 2x/week  2 weeks      SLP Evaluation Cognition  Overall Cognitive Status: Impaired/Different from baseline Arousal/Alertness: Awake/alert Orientation Level: Oriented to person;Oriented to place;Oriented to situation;Disoriented to time(Oriented to month and day not date or year) Attention: Focused;Sustained;Selective Focused Attention: Appears intact(Vigilance WNL: 1/1) Sustained Attention: Impaired Sustained Attention Impairment: Verbal complex(Serial 7s: 1/3) Selective Attention: Impaired Selective Attention Impairment: Verbal complex Memory: Impaired Memory Impairment: Storage deficit;Retrieval deficit;Decreased recall of new information(Immediate: 4/5; delayed: 0/5; with cues: 4/5) Awareness: Appears intact Problem Solving: Impaired Problem Solving Impairment: Verbal complex Executive Function: Reasoning;Sequencing;Organizing Reasoning: Appears intact(Abstraction: 0/1) Sequencing: Impaired Sequencing Impairment: Verbal complex(Difficulty with clock drawing: 2/3) Organizing: Impaired Organizing Impairment: Verbal complex(Backward digit span: 0/1; Forward: 1/1)       Comprehension  Auditory Comprehension Overall Auditory Comprehension: Appears within  functional limits for tasks assessed Yes/No Questions: Within Functional Limits Basic Biographical Questions: (5/5) Complex Questions: (5/5) Paragraph Comprehension (via yes/no questions): (3/4) Commands: Within Functional Limits Two Step Basic Commands: (4/4)  Multistep Basic Commands: (4/4) Complex Commands: (Trail completion: 1/1) Conversation: Complex Environmental consultant Discrimination: Within Function Limits Reading Comprehension Reading Status: Within funtional limits    Expression Expression Primary Mode of Expression: Verbal Verbal Expression Overall Verbal Expression: Appears within functional limits for tasks assessed Initiation: No impairment Automatic Speech: Counting;Day of week;Month of year(WNL) Level of Generative/Spontaneous Verbalization: Conversation Responsive: (4/5) Confrontation: Within functional limits(10/10; Lower frequency: 2/3) Convergent: (Sentence completion: 5/5) Divergent: (0/1) Pragmatics: No impairment Written Expression Dominant Hand: Right Written Expression: (Copying cube: 1/1)   Oral / Motor  Oral Motor/Sensory Function Overall Oral Motor/Sensory Function: Within functional limits Motor Speech Overall Motor Speech: Appears within functional limits for tasks assessed Respiration: Within functional limits Phonation: Normal Resonance: Within functional limits Articulation: Within functional limitis Intelligibility: Intelligible Motor Planning: Witnin functional limits Motor Speech Errors: Not applicable   Shanika I. Hardin Negus, [place], [place] Office number [phone] Pager [place] 07/16/2019, 3:26 PM

## 2019-07-17 ENCOUNTER — Inpatient Hospital Stay (HOSPITAL_COMMUNITY): Payer: Medicare Other

## 2019-07-17 LAB — URINALYSIS, ROUTINE W REFLEX MICROSCOPIC
Bilirubin Urine: NEGATIVE
Glucose, UA: NEGATIVE mg/dL
Ketones, ur: NEGATIVE mg/dL
Nitrite: POSITIVE — AB
Protein, ur: 30 mg/dL — AB
Specific Gravity, Urine: 1.018 (ref 1.005–1.030)
pH: 6 (ref 5.0–8.0)

## 2019-07-17 LAB — GLUCOSE, CAPILLARY
Glucose-Capillary: 106 mg/dL — ABNORMAL HIGH (ref 70–99)
Glucose-Capillary: 108 mg/dL — ABNORMAL HIGH (ref 70–99)
Glucose-Capillary: 118 mg/dL — ABNORMAL HIGH (ref 70–99)
Glucose-Capillary: 120 mg/dL — ABNORMAL HIGH (ref 70–99)
Glucose-Capillary: 151 mg/dL — ABNORMAL HIGH (ref 70–99)
Glucose-Capillary: 167 mg/dL — ABNORMAL HIGH (ref 70–99)

## 2019-07-17 MED ORDER — LEVETIRACETAM 500 MG PO TABS
500.0000 mg | ORAL_TABLET | Freq: Two times a day (BID) | ORAL | Status: DC
  Administered 2019-07-17 – 2019-07-19 (×5): 500 mg via ORAL
  Filled 2019-07-17 (×5): qty 1

## 2019-07-17 MED ORDER — IBUPROFEN 200 MG PO TABS
400.0000 mg | ORAL_TABLET | Freq: Once | ORAL | Status: AC
  Administered 2019-07-17: 400 mg via ORAL
  Filled 2019-07-17: qty 2

## 2019-07-17 MED ORDER — LEVOFLOXACIN 500 MG PO TABS
500.0000 mg | ORAL_TABLET | Freq: Every day | ORAL | Status: AC
  Administered 2019-07-17 – 2019-07-19 (×3): 500 mg via ORAL
  Filled 2019-07-17 (×3): qty 1

## 2019-07-17 MED ORDER — SODIUM CHLORIDE 0.9 % IV BOLUS
500.0000 mL | Freq: Once | INTRAVENOUS | Status: AC
  Administered 2019-07-18: 500 mL via INTRAVENOUS

## 2019-07-17 NOTE — Progress Notes (Addendum)
Occupational Therapy Treatment Patient Details Name: Thomas Norman MRN: [id number] DOB: [birth year] Today's Date: 07/17/2019    History of present illness [name] is a 68 y.o. Caucasian male with PMH of HTN, HLD presented as aphasia.  He had seizure like activity in ED and was intubated,  CT/MRI showed small L parietal hemorrhage.  Patient extubated morning of 07/15/19.   OT comments  Pt progressing with mobility, but continues to have cognitive deficits. Unable to pass Pillbox test. Pt has difficulty following multi-step commands as his short term memory is poor. Pt took increased time for task, but about 75% accurate with increased time. Pt with visual deficits that glasses do not correct. Pt would benefit from continued OT skilled services for ADL, mobility and safety in [place] setting vs OP OT setting for further ADL/IADL and cognitive deficits. OT following acutely.    Follow Up Recommendations  Outpatient OT;Home health OT;Supervision/Assistance - 24 hour    Equipment Recommendations  3 in 1 bedside commode    Recommendations for Other Services      Precautions / Restrictions Precautions Precautions: Fall Restrictions Weight Bearing Restrictions: No       Mobility Bed Mobility Overal bed mobility: Modified Independent             General bed mobility comments: increased time/effort   Transfers Overall transfer level: Needs assistance Equipment used: None Transfers: Sit to/from Stand Sit to Stand: Min guard         General transfer comment: min guard for safety; no physical assistance needed    Balance Overall balance assessment: Needs assistance Sitting-balance support: Feet supported Sitting balance-Leahy Scale: Good     Standing balance support: Bilateral upper extremity supported;During functional activity Standing balance-Leahy Scale: Poor                             ADL either performed or assessed with clinical judgement    ADL Overall ADL's : Needs assistance/impaired                                     Functional mobility during ADLs: Min guard;Rolling walker General ADL Comments: Pt with poor vision and decreased short term memory, poor problem solving, poor sequencing ability.     Vision   Vision Assessment?: Vision impaired- to be further tested in functional context Additional Comments: Able to scan, pt reports his glasses help, but writing on pillboxes are too small.   Perception     Praxis      Cognition Arousal/Alertness: Awake/alert Behavior During Therapy: WFL for tasks assessed/performed;Impulsive Overall Cognitive Status: Within Functional Limits for tasks assessed Area of Impairment: Following commands;Safety/judgement;Problem solving;Memory                     Memory: Decreased short-term memory Following Commands: Follows multi-step commands inconsistently;Follows multi-step commands with increased time Safety/Judgement: Decreased awareness of deficits   Problem Solving: Requires verbal cues;Difficulty sequencing General Comments: Pt performing Pillbox test for cognition, memory and problem solving. Pt performing task with increased time and verbal cues to attend to task.        Exercises     Shoulder Instructions       General Comments      Pertinent Vitals/ Pain       Pain Assessment: No/denies pain  Home Living  Prior Functioning/Environment              Frequency  Min 2X/week        Progress Toward Goals  OT Goals(current goals can now be found in the care plan section)  Progress towards OT goals: Progressing toward goals  Acute Rehab OT Goals Patient Stated Goal: to go home OT Goal Formulation: With patient/family Time For Goal Achievement: 07/29/19 Potential to Achieve Goals: Good ADL Goals Pt Will Perform Grooming: with modified independence;standing Pt Will  Perform Lower Body Dressing: with modified independence;sit to/from stand Pt Will Transfer to Toilet: with modified independence;ambulating;regular height toilet Pt Will Perform Toileting - Clothing Manipulation and hygiene: with modified independence;sit to/from stand;sitting/lateral leans Additional ADL Goal #1: Pt will demonstrate increased problem solving to perform ADL with 1-2 cues Additional ADL Goal #2: Pt will demonstrate increased awareness of right visual field to locate 4/5 grooming items without cues  Plan Discharge plan remains appropriate    Co-evaluation                 AM-PAC OT "6 Clicks" Daily Activity     Outcome Measure   Help from another person eating meals?: None Help from another person taking care of personal grooming?: A Little Help from another person toileting, which includes using toliet, bedpan, or urinal?: A Little Help from another person bathing (including washing, rinsing, drying)?: A Little Help from another person to put on and taking off regular upper body clothing?: None Help from another person to put on and taking off regular lower body clothing?: A Little 6 Click Score: 20    End of Session Equipment Utilized During Treatment: Gait belt  OT Visit Diagnosis: Unsteadiness on feet (R26.81);Other abnormalities of gait and mobility (R26.89);Muscle weakness (generalized) (M62.81);Other symptoms and signs involving cognitive function   Activity Tolerance Patient tolerated treatment well   Patient Left in chair;with call bell/phone within reach;with chair alarm set;with family/visitor present   Nurse Communication Mobility status        Time: [id number] OT Time Calculation (min): 20 min  Charges: OT General Charges $OT Visit: 1 Visit OT Treatments $Self Care/Home Management : 8-22 mins  Darryl Nestle) Marsa Aris OTR/L Acute Rehabilitation Services Pager: [phone] Office: [phone]    Audie Pinto 07/17/2019, 5:23  PM

## 2019-07-17 NOTE — Care Management Important Message (Signed)
Important Message  Patient Details  Name: EDIE BOTTI MRN: [id number] Date of Birth: 01-19-1951   Medicare Important Message Given:  Yes     Shelda Altes 07/17/2019, 2:15 PM

## 2019-07-17 NOTE — Progress Notes (Signed)
PROGRESS NOTE    Thomas Norman  [id number] DOB: 1951-02-10 DOA: 07/14/2019 PCP: No primary care provider on file.   Brief Narrative:  68 y.o. WM PMHx HTN, HLD    Presents the ER with altered mental status. Wife states patient was noted to be confused and brought to ER.  While in triage, reportedly He had a seizure at the check in desk and was rushed to Trauma B where he became combative.   He  was intubated for airway protection. BP was found to be [id number] + systolic.   Code Stroke was called. CT head was positive for a 2.6 cm acute left parietal parenchymal hemorrhage. Mild edema without mass effect  PCCM initially had the patient on the service      Subjective: Doing well no issues today walking around the unit without any specific deficits eating drinking but does have mild cough no cold no Reiger's   Assessment & Plan:   Active Problems:   ICH (intracerebral hemorrhage) (HCC)   Hemorrhagic stroke (HCC)   Acute respiratory failure (HCC)   ICH - small acute left parietal ICH, likely related to hypertension while he was working in his garage. No AVM, tumor or CAA.  Code Stroke CT Head - 2.6 cm acute left parietal parenchymal hemorrhage. Mild edema without mass effect.  CT head - Unchanged size of posterior left parietal hemorrhage with surrounding edema. Possible tiny hemorrhage in the dependent left lateral ventricle.   MRI head - Stable size and morphology of intra-axial hemorrhage centered at the left parietal convexity. No underlying mass lesion  CTA H&N - unremarkable  Lacey Jensen Virus 2  - negative  LDL - 95  HgbA1c - 5.2  UDS - negative  VTE prophylaxis - SCDs  No antithrombotic prior to admission, now on No antithrombotic.   Therapy recommendations:  OP PT, HH OT  Disposition:   Neurology has signed off  Ambulatory SPO2 not performed and does not require oxygen he will be ambulated on the unit prior to discharge   Leukocytosis low-grade fever  100.4  CXR does not show any pneumonia but shows atelectasis-monitor temperature trend and if stabilizes without significant issues can discharge a.m.  Seizure  Tonic seizure in ER  EEG - evidence of epileptogenicity in left frontal area. There was also severe diffuse encephalopathy, which could be secondary to sedated state.  On keppra following with keppra load  Continue keppra   Seizure precautions  According to [place] law, pt can not drive until seizure free for 6 months and under physician's care.  Discussed with patient and wife, they expressed understanding  Hypoglycemia, resolved  CBG were low earlier in hospital stay and now improved he is eating 75% meals  Cleared by speech therapy regular diet, thin liquis  Hx of hypertension hypotension  Home BP meds: Micardis 40 mg daily (hold) patient normotensive.  Treated with Cleviprex and levophed in ICU  SBP goal < 160   BP stable 120-130s  Long-term BP goal normotensive  Hyperlipidemia  Home Lipid lowering medication:  Fish oil  LDL 95  Continue fish oil on discharge  Other Stroke Risk Factors  Advanced age  ETOH use, advised to drink no more than 2 alcoholic beverage per day.  Other Active Problems  Hypokalemia - corrected Hypophosphatemia - 1.7   DVT prophylaxis: SCD Code Status: Full Family Communication: 9/22 wife at bedside Disposition Plan: Discharge 9/23   Consultants:  Stroke team   Procedures/Significant Events:  07/14/2019 CT Head>>  acute parenchymal hemorrhage in the posterior left parietal lobe measures 2.4 x 2.6 x 1.5 cm (approximately 5 cc) with mild surrounding edema. There is no midline shift or other significant mass effect. No acute infarct is identified separate from the hemorrhage. There is no extra-axial fluid collection. The ventricles are normal in size.  9/20 CTA Head and Neck >> No large vessel occlusion, stenosis, or significant atherosclerosis in the head and  neck.  07/14/2019 MR Brain Stable size and morphology of intra-axial hemorrhage centered at the left parietal convexity. Mild localized edema    9/20 EEG ABNORMALITY: 1. Spikes, left frontal, maximal FP1/F7 2. Continuous slow, generalized 3. Excessive beta, generalized  IMPRESSION: evidence of epileptogenicity in left frontal area.No seizures   Continuous Infusions: . sodium chloride 50 mL/hr at 07/17/19 0639  . sodium chloride Stopped (07/14/19 2217)  . dextrose 50 mL/hr at 07/16/19 0327     Objective: Vitals:   07/17/19 0005 07/17/19 0340 07/17/19 0815 07/17/19 1040  BP:  126/73 129/78 132/83  Pulse: 83 78 90 92  Resp: 16 15 18 19   Temp:  98.7 F (37.1 C) 98.8 F (37.1 C) (!) 100.4 F (38 C)  TempSrc:  Oral Oral Oral  SpO2: 91% 93% 94% 99%  Weight:      Height:        Intake/Output Summary (Last 24 hours) at 07/17/2019 1059 Last data filed at 07/17/2019 0845 Gross per 24 hour  Intake 240 ml  Output 450 ml  Net -210 ml   Filed Weights   07/14/19 1500 07/15/19 0500 07/16/19 0600  Weight: 75.2 kg 75.4 kg 75.5 kg    Examination:  Awake coherent no distress EOMI NCAT good power bilaterally no focal deficit moving all 4 limbs equally Smile symmetric Power 5/5 Reflexes 2 abdomen soft No lower extremity edema No focal deficit Chest clear  Data Reviewed: Care during the described time interval was provided by me .  I have reviewed this patient's available data, including medical history, events of note, physical examination, and all test results as part of my evaluation.   CBC: Recent Labs  Lab 07/14/19 1104  07/14/19 1316 07/14/19 1628 07/15/19 0312 07/15/19 0628 07/16/19 0924  WBC 9.2  --   --   --   --  9.8 11.7*  NEUTROABS 4.6  --   --   --   --   --   --   HGB 16.0   < > 12.2* 11.9* 11.9* 13.0 12.1*  HCT 49.8   < > 36.0* 35.0* 35.0* 39.7 34.8*  MCV 96.7  --   --   --   --  91.9 89.7  PLT 252  --   --   --   --  170 157   < > = values in  this interval not displayed.   Basic Metabolic Panel: Recent Labs  Lab 07/14/19 1104 07/14/19 1115 07/14/19 1155 07/14/19 1316 07/14/19 1618 07/14/19 1628 07/15/19 0312 07/16/19 0924  NA 147* 147*  --  138 137 139 140 137  K 3.7 3.6  --  3.7 3.5 3.3* 3.7 4.1  CL 111 114*  --   --  110  --   --  106  CO2 14*  --   --   --  19*  --   --  22  GLUCOSE 114* 105*  --   --  89  --   --  119*  BUN 16 19  --   --  14  --   --  7*  CREATININE 1.22 1.00  --   --  1.04  --   --  1.17  CALCIUM 9.9  --   --   --  8.4*  --   --  8.4*  MG  --   --  1.8  --   --   --   --  1.9  PHOS  --   --  1.7*  --   --   --   --   --    GFR: Estimated Creatinine Clearance: 66.3 mL/min (by C-G formula based on SCr of 1.17 mg/dL). Liver Function Tests: Recent Labs  Lab 07/14/19 1104  AST 24  ALT 15  ALKPHOS 101  BILITOT 0.8  PROT 7.3  ALBUMIN 4.3   No results for input(s): LIPASE, AMYLASE in the last 168 hours. No results for input(s): AMMONIA in the last 168 hours. Coagulation Profile: Recent Labs  Lab 07/14/19 1104  INR 1.2   Cardiac Enzymes: No results for input(s): CKTOTAL, CKMB, CKMBINDEX, TROPONINI in the last 168 hours. BNP (last 3 results) No results for input(s): PROBNP in the last 8760 hours. HbA1C: Recent Labs    07/14/19 1618  HGBA1C 5.2   CBG: Recent Labs  Lab 07/16/19 1649 07/16/19 2033 07/17/19 0004 07/17/19 0409 07/17/19 0827  GLUCAP 122* 126* 108* 106* 167*   Lipid Profile: Recent Labs    07/14/19 1155  TRIG 141   Thyroid Function Tests: No results for input(s): TSH, T4TOTAL, FREET4, T3FREE, THYROIDAB in the last 72 hours. Anemia Panel: No results for input(s): VITAMINB12, FOLATE, FERRITIN, TIBC, IRON, RETICCTPCT in the last 72 hours. Urine analysis:    Component Value Date/Time   COLORURINE YELLOW 07/14/2019 1155   APPEARANCEUR CLOUDY (A) 07/14/2019 1155   LABSPEC 1.016 07/14/2019 1155   PHURINE 5.0 07/14/2019 1155   GLUCOSEU NEGATIVE 07/14/2019  1155   HGBUR LARGE (A) 07/14/2019 1155   BILIRUBINUR NEGATIVE 07/14/2019 [place] 07/14/2019 1155   PROTEINUR 30 (A) 07/14/2019 1155   NITRITE NEGATIVE 07/14/2019 1155   LEUKOCYTESUR NEGATIVE 07/14/2019 1155   Sepsis Labs: @LABRCNTIP (procalcitonin:4,lacticidven:4)  ) Recent Results (from the past 240 hour(s))  SARS Coronavirus 2 [place] order, Performed in [facility] hospital lab) Nasopharyngeal Nasopharyngeal Swab     Status: None   Collection Time: 07/14/19 11:12 AM   Specimen: Nasopharyngeal Swab  Result Value Ref Range Status   SARS Coronavirus 2 NEGATIVE NEGATIVE Final    Comment: (NOTE) If result is NEGATIVE SARS-CoV-2 target nucleic acids are NOT DETECTED. The SARS-CoV-2 RNA is generally detectable in upper and lower  respiratory specimens during the acute phase of infection. The lowest  concentration of SARS-CoV-2 viral copies this assay can detect is 250  copies / mL. A negative result does not preclude SARS-CoV-2 infection  and should not be used as the sole basis for treatment or other  patient management decisions.  A negative result may occur with  improper specimen collection / handling, submission of specimen other  than nasopharyngeal swab, presence of viral mutation(s) within the  areas targeted by this assay, and inadequate number of viral copies  (<250 copies / mL). A negative result must be combined with clinical  observations, patient history, and epidemiological information. If result is POSITIVE SARS-CoV-2 target nucleic acids are DETECTED. The SARS-CoV-2 RNA is generally detectable in upper and lower  respiratory specimens dur ing the acute phase of infection.  Positive  results are indicative of  active infection with SARS-CoV-2.  Clinical  correlation with patient history and other diagnostic information is  necessary to determine patient infection status.  Positive results do  not rule out bacterial infection or co-infection with  other viruses. If result is PRESUMPTIVE POSTIVE SARS-CoV-2 nucleic acids MAY BE PRESENT.   A presumptive positive result was obtained on the submitted specimen  and confirmed on repeat testing.  While 2019 novel coronavirus  (SARS-CoV-2) nucleic acids may be present in the submitted sample  additional confirmatory testing may be necessary for epidemiological  and / or clinical management purposes  to differentiate between  SARS-CoV-2 and other Sarbecovirus currently known to infect humans.  If clinically indicated additional testing with an alternate test  methodology [phone]) is advised. The SARS-CoV-2 RNA is generally  detectable in upper and lower respiratory sp ecimens during the acute  phase of infection. The expected result is Negative. Fact Sheet for Patients:  StrictlyIdeas.no Fact Sheet for Healthcare Providers: BankingDealers.co.za This test is not yet approved or cleared by the [place] FDA and has been authorized for detection and/or diagnosis of SARS-CoV-2 by FDA under an Emergency Use Authorization (EUA).  This EUA will remain in effect (meaning this test can be used) for the duration of the COVID-19 declaration under Section 564(b)(1) of the Act, 21 U.S.C. section 360bbb-3(b)(1), unless the authorization is terminated or revoked sooner. Performed at [facility], [place] [address], [place], [place] [address]   MRSA PCR Screening     Status: None   Collection Time: 07/14/19  3:06 PM   Specimen: Nasal Mucosa; Nasopharyngeal  Result Value Ref Range Status   MRSA by PCR NEGATIVE NEGATIVE Final    Comment:        The GeneXpert MRSA Assay (FDA approved for NASAL specimens only), is one component of a comprehensive MRSA colonization surveillance program. It is not intended to diagnose MRSA infection nor to guide or monitor treatment for MRSA infections. Performed at [facility], [place] [address],  [place], [place] [address]          Radiology Studies: No results found.      Scheduled Meds: . chlorhexidine  15 mL Mouth Rinse BID  . Chlorhexidine Gluconate Cloth  6 each Topical Daily  . insulin aspart  0-15 Units Subcutaneous Q4H  . levETIRAcetam  500 mg Oral BID  . mouth rinse  15 mL Mouth Rinse q12n4p  . pantoprazole  40 mg Oral Q1200  . senna-docusate  1 tablet Oral BID   Continuous Infusions: . sodium chloride 50 mL/hr at 07/17/19 0639  . sodium chloride Stopped (07/14/19 2217)  . dextrose 50 mL/hr at 07/16/19 0327     LOS: 3 days   Time spent: 20 minutes     Nita Sells, MD [facility] Pager [phone]  If 7PM-7AM, please contact night-coverage [email or web address] Password [facility] 07/17/2019, 10:59 AM

## 2019-07-17 NOTE — Progress Notes (Signed)
Physical Therapy Treatment Patient Details Name: Thomas Norman MRN: [id number] DOB: 02-20-1951 Today's Date: 07/17/2019    History of Present Illness Thomas Norman is a 68 y.o. Caucasian male with PMH of HTN, HLD presented as aphasia.  He had seizure like activity in ED and was intubated,  CT/MRI showed small L parietal hemorrhage.  Patient extubated morning of 07/15/19.    PT Comments    Patient seen for mobility progression. This session focused on gait/stair training without AD. Pt requires min A to maintain balance while ambulating and educated to keep using RW upon d/c for decreased risk of falls. Current plan remains appropriate.     Follow Up Recommendations  Outpatient PT;Supervision/Assistance - 24 hour     Equipment Recommendations  Rolling walker with 5" wheels;Other (comment)(shower chair)    Recommendations for Other Services       Precautions / Restrictions Precautions Precautions: Fall Restrictions Weight Bearing Restrictions: No    Mobility  Bed Mobility Overal bed mobility: Modified Independent             General bed mobility comments: increased time/effort   Transfers Overall transfer level: Needs assistance Equipment used: None Transfers: Sit to/from Stand Sit to Stand: Min guard         General transfer comment: min guard for safety; no physical assistance needed  Ambulation/Gait Ambulation/Gait assistance: Min assist Gait Distance (Feet): 240 Feet Assistive device: None(assistance at trunk with gait belt ) Gait Pattern/deviations: Step-through pattern;Narrow base of support;Decreased stride length;Drifts right/left Gait velocity: decreased   General Gait Details: assistance required for balance; pt with tendency for nearly scissoring gait at times and drifting R/L; significant deviations with horizontal head turns; cues for increased cadence   Stairs Stairs: Yes Stairs assistance: Min assist Stair Management: No rails;Step to  pattern;Forwards(HHA ) Number of Stairs: 2 General stair comments: pt has no hand rails at home so practiced with HHA simulating home entrance; assist to steady    Wheelchair Mobility    Modified Rankin (Stroke Patients Only) Modified Rankin (Stroke Patients Only) Pre-Morbid Rankin Score: No symptoms Modified Rankin: Moderately severe disability     Balance Overall balance assessment: Needs assistance Sitting-balance support: Feet supported Sitting balance-Leahy Scale: Good     Standing balance support: Bilateral upper extremity supported;During functional activity Standing balance-Leahy Scale: Poor                              Cognition Arousal/Alertness: Awake/alert Behavior During Therapy: WFL for tasks assessed/performed;Impulsive Overall Cognitive Status: Within Functional Limits for tasks assessed                                        Exercises      General Comments        Pertinent Vitals/Pain Pain Assessment: No/denies pain    Home Living                      Prior Function            PT Goals (current goals can now be found in the care plan section) Acute Rehab PT Goals Patient Stated Goal: to go home Progress towards PT goals: Progressing toward goals    Frequency    Min 4X/week      PT Plan Current plan remains appropriate    Co-evaluation  AM-PAC PT "6 Clicks" Mobility   Outcome Measure  Help needed turning from your back to your side while in a flat bed without using bedrails?: None Help needed moving from lying on your back to sitting on the side of a flat bed without using bedrails?: None Help needed moving to and from a bed to a chair (including a wheelchair)?: A Little Help needed standing up from a chair using your arms (e.g., wheelchair or bedside chair)?: None Help needed to walk in hospital room?: A Little Help needed climbing 3-5 steps with a railing? : A Little 6  Click Score: 21    End of Session Equipment Utilized During Treatment: Gait belt Activity Tolerance: Patient tolerated treatment well Patient left: in chair;with call bell/phone within reach;Other (comment)(OT present end of session) Nurse Communication: Mobility status PT Visit Diagnosis: Other abnormalities of gait and mobility (R26.89);Other symptoms and signs involving the nervous system (R29.898);Unsteadiness on feet (R26.81)     Time: [id number] PT Time Calculation (min) (ACUTE ONLY): 29 min  Charges:  $Gait Training: 23-37 mins                     Earney Navy, PTA Acute Rehabilitation Services Pager: [phone] Office: [phone]     Darliss Cheney 07/17/2019, 11:22 AM

## 2019-07-18 LAB — GLUCOSE, CAPILLARY
Glucose-Capillary: 103 mg/dL — ABNORMAL HIGH (ref 70–99)
Glucose-Capillary: 108 mg/dL — ABNORMAL HIGH (ref 70–99)
Glucose-Capillary: 115 mg/dL — ABNORMAL HIGH (ref 70–99)
Glucose-Capillary: 117 mg/dL — ABNORMAL HIGH (ref 70–99)
Glucose-Capillary: 90 mg/dL (ref 70–99)
Glucose-Capillary: 96 mg/dL (ref 70–99)
Glucose-Capillary: 98 mg/dL (ref 70–99)

## 2019-07-18 LAB — CBC WITH DIFFERENTIAL/PLATELET
Abs Immature Granulocytes: 0.06 10*3/uL (ref 0.00–0.07)
Basophils Absolute: 0 10*3/uL (ref 0.0–0.1)
Basophils Relative: 0 %
Eosinophils Absolute: 0.1 10*3/uL (ref 0.0–0.5)
Eosinophils Relative: 1 %
HCT: 33.5 % — ABNORMAL LOW (ref 39.0–52.0)
Hemoglobin: 11.6 g/dL — ABNORMAL LOW (ref 13.0–17.0)
Immature Granulocytes: 1 %
Lymphocytes Relative: 9 %
Lymphs Abs: 1.1 10*3/uL (ref 0.7–4.0)
MCH: 31.1 pg (ref 26.0–34.0)
MCHC: 34.6 g/dL (ref 30.0–36.0)
MCV: 89.8 fL (ref 80.0–100.0)
Monocytes Absolute: 1 10*3/uL (ref 0.1–1.0)
Monocytes Relative: 8 %
Neutro Abs: 9.6 10*3/uL — ABNORMAL HIGH (ref 1.7–7.7)
Neutrophils Relative %: 81 %
Platelets: 153 10*3/uL (ref 150–400)
RBC: 3.73 MIL/uL — ABNORMAL LOW (ref 4.22–5.81)
RDW: 11.9 % (ref 11.5–15.5)
WBC: 11.9 10*3/uL — ABNORMAL HIGH (ref 4.0–10.5)
nRBC: 0 % (ref 0.0–0.2)

## 2019-07-18 NOTE — Progress Notes (Signed)
Physical Therapy Treatment Patient Details Name: Thomas Norman MRN: [id number] DOB: 1950-12-24 Today's Date: 07/18/2019    History of Present Illness Thomas Norman is a 68 y.o. Caucasian male with PMH of HTN, HLD presented as aphasia.  He had seizure like activity in ED and was intubated,  CT/MRI showed small L parietal hemorrhage.  Patient extubated morning of 07/15/19.    PT Comments    Patient seen for mobility progression. Pt is making progress toward PT goals and tolerated session well with HR and SpO2 WNL. Pt will continue to benefit from further skilled PT services to maximize independence and safety with mobility.     Follow Up Recommendations  Outpatient PT;Supervision/Assistance - 24 hour     Equipment Recommendations  Rolling walker with 5" wheels    Recommendations for Other Services       Precautions / Restrictions Precautions Precautions: Fall Restrictions Weight Bearing Restrictions: No    Mobility  Bed Mobility               General bed mobility comments: pt OOB in chair upon arrival  Transfers Overall transfer level: Needs assistance Equipment used: None Transfers: Sit to/from Stand Sit to Stand: Min guard         General transfer comment: min guard for safety; no physical assistance needed  Ambulation/Gait Ambulation/Gait assistance: Min guard;Min assist Gait Distance (Feet): 240 Feet Assistive device: None Gait Pattern/deviations: Step-through pattern;Decreased stride length;Drifts right/left Gait velocity: decreased   General Gait Details: pt with instability however improving since last session; grossly min guard for safety   Stairs             Wheelchair Mobility    Modified Rankin (Stroke Patients Only) Modified Rankin (Stroke Patients Only) Pre-Morbid Rankin Score: No symptoms Modified Rankin: Moderately severe disability     Balance Overall balance assessment: Needs assistance Sitting-balance support: Feet  supported Sitting balance-Leahy Scale: Good     Standing balance support: During functional activity;No upper extremity supported Standing balance-Leahy Scale: Fair                   Standardized Balance Assessment Standardized Balance Assessment : Dynamic Gait Index   Dynamic Gait Index Level Surface: Normal Change in Gait Speed: Mild Impairment Gait with Horizontal Head Turns: Mild Impairment Gait with Vertical Head Turns: Mild Impairment Gait and Pivot Turn: Normal Step Over Obstacle: Normal Step Around Obstacles: Mild Impairment Steps: Mild Impairment Total Score: 19      Cognition Arousal/Alertness: Awake/alert Behavior During Therapy: WFL for tasks assessed/performed;Impulsive Overall Cognitive Status: Impaired/Different from baseline Area of Impairment: Problem solving                         Safety/Judgement: Decreased awareness of deficits;Decreased awareness of safety   Problem Solving: Difficulty sequencing;Requires verbal cues        Exercises      General Comments        Pertinent Vitals/Pain Pain Assessment: No/denies pain    Home Living                      Prior Function            PT Goals (current goals can now be found in the care plan section) Acute Rehab PT Goals Patient Stated Goal: to go home Progress towards PT goals: Progressing toward goals    Frequency    Min 4X/week  PT Plan Current plan remains appropriate    Co-evaluation              AM-PAC PT "6 Clicks" Mobility   Outcome Measure  Help needed turning from your back to your side while in a flat bed without using bedrails?: None Help needed moving from lying on your back to sitting on the side of a flat bed without using bedrails?: None Help needed moving to and from a bed to a chair (including a wheelchair)?: A Little Help needed standing up from a chair using your arms (e.g., wheelchair or bedside chair)?: None Help needed  to walk in hospital room?: A Little Help needed climbing 3-5 steps with a railing? : A Little 6 Click Score: 21    End of Session Equipment Utilized During Treatment: Gait belt Activity Tolerance: Patient tolerated treatment well Patient left: in chair;with call bell/phone within reach;with family/visitor present Nurse Communication: Mobility status PT Visit Diagnosis: Other abnormalities of gait and mobility (R26.89);Other symptoms and signs involving the nervous system (R29.898);Unsteadiness on feet (R26.81)     Time: [id number] PT Time Calculation (min) (ACUTE ONLY): 27 min  Charges:  $Gait Training: 23-37 mins                     Earney Navy, PTA Acute Rehabilitation Services Pager: [phone] Office: [phone]     Darliss Cheney 07/18/2019, 5:17 PM

## 2019-07-18 NOTE — Progress Notes (Signed)
SLP Cancellation Note  Patient Details Name: JUWANN SWANSEN MRN: [id number] DOB: 1951/03/01   Cancelled treatment:       Reason Eval/Treat Not Completed: Patient at procedure or test/unavailable(Pt working with physical therapy at this time. SLP will f/u)  Tobie Poet I. Hardin Negus, [place], [place] Office number [phone] Pager [place] 07/18/2019, 5:31 PM

## 2019-07-18 NOTE — Plan of Care (Signed)
Patient not showing any signs or symptoms of infection at this time. 

## 2019-07-18 NOTE — Progress Notes (Signed)
PROGRESS NOTE    Thomas Norman  [id number] DOB: 1950/12/27 DOA: 07/14/2019 PCP: No primary care provider on file.   Brief Narrative:   68 y.o. WM PMHx HTN, HLD  Presents the ER with altered mental status. Wife states patient was noted to be confused and brought to ER.  While in triage, reportedly He had a seizure at the check in desk and was rushed to Trauma [facility] where he became combative.  He was intubated for airway protection. BP was found to be [id number] + systolic.  Code Stroke was called. CT head was positive for a 2.6 cm acute left parietal parenchymal hemorrhage. Mild edema without mass effect  PCCM initially had the patient on the service    Subjective:  Patient doing well in no distress eating and drinking tolerating ambulation to some degree no chest pain no fever no chills Had a high-grade fever of 103 last night-for some reason cultures were not obtained and no one was informed  Assessment & Plan:   Active Problems:   ICH (intracerebral hemorrhage) (HCC)   Hemorrhagic stroke (HCC)   Acute respiratory failure (HCC)   ICH - small acute left parietal ICH, likely related to hypertension while he was working in his garage. No AVM, tumor or CAA.  Code Stroke CT Head - 2.6 cm acute left parietal parenchymal hemorrhage. Mild edema without mass effect.  CT head - Unchanged size of posterior left parietal hemorrhage with surrounding edema. Possible tiny hemorrhage in the dependent left lateral ventricle.   MRI head - Stable size and morphology of intra-axial hemorrhage centered at the left parietal convexity. No underlying mass lesion  CTA H&N - unremarkable  Lacey Jensen Virus 2  - negative  LDL - 95  HgbA1c - 5.2  UDS - negative  VTE prophylaxis - SCDs  No antithrombotic prior to admission, now on No antithrombotic.   Therapy recommendations:  OP PT, HH OT  Disposition:   Neurology has signed off  Ambulatory SPO2 not performed and does not require oxygen he  will be ambulated on the unit prior to discharge   Leukocytosis low-grade fever 100.4 Urine cultures pending DDX has been raised in terms of whether this is central fever from stroke  CXR does not show any pneumonia, await urine culture-if he spikes another temperature he needs to be cultured with blood cultures X2 and MD should be informed-I have spoken personally with nursing regarding the same  De-escalate antibiotics a.m. and discontinue Levaquin then  Seizure  Tonic seizure in ER  EEG - evidence of epileptogenicity in left frontal area. There was also severe diffuse encephalopathy, which could be secondary to sedated state.  On keppra following with keppra load  Continue keppra   Seizure precautions  According to [place] law, pt can not drive until seizure free for 6 months and under physician's care.  Discussed with patient and wife, they expressed understanding  Hypoglycemia, resolved  CBG ranging in the 100 range, tolerating 100% meals  Cleared by speech therapy regular diet, thin liquis  Hx of hypertension hypotension  Home BP meds: Micardis 40 mg daily (hold) patient normotensive.  Treated with Cleviprex and levophed in ICU  SBP goal < 160   BP stable 120-130s  Long-term BP goal normotensive  Hyperlipidemia  Home Lipid lowering medication:  Fish oil  LDL 95  Continue fish oil on discharge  Other Stroke Risk Factors  Advanced age  ETOH use, advised to drink no more than 2 alcoholic beverage per  day.  Other Active Problems  Hypokalemia - corrected Hypophosphatemia - 1.7   DVT prophylaxis: SCD Code Status: Full Family Communication: Discussed with the patient's wife 9/24 at the bedside answered all questions Disposition Plan: Discharge possible on 9/25 or 9/26 depending on fever and fever curve   Consultants:  Stroke team   Procedures/Significant Events:  07/14/2019 CT Head>>  acute parenchymal hemorrhage in the posterior left  parietal lobe measures 2.4 x 2.6 x 1.5 cm (approximately 5 cc) with mild surrounding edema. There is no midline shift or other significant mass effect. No acute infarct is identified separate from the hemorrhage. There is no extra-axial fluid collection. The ventricles are normal in size.  9/20 CTA Head and Neck >> No large vessel occlusion, stenosis, or significant atherosclerosis in the head and neck.  07/14/2019 MR Brain Stable size and morphology of intra-axial hemorrhage centered at the left parietal convexity. Mild localized edema    9/20 EEG ABNORMALITY: 1. Spikes, left frontal, maximal FP1/F7 2. Continuous slow, generalized 3. Excessive beta, generalized  IMPRESSION: evidence of epileptogenicity in left frontal area.No seizures   Continuous Infusions: . sodium chloride 50 mL/hr at 07/17/19 0639  . sodium chloride Stopped (07/14/19 2217)  . dextrose 50 mL/hr at 07/16/19 0327     Objective: Vitals:   07/18/19 0346 07/18/19 0500 07/18/19 0805 07/18/19 1129  BP: 103/70  123/82 121/79  Pulse: 76  73 75  Resp: 15  17 17   Temp: 98.2 F (36.8 C)  97.8 F (36.6 C) 97.9 F (36.6 C)  TempSrc: Oral  Oral Oral  SpO2: 96%  97% 97%  Weight:  79.3 kg    Height:        Intake/Output Summary (Last 24 hours) at 07/18/2019 1152 Last data filed at 07/18/2019 0900 Gross per 24 hour  Intake 320 ml  Output 875 ml  Net -555 ml   Filed Weights   07/15/19 0500 07/16/19 0600 07/18/19 0500  Weight: 75.4 kg 75.5 kg 79.3 kg    Examination:  Awake in no distress ambulatory EOMI NCAT neck soft supple no thyromegaly no submandibular lymphadenopathy Chest clear no added sound no rales no rhonchi Abdomen soft nontender no rebound no guarding Muscle mass intact well-built No focal neurological deficit moving all 4 limbs equally power 5/5 sensory grossly intact did not test reflexes  Data Reviewed: Care during the described time interval was provided by me .  I have reviewed  this patient's available data, including medical history, events of note, physical examination, and all test results as part of my evaluation.   CBC: Recent Labs  Lab 07/14/19 1104  07/14/19 1628 07/15/19 0312 07/15/19 0628 07/16/19 0924 07/18/19 0832  WBC 9.2  --   --   --  9.8 11.7* 11.9*  NEUTROABS 4.6  --   --   --   --   --  9.6*  HGB 16.0   < > 11.9* 11.9* 13.0 12.1* 11.6*  HCT 49.8   < > 35.0* 35.0* 39.7 34.8* 33.5*  MCV 96.7  --   --   --  91.9 89.7 89.8  PLT 252  --   --   --  170 157 153   < > = values in this interval not displayed.   Basic Metabolic Panel: Recent Labs  Lab 07/14/19 1104 07/14/19 1115 07/14/19 1155 07/14/19 1316 07/14/19 1618 07/14/19 1628 07/15/19 0312 07/16/19 0924  NA 147* 147*  --  138 137 139 140 137  K 3.7 3.6  --  3.7 3.5 3.3* 3.7 4.1  CL 111 114*  --   --  110  --   --  106  CO2 14*  --   --   --  19*  --   --  22  GLUCOSE 114* 105*  --   --  89  --   --  119*  BUN 16 19  --   --  14  --   --  7*  CREATININE 1.22 1.00  --   --  1.04  --   --  1.17  CALCIUM 9.9  --   --   --  8.4*  --   --  8.4*  MG  --   --  1.8  --   --   --   --  1.9  PHOS  --   --  1.7*  --   --   --   --   --    GFR: Estimated Creatinine Clearance: 68.2 mL/min (by C-G formula based on SCr of 1.17 mg/dL). Liver Function Tests: Recent Labs  Lab 07/14/19 1104  AST 24  ALT 15  ALKPHOS 101  BILITOT 0.8  PROT 7.3  ALBUMIN 4.3   No results for input(s): LIPASE, AMYLASE in the last 168 hours. No results for input(s): AMMONIA in the last 168 hours. Coagulation Profile: Recent Labs  Lab 07/14/19 1104  INR 1.2   Cardiac Enzymes: No results for input(s): CKTOTAL, CKMB, CKMBINDEX, TROPONINI in the last 168 hours. BNP (last 3 results) No results for input(s): PROBNP in the last 8760 hours. HbA1C: No results for input(s): HGBA1C in the last 72 hours. CBG: Recent Labs  Lab 07/17/19 2014 07/18/19 0028 07/18/19 0421 07/18/19 0800 07/18/19 1115  GLUCAP  151* 117* 115* 108* 103*   Lipid Profile: No results for input(s): CHOL, HDL, LDLCALC, TRIG, CHOLHDL, LDLDIRECT in the last 72 hours. Thyroid Function Tests: No results for input(s): TSH, T4TOTAL, FREET4, T3FREE, THYROIDAB in the last 72 hours. Anemia Panel: No results for input(s): VITAMINB12, FOLATE, FERRITIN, TIBC, IRON, RETICCTPCT in the last 72 hours. Urine analysis:    Component Value Date/Time   COLORURINE YELLOW 07/17/2019 1520   APPEARANCEUR CLEAR 07/17/2019 1520   LABSPEC 1.018 07/17/2019 1520   PHURINE 6.0 07/17/2019 1520   GLUCOSEU NEGATIVE 07/17/2019 1520   HGBUR SMALL (A) 07/17/2019 1520   BILIRUBINUR NEGATIVE 07/17/2019 1520   KETONESUR NEGATIVE 07/17/2019 1520   PROTEINUR 30 (A) 07/17/2019 1520   NITRITE POSITIVE (A) 07/17/2019 1520   LEUKOCYTESUR MODERATE (A) 07/17/2019 1520   Sepsis Labs: @LABRCNTIP (procalcitonin:4,lacticidven:4)  ) Recent Results (from the past 240 hour(s))  SARS Coronavirus 2 [facility] order, Performed in [facility] hospital lab) Nasopharyngeal Nasopharyngeal Swab     Status: None   Collection Time: 07/14/19 11:12 AM   Specimen: Nasopharyngeal Swab  Result Value Ref Range Status   SARS Coronavirus 2 NEGATIVE NEGATIVE Final    Comment: (NOTE) If result is NEGATIVE SARS-CoV-2 target nucleic acids are NOT DETECTED. The SARS-CoV-2 RNA is generally detectable in upper and lower  respiratory specimens during the acute phase of infection. The lowest  concentration of SARS-CoV-2 viral copies this assay can detect is 250  copies / mL. A negative result does not preclude SARS-CoV-2 infection  and should not be used as the sole basis for treatment or other  patient management decisions.  A negative result may occur with  improper specimen collection / handling, submission of specimen other  than nasopharyngeal swab, presence of  viral mutation(s) within the  areas targeted by this assay, and inadequate number of viral copies  (<250 copies / mL).  A negative result must be combined with clinical  observations, patient history, and epidemiological information. If result is POSITIVE SARS-CoV-2 target nucleic acids are DETECTED. The SARS-CoV-2 RNA is generally detectable in upper and lower  respiratory specimens dur ing the acute phase of infection.  Positive  results are indicative of active infection with SARS-CoV-2.  Clinical  correlation with patient history and other diagnostic information is  necessary to determine patient infection status.  Positive results do  not rule out bacterial infection or co-infection with other viruses. If result is PRESUMPTIVE POSTIVE SARS-CoV-2 nucleic acids MAY BE PRESENT.   A presumptive positive result was obtained on the submitted specimen  and confirmed on repeat testing.  While 2019 novel coronavirus  (SARS-CoV-2) nucleic acids may be present in the submitted sample  additional confirmatory testing may be necessary for epidemiological  and / or clinical management purposes  to differentiate between  SARS-CoV-2 and other Sarbecovirus currently known to infect humans.  If clinically indicated additional testing with an alternate test  methodology [phone]) is advised. The SARS-CoV-2 RNA is generally  detectable in upper and lower respiratory sp ecimens during the acute  phase of infection. The expected result is Negative. Fact Sheet for Patients:  StrictlyIdeas.no Fact Sheet for Healthcare Providers: BankingDealers.co.za This test is not yet approved or cleared by the [place] FDA and has been authorized for detection and/or diagnosis of SARS-CoV-2 by FDA under an Emergency Use Authorization (EUA).  This EUA will remain in effect (meaning this test can be used) for the duration of the COVID-19 declaration under Section 564(b)(1) of the Act, 21 U.S.C. section 360bbb-3(b)(1), unless the authorization is terminated or revoked sooner.  Performed at [facility], [place] [address], [place], [place] [address]   MRSA PCR Screening     Status: None   Collection Time: 07/14/19  3:06 PM   Specimen: Nasal Mucosa; Nasopharyngeal  Result Value Ref Range Status   MRSA by PCR NEGATIVE NEGATIVE Final    Comment:        The GeneXpert MRSA Assay (FDA approved for NASAL specimens only), is one component of a comprehensive MRSA colonization surveillance program. It is not intended to diagnose MRSA infection nor to guide or monitor treatment for MRSA infections. Performed at [facility], [place] [address] [place], [place] [address]          Radiology Studies: Dg Chest 2 View  Result Date: 07/17/2019 CLINICAL DATA:  Fever and shortness of breath. EXAM: CHEST - 2 VIEW COMPARISON:  Chest x-ray 07/15/2019 FINDINGS: The endotracheal tube and NG tubes have been removed. The cardiac silhouette, mediastinal and hilar contours are within normal limits and stable. There are small bilateral pleural effusions and bibasilar atelectasis but no definite infiltrates, edema or worrisome pulmonary lesions. The bony thorax is intact. IMPRESSION: 1. Removal of the ET and NG tubes. 2. Small bilateral pleural effusions and bibasilar atelectasis. Electronically Signed   By: Marijo Sanes M.D.   On: 07/17/2019 12:32        Scheduled Meds: . chlorhexidine  15 mL Mouth Rinse BID  . Chlorhexidine Gluconate Cloth  6 each Topical Daily  . insulin aspart  0-15 Units Subcutaneous Q4H  . levETIRAcetam  500 mg Oral BID  . levofloxacin  500 mg Oral Daily  . mouth rinse  15 mL Mouth Rinse q12n4p  .  pantoprazole  40 mg Oral Q1200  . senna-docusate  1 tablet Oral BID   Continuous Infusions: . sodium chloride 50 mL/hr at 07/17/19 0639  . sodium chloride Stopped (07/14/19 2217)  . dextrose 50 mL/hr at 07/16/19 0327     LOS: 4 days   Time spent: 20 minutes     Nita Sells, MD [facility] Pager [phone]  If  7PM-7AM, please contact night-coverage [email or web address] Password [facility] 07/18/2019, 11:52 AM

## 2019-07-19 LAB — CBC WITH DIFFERENTIAL/PLATELET
Abs Immature Granulocytes: 0.04 10*3/uL (ref 0.00–0.07)
Basophils Absolute: 0 10*3/uL (ref 0.0–0.1)
Basophils Relative: 0 %
Eosinophils Absolute: 0.2 10*3/uL (ref 0.0–0.5)
Eosinophils Relative: 3 %
HCT: 33.9 % — ABNORMAL LOW (ref 39.0–52.0)
Hemoglobin: 11.2 g/dL — ABNORMAL LOW (ref 13.0–17.0)
Immature Granulocytes: 1 %
Lymphocytes Relative: 11 %
Lymphs Abs: 0.9 10*3/uL (ref 0.7–4.0)
MCH: 29.9 pg (ref 26.0–34.0)
MCHC: 33 g/dL (ref 30.0–36.0)
MCV: 90.4 fL (ref 80.0–100.0)
Monocytes Absolute: 0.7 10*3/uL (ref 0.1–1.0)
Monocytes Relative: 9 %
Neutro Abs: 6.2 10*3/uL (ref 1.7–7.7)
Neutrophils Relative %: 76 %
Platelets: 198 10*3/uL (ref 150–400)
RBC: 3.75 MIL/uL — ABNORMAL LOW (ref 4.22–5.81)
RDW: 11.8 % (ref 11.5–15.5)
WBC: 8.2 10*3/uL (ref 4.0–10.5)
nRBC: 0 % (ref 0.0–0.2)

## 2019-07-19 LAB — URINE CULTURE: Culture: >=100000 — AB

## 2019-07-19 LAB — GLUCOSE, CAPILLARY
Glucose-Capillary: 87 mg/dL (ref 70–99)
Glucose-Capillary: 87 mg/dL (ref 70–99)
Glucose-Capillary: 95 mg/dL (ref 70–99)

## 2019-07-19 MED ORDER — ACETAMINOPHEN 325 MG PO TABS: 650.0000 mg | ORAL_TABLET | Freq: Four times a day (QID) | ORAL | Status: DC | PRN

## 2019-07-19 MED ORDER — ALBUTEROL SULFATE (2.5 MG/3ML) 0.083% IN NEBU: 2.5000 mg | INHALATION_SOLUTION | RESPIRATORY_TRACT | 12 refills | Status: DC | PRN

## 2019-07-19 MED ORDER — AMLODIPINE BESYLATE 2.5 MG PO TABS: 2.5000 mg | ORAL_TABLET | Freq: Every day | ORAL | 11 refills | Status: DC

## 2019-07-19 MED ORDER — METOPROLOL TARTRATE 25 MG PO TABS: 25.0000 mg | ORAL_TABLET | Freq: Two times a day (BID) | ORAL | 11 refills | Status: DC

## 2019-07-19 MED ORDER — LEVOFLOXACIN 500 MG PO TABS: 500.0000 mg | ORAL_TABLET | Freq: Every day | ORAL | 0 refills | Status: DC

## 2019-07-19 MED ORDER — LEVETIRACETAM 500 MG PO TABS: 500.0000 mg | ORAL_TABLET | Freq: Two times a day (BID) | ORAL | 3 refills | Status: DC

## 2019-07-19 NOTE — Plan of Care (Signed)
Problem: Education: Goal: Knowledge of General Education information will improve Description: Including pain rating scale, medication(s)/side effects and non-pharmacologic comfort measures 07/19/2019 1250 by Myriam Forehand, RN Outcome: Adequate for Discharge 07/19/2019 1250 by Myriam Forehand, RN Outcome: Adequate for Discharge 07/19/2019 1250 by Myriam Forehand, RN Outcome: Adequate for Discharge 07/19/2019 1250 by Myriam Forehand, RN Outcome: Adequate for Discharge 07/19/2019 1249 by Myriam Forehand, RN Outcome: Adequate for Discharge 07/19/2019 1249 by Myriam Forehand, RN Outcome: Adequate for Discharge   Problem: Health Behavior/Discharge Planning: Goal: Ability to manage health-related needs will improve 07/19/2019 1250 by Myriam Forehand, RN Outcome: Adequate for Discharge 07/19/2019 1250 by Myriam Forehand, RN Outcome: Adequate for Discharge 07/19/2019 1250 by Myriam Forehand, RN Outcome: Adequate for Discharge 07/19/2019 1250 by Myriam Forehand, RN Outcome: Adequate for Discharge 07/19/2019 1249 by Myriam Forehand, RN Outcome: Adequate for Discharge 07/19/2019 1249 by Myriam Forehand, RN Outcome: Adequate for Discharge   Problem: Clinical Measurements: Goal: Ability to maintain clinical measurements within normal limits will improve 07/19/2019 1250 by Myriam Forehand, RN Outcome: Adequate for Discharge 07/19/2019 1250 by Myriam Forehand, RN Outcome: Adequate for Discharge 07/19/2019 1250 by Myriam Forehand, RN Outcome: Adequate for Discharge 07/19/2019 1250 by Myriam Forehand, RN Outcome: Adequate for Discharge 07/19/2019 1249 by Myriam Forehand, RN Outcome: Adequate for Discharge 07/19/2019 1249 by Myriam Forehand, RN Outcome: Adequate for Discharge Goal: Will remain free from infection 07/19/2019 1250 by Myriam Forehand, RN Outcome: Adequate for Discharge 07/19/2019 1250 by Myriam Forehand, RN Outcome: Adequate for Discharge 07/19/2019 1250 by Myriam Forehand, RN Outcome: Adequate for Discharge 07/19/2019 1250 by  Myriam Forehand, RN Outcome: Adequate for Discharge 07/19/2019 1249 by Myriam Forehand, RN Outcome: Adequate for Discharge 07/19/2019 1249 by Myriam Forehand, RN Outcome: Adequate for Discharge Goal: Diagnostic test results will improve 07/19/2019 1250 by Myriam Forehand, RN Outcome: Adequate for Discharge 07/19/2019 1250 by Myriam Forehand, RN Outcome: Adequate for Discharge 07/19/2019 1250 by Myriam Forehand, RN Outcome: Adequate for Discharge 07/19/2019 1250 by Myriam Forehand, RN Outcome: Adequate for Discharge 07/19/2019 1249 by Myriam Forehand, RN Outcome: Adequate for Discharge 07/19/2019 1249 by Myriam Forehand, RN Outcome: Adequate for Discharge Goal: Respiratory complications will improve 07/19/2019 1250 by Myriam Forehand, RN Outcome: Adequate for Discharge 07/19/2019 1250 by Myriam Forehand, RN Outcome: Adequate for Discharge 07/19/2019 1250 by Myriam Forehand, RN Outcome: Adequate for Discharge 07/19/2019 1250 by Myriam Forehand, RN Outcome: Adequate for Discharge 07/19/2019 1249 by Myriam Forehand, RN Outcome: Adequate for Discharge 07/19/2019 1249 by Myriam Forehand, RN Outcome: Adequate for Discharge Goal: Cardiovascular complication will be avoided 07/19/2019 1250 by Myriam Forehand, RN Outcome: Adequate for Discharge 07/19/2019 1250 by Myriam Forehand, RN Outcome: Adequate for Discharge 07/19/2019 1250 by Myriam Forehand, RN Outcome: Adequate for Discharge 07/19/2019 1250 by Myriam Forehand, RN Outcome: Adequate for Discharge 07/19/2019 1249 by Myriam Forehand, RN Outcome: Adequate for Discharge 07/19/2019 1249 by Myriam Forehand, RN Outcome: Adequate for Discharge   Problem: Activity: Goal: Risk for activity intolerance will decrease 07/19/2019 1250 by Myriam Forehand, RN Outcome: Adequate for Discharge 07/19/2019 1250 by Myriam Forehand, RN Outcome: Adequate for Discharge 07/19/2019 1250 by Myriam Forehand, RN Outcome: Adequate for Discharge 07/19/2019 1250 by Myriam Forehand, RN Outcome: Adequate for  Discharge 07/19/2019 1249 by Myriam Forehand, RN Outcome: Adequate for Discharge 07/19/2019 1249 by Myriam Forehand, RN Outcome: Adequate for Discharge   Problem: Nutrition: Goal: Adequate nutrition will be maintained 07/19/2019 1250 by Myriam Forehand, RN Outcome: Adequate for Discharge 07/19/2019 1250 by Myriam Forehand, RN Outcome: Adequate for Discharge 07/19/2019 1250 by  Myriam Forehand, RN Outcome: Adequate for Discharge 07/19/2019 1250 by Myriam Forehand, RN Outcome: Adequate for Discharge 07/19/2019 1249 by Myriam Forehand, RN Outcome: Adequate for Discharge 07/19/2019 1249 by Myriam Forehand, RN Outcome: Adequate for Discharge   Problem: Coping: Goal: Level of anxiety will decrease 07/19/2019 1250 by Myriam Forehand, RN Outcome: Adequate for Discharge 07/19/2019 1250 by Myriam Forehand, RN Outcome: Adequate for Discharge 07/19/2019 1250 by Myriam Forehand, RN Outcome: Adequate for Discharge 07/19/2019 1250 by Myriam Forehand, RN Outcome: Adequate for Discharge 07/19/2019 1249 by Myriam Forehand, RN Outcome: Adequate for Discharge 07/19/2019 1249 by Myriam Forehand, RN Outcome: Adequate for Discharge   Problem: Elimination: Goal: Will not experience complications related to bowel motility 07/19/2019 1250 by Myriam Forehand, RN Outcome: Adequate for Discharge 07/19/2019 1250 by Myriam Forehand, RN Outcome: Adequate for Discharge 07/19/2019 1250 by Myriam Forehand, RN Outcome: Adequate for Discharge 07/19/2019 1250 by Myriam Forehand, RN Outcome: Adequate for Discharge 07/19/2019 1249 by Myriam Forehand, RN Outcome: Adequate for Discharge 07/19/2019 1249 by Myriam Forehand, RN Outcome: Adequate for Discharge Goal: Will not experience complications related to urinary retention 07/19/2019 1250 by Myriam Forehand, RN Outcome: Adequate for Discharge 07/19/2019 1250 by Myriam Forehand, RN Outcome: Adequate for Discharge 07/19/2019 1250 by Myriam Forehand, RN Outcome: Adequate for Discharge 07/19/2019 1250 by Myriam Forehand,  RN Outcome: Adequate for Discharge 07/19/2019 1249 by Myriam Forehand, RN Outcome: Adequate for Discharge 07/19/2019 1249 by Myriam Forehand, RN Outcome: Adequate for Discharge   Problem: Pain Managment: Goal: General experience of comfort will improve 07/19/2019 1250 by Myriam Forehand, RN Outcome: Adequate for Discharge 07/19/2019 1250 by Myriam Forehand, RN Outcome: Adequate for Discharge 07/19/2019 1250 by Myriam Forehand, RN Outcome: Adequate for Discharge 07/19/2019 1250 by Myriam Forehand, RN Outcome: Adequate for Discharge 07/19/2019 1249 by Myriam Forehand, RN Outcome: Adequate for Discharge 07/19/2019 1249 by Myriam Forehand, RN Outcome: Adequate for Discharge   Problem: Safety: Goal: Ability to remain free from injury will improve 07/19/2019 1250 by Myriam Forehand, RN Outcome: Adequate for Discharge 07/19/2019 1250 by Myriam Forehand, RN Outcome: Adequate for Discharge 07/19/2019 1250 by Myriam Forehand, RN Outcome: Adequate for Discharge 07/19/2019 1250 by Myriam Forehand, RN Outcome: Adequate for Discharge 07/19/2019 1249 by Myriam Forehand, RN Outcome: Adequate for Discharge 07/19/2019 1249 by Myriam Forehand, RN Outcome: Adequate for Discharge   Problem: Skin Integrity: Goal: Risk for impaired skin integrity will decrease 07/19/2019 1250 by Myriam Forehand, RN Outcome: Adequate for Discharge 07/19/2019 1250 by Myriam Forehand, RN Outcome: Adequate for Discharge 07/19/2019 1250 by Myriam Forehand, RN Outcome: Adequate for Discharge 07/19/2019 1250 by Myriam Forehand, RN Outcome: Adequate for Discharge 07/19/2019 1249 by Myriam Forehand, RN Outcome: Adequate for Discharge 07/19/2019 1249 by Myriam Forehand, RN Outcome: Adequate for Discharge   Problem: Education: Goal: Knowledge of disease or condition will improve 07/19/2019 1250 by Myriam Forehand, RN Outcome: Adequate for Discharge 07/19/2019 1250 by Myriam Forehand, RN Outcome: Adequate for Discharge 07/19/2019 1250 by Myriam Forehand, RN Outcome: Adequate  for Discharge 07/19/2019 1250 by Myriam Forehand, RN Outcome: Adequate for Discharge 07/19/2019 1249 by Myriam Forehand, RN Outcome: Adequate for Discharge 07/19/2019 1249 by Myriam Forehand, RN Outcome: Adequate for Discharge Goal: Knowledge of secondary prevention will improve 07/19/2019 1250 by Myriam Forehand, RN Outcome: Adequate for Discharge 07/19/2019 1250 by Myriam Forehand, RN Outcome: Adequate for Discharge 07/19/2019 1250 by Myriam Forehand, RN Outcome: Adequate for Discharge 07/19/2019 1250 by Myriam Forehand, RN Outcome: Adequate for Discharge 07/19/2019 1249 by Myriam Forehand, RN Outcome: Adequate for Discharge 07/19/2019 1249  by Myriam Forehand, RN Outcome: Adequate for Discharge Goal: Knowledge of patient specific risk factors addressed and post discharge goals established will improve 07/19/2019 1250 by Myriam Forehand, RN Outcome: Adequate for Discharge 07/19/2019 1250 by Myriam Forehand, RN Outcome: Adequate for Discharge 07/19/2019 1250 by Myriam Forehand, RN Outcome: Adequate for Discharge 07/19/2019 1250 by Myriam Forehand, RN Outcome: Adequate for Discharge 07/19/2019 1249 by Myriam Forehand, RN Outcome: Adequate for Discharge 07/19/2019 1249 by Myriam Forehand, RN Outcome: Adequate for Discharge Goal: Individualized Educational Video(s) 07/19/2019 1250 by Myriam Forehand, RN Outcome: Adequate for Discharge 07/19/2019 1250 by Myriam Forehand, RN Outcome: Adequate for Discharge 07/19/2019 1250 by Myriam Forehand, RN Outcome: Adequate for Discharge 07/19/2019 1250 by Myriam Forehand, RN Outcome: Adequate for Discharge 07/19/2019 1249 by Myriam Forehand, RN Outcome: Adequate for Discharge 07/19/2019 1249 by Myriam Forehand, RN Outcome: Adequate for Discharge   Problem: Coping: Goal: Will verbalize positive feelings about self 07/19/2019 1250 by Myriam Forehand, RN Outcome: Adequate for Discharge 07/19/2019 1250 by Myriam Forehand, RN Outcome: Adequate for Discharge 07/19/2019 1250 by Myriam Forehand, RN Outcome:  Adequate for Discharge 07/19/2019 1250 by Myriam Forehand, RN Outcome: Adequate for Discharge 07/19/2019 1249 by Myriam Forehand, RN Outcome: Adequate for Discharge 07/19/2019 1249 by Myriam Forehand, RN Outcome: Adequate for Discharge Goal: Will identify appropriate support needs 07/19/2019 1250 by Myriam Forehand, RN Outcome: Adequate for Discharge 07/19/2019 1250 by Myriam Forehand, RN Outcome: Adequate for Discharge 07/19/2019 1250 by Myriam Forehand, RN Outcome: Adequate for Discharge 07/19/2019 1250 by Myriam Forehand, RN Outcome: Adequate for Discharge 07/19/2019 1249 by Myriam Forehand, RN Outcome: Adequate for Discharge 07/19/2019 1249 by Myriam Forehand, RN Outcome: Adequate for Discharge   Problem: Health Behavior/Discharge Planning: Goal: Ability to manage health-related needs will improve 07/19/2019 1250 by Myriam Forehand, RN Outcome: Adequate for Discharge 07/19/2019 1250 by Myriam Forehand, RN Outcome: Adequate for Discharge 07/19/2019 1250 by Myriam Forehand, RN Outcome: Adequate for Discharge 07/19/2019 1250 by Myriam Forehand, RN Outcome: Adequate for Discharge 07/19/2019 1249 by Myriam Forehand, RN Outcome: Adequate for Discharge 07/19/2019 1249 by Myriam Forehand, RN Outcome: Adequate for Discharge   Problem: Self-Care: Goal: Ability to participate in self-care as condition permits will improve 07/19/2019 1250 by Myriam Forehand, RN Outcome: Adequate for Discharge 07/19/2019 1250 by Myriam Forehand, RN Outcome: Adequate for Discharge 07/19/2019 1250 by Myriam Forehand, RN Outcome: Adequate for Discharge 07/19/2019 1250 by Myriam Forehand, RN Outcome: Adequate for Discharge 07/19/2019 1249 by Myriam Forehand, RN Outcome: Adequate for Discharge 07/19/2019 1249 by Myriam Forehand, RN Outcome: Adequate for Discharge Goal: Verbalization of feelings and concerns over difficulty with self-care will improve 07/19/2019 1250 by Myriam Forehand, RN Outcome: Adequate for Discharge 07/19/2019 1250 by Myriam Forehand,  RN Outcome: Adequate for Discharge 07/19/2019 1250 by Myriam Forehand, RN Outcome: Adequate for Discharge 07/19/2019 1250 by Myriam Forehand, RN Outcome: Adequate for Discharge 07/19/2019 1249 by Myriam Forehand, RN Outcome: Adequate for Discharge 07/19/2019 1249 by Myriam Forehand, RN Outcome: Adequate for Discharge Goal: Ability to communicate needs accurately will improve 07/19/2019 1250 by Myriam Forehand, RN Outcome: Adequate for Discharge 07/19/2019 1250 by Myriam Forehand, RN Outcome: Adequate for Discharge 07/19/2019 1250 by Myriam Forehand, RN Outcome: Adequate for Discharge 07/19/2019 1250 by Myriam Forehand, RN Outcome: Adequate for Discharge 07/19/2019 1249 by Myriam Forehand, RN Outcome: Adequate for Discharge 07/19/2019 1249 by Myriam Forehand, RN Outcome: Adequate for Discharge   Problem: Nutrition: Goal: Risk of aspiration will decrease 07/19/2019 1250 by Myriam Forehand, RN Outcome: Adequate for Discharge 07/19/2019 1250 by Myriam Forehand,  RN Outcome: Adequate for Discharge 07/19/2019 1250 by Myriam Forehand, RN Outcome: Adequate for Discharge 07/19/2019 1250 by Myriam Forehand, RN Outcome: Adequate for Discharge 07/19/2019 1249 by Myriam Forehand, RN Outcome: Adequate for Discharge 07/19/2019 1249 by Myriam Forehand, RN Outcome: Adequate for Discharge Goal: Dietary intake will improve 07/19/2019 1250 by Myriam Forehand, RN Outcome: Adequate for Discharge 07/19/2019 1250 by Myriam Forehand, RN Outcome: Adequate for Discharge 07/19/2019 1250 by Myriam Forehand, RN Outcome: Adequate for Discharge 07/19/2019 1250 by Myriam Forehand, RN Outcome: Adequate for Discharge 07/19/2019 1249 by Myriam Forehand, RN Outcome: Adequate for Discharge 07/19/2019 1249 by Myriam Forehand, RN Outcome: Adequate for Discharge   Problem: Intracerebral Hemorrhage Tissue Perfusion: Goal: Complications of Intracerebral Hemorrhage will be minimized 07/19/2019 1250 by Myriam Forehand, RN Outcome: Adequate for Discharge 07/19/2019 1250 by  Myriam Forehand, RN Outcome: Adequate for Discharge 07/19/2019 1250 by Myriam Forehand, RN Outcome: Adequate for Discharge 07/19/2019 1250 by Myriam Forehand, RN Outcome: Adequate for Discharge 07/19/2019 1249 by Myriam Forehand, RN Outcome: Adequate for Discharge 07/19/2019 1249 by Myriam Forehand, RN Outcome: Adequate for Discharge

## 2019-07-19 NOTE — Plan of Care (Signed)
Problem: Education: Goal: Knowledge of General Education information will improve Description: Including pain rating scale, medication(s)/side effects and non-pharmacologic comfort measures 07/19/2019 1250 by Myriam Forehand, RN Outcome: Adequate for Discharge 07/19/2019 1249 by Myriam Forehand, RN Outcome: Adequate for Discharge 07/19/2019 1249 by Myriam Forehand, RN Outcome: Adequate for Discharge   Problem: Health Behavior/Discharge Planning: Goal: Ability to manage health-related needs will improve 07/19/2019 1250 by Myriam Forehand, RN Outcome: Adequate for Discharge 07/19/2019 1249 by Myriam Forehand, RN Outcome: Adequate for Discharge 07/19/2019 1249 by Myriam Forehand, RN Outcome: Adequate for Discharge   Problem: Clinical Measurements: Goal: Ability to maintain clinical measurements within normal limits will improve 07/19/2019 1250 by Myriam Forehand, RN Outcome: Adequate for Discharge 07/19/2019 1249 by Myriam Forehand, RN Outcome: Adequate for Discharge 07/19/2019 1249 by Myriam Forehand, RN Outcome: Adequate for Discharge Goal: Will remain free from infection 07/19/2019 1250 by Myriam Forehand, RN Outcome: Adequate for Discharge 07/19/2019 1249 by Myriam Forehand, RN Outcome: Adequate for Discharge 07/19/2019 1249 by Myriam Forehand, RN Outcome: Adequate for Discharge Goal: Diagnostic test results will improve 07/19/2019 1250 by Myriam Forehand, RN Outcome: Adequate for Discharge 07/19/2019 1249 by Myriam Forehand, RN Outcome: Adequate for Discharge 07/19/2019 1249 by Myriam Forehand, RN Outcome: Adequate for Discharge Goal: Respiratory complications will improve 07/19/2019 1250 by Myriam Forehand, RN Outcome: Adequate for Discharge 07/19/2019 1249 by Myriam Forehand, RN Outcome: Adequate for Discharge 07/19/2019 1249 by Myriam Forehand, RN Outcome: Adequate for Discharge Goal: Cardiovascular complication will be avoided 07/19/2019 1250 by Myriam Forehand, RN Outcome: Adequate for Discharge 07/19/2019 1249 by  Myriam Forehand, RN Outcome: Adequate for Discharge 07/19/2019 1249 by Myriam Forehand, RN Outcome: Adequate for Discharge   Problem: Activity: Goal: Risk for activity intolerance will decrease 07/19/2019 1250 by Myriam Forehand, RN Outcome: Adequate for Discharge 07/19/2019 1249 by Myriam Forehand, RN Outcome: Adequate for Discharge 07/19/2019 1249 by Myriam Forehand, RN Outcome: Adequate for Discharge   Problem: Nutrition: Goal: Adequate nutrition will be maintained 07/19/2019 1250 by Myriam Forehand, RN Outcome: Adequate for Discharge 07/19/2019 1249 by Myriam Forehand, RN Outcome: Adequate for Discharge 07/19/2019 1249 by Myriam Forehand, RN Outcome: Adequate for Discharge   Problem: Coping: Goal: Level of anxiety will decrease 07/19/2019 1250 by Myriam Forehand, RN Outcome: Adequate for Discharge 07/19/2019 1249 by Myriam Forehand, RN Outcome: Adequate for Discharge 07/19/2019 1249 by Myriam Forehand, RN Outcome: Adequate for Discharge   Problem: Elimination: Goal: Will not experience complications related to bowel motility 07/19/2019 1250 by Myriam Forehand, RN Outcome: Adequate for Discharge 07/19/2019 1249 by Myriam Forehand, RN Outcome: Adequate for Discharge 07/19/2019 1249 by Myriam Forehand, RN Outcome: Adequate for Discharge Goal: Will not experience complications related to urinary retention 07/19/2019 1250 by Myriam Forehand, RN Outcome: Adequate for Discharge 07/19/2019 1249 by Myriam Forehand, RN Outcome: Adequate for Discharge 07/19/2019 1249 by Myriam Forehand, RN Outcome: Adequate for Discharge   Problem: Pain Managment: Goal: General experience of comfort will improve 07/19/2019 1250 by Myriam Forehand, RN Outcome: Adequate for Discharge 07/19/2019 1249 by Myriam Forehand, RN Outcome: Adequate for Discharge 07/19/2019 1249 by Myriam Forehand, RN Outcome: Adequate for Discharge   Problem: Safety: Goal: Ability to remain free from injury will improve 07/19/2019 1250 by Myriam Forehand, RN Outcome:  Adequate for Discharge 07/19/2019 1249 by Myriam Forehand, RN Outcome: Adequate for Discharge 07/19/2019 1249 by Myriam Forehand, RN Outcome: Adequate for Discharge   Problem: Skin Integrity: Goal: Risk for impaired skin integrity will decrease 07/19/2019 1250 by Myriam Forehand, RN Outcome: Adequate for Discharge 07/19/2019 1249 by  Myriam Forehand, RN Outcome: Adequate for Discharge 07/19/2019 1249 by Myriam Forehand, RN Outcome: Adequate for Discharge   Problem: Education: Goal: Knowledge of disease or condition will improve 07/19/2019 1250 by Myriam Forehand, RN Outcome: Adequate for Discharge 07/19/2019 1249 by Myriam Forehand, RN Outcome: Adequate for Discharge 07/19/2019 1249 by Myriam Forehand, RN Outcome: Adequate for Discharge Goal: Knowledge of secondary prevention will improve 07/19/2019 1250 by Myriam Forehand, RN Outcome: Adequate for Discharge 07/19/2019 1249 by Myriam Forehand, RN Outcome: Adequate for Discharge 07/19/2019 1249 by Myriam Forehand, RN Outcome: Adequate for Discharge Goal: Knowledge of patient specific risk factors addressed and post discharge goals established will improve 07/19/2019 1250 by Myriam Forehand, RN Outcome: Adequate for Discharge 07/19/2019 1249 by Myriam Forehand, RN Outcome: Adequate for Discharge 07/19/2019 1249 by Myriam Forehand, RN Outcome: Adequate for Discharge Goal: Individualized Educational Video(s) 07/19/2019 1250 by Myriam Forehand, RN Outcome: Adequate for Discharge 07/19/2019 1249 by Myriam Forehand, RN Outcome: Adequate for Discharge 07/19/2019 1249 by Myriam Forehand, RN Outcome: Adequate for Discharge   Problem: Coping: Goal: Will verbalize positive feelings about self 07/19/2019 1250 by Myriam Forehand, RN Outcome: Adequate for Discharge 07/19/2019 1249 by Myriam Forehand, RN Outcome: Adequate for Discharge 07/19/2019 1249 by Myriam Forehand, RN Outcome: Adequate for Discharge Goal: Will identify appropriate support needs 07/19/2019 1250 by Myriam Forehand,  RN Outcome: Adequate for Discharge 07/19/2019 1249 by Myriam Forehand, RN Outcome: Adequate for Discharge 07/19/2019 1249 by Myriam Forehand, RN Outcome: Adequate for Discharge   Problem: Health Behavior/Discharge Planning: Goal: Ability to manage health-related needs will improve 07/19/2019 1250 by Myriam Forehand, RN Outcome: Adequate for Discharge 07/19/2019 1249 by Myriam Forehand, RN Outcome: Adequate for Discharge 07/19/2019 1249 by Myriam Forehand, RN Outcome: Adequate for Discharge   Problem: Self-Care: Goal: Ability to participate in self-care as condition permits will improve 07/19/2019 1250 by Myriam Forehand, RN Outcome: Adequate for Discharge 07/19/2019 1249 by Myriam Forehand, RN Outcome: Adequate for Discharge 07/19/2019 1249 by Myriam Forehand, RN Outcome: Adequate for Discharge Goal: Verbalization of feelings and concerns over difficulty with self-care will improve 07/19/2019 1250 by Myriam Forehand, RN Outcome: Adequate for Discharge 07/19/2019 1249 by Myriam Forehand, RN Outcome: Adequate for Discharge 07/19/2019 1249 by Myriam Forehand, RN Outcome: Adequate for Discharge Goal: Ability to communicate needs accurately will improve 07/19/2019 1250 by Myriam Forehand, RN Outcome: Adequate for Discharge 07/19/2019 1249 by Myriam Forehand, RN Outcome: Adequate for Discharge 07/19/2019 1249 by Myriam Forehand, RN Outcome: Adequate for Discharge   Problem: Nutrition: Goal: Risk of aspiration will decrease 07/19/2019 1250 by Myriam Forehand, RN Outcome: Adequate for Discharge 07/19/2019 1249 by Myriam Forehand, RN Outcome: Adequate for Discharge 07/19/2019 1249 by Myriam Forehand, RN Outcome: Adequate for Discharge Goal: Dietary intake will improve 07/19/2019 1250 by Myriam Forehand, RN Outcome: Adequate for Discharge 07/19/2019 1249 by Myriam Forehand, RN Outcome: Adequate for Discharge 07/19/2019 1249 by Myriam Forehand, RN Outcome: Adequate for Discharge   Problem: Intracerebral Hemorrhage Tissue  Perfusion: Goal: Complications of Intracerebral Hemorrhage will be minimized 07/19/2019 1250 by Myriam Forehand, RN Outcome: Adequate for Discharge 07/19/2019 1249 by Myriam Forehand, RN Outcome: Adequate for Discharge 07/19/2019 1249 by Myriam Forehand, RN Outcome: Adequate for Discharge

## 2019-07-19 NOTE — Plan of Care (Signed)
Problem: Education: Goal: Knowledge of General Education information will improve Description: Including pain rating scale, medication(s)/side effects and non-pharmacologic comfort measures 07/19/2019 1250 by Myriam Forehand, RN Outcome: Adequate for Discharge 07/19/2019 1250 by Myriam Forehand, RN Outcome: Adequate for Discharge 07/19/2019 1249 by Myriam Forehand, RN Outcome: Adequate for Discharge 07/19/2019 1249 by Myriam Forehand, RN Outcome: Adequate for Discharge   Problem: Health Behavior/Discharge Planning: Goal: Ability to manage health-related needs will improve 07/19/2019 1250 by Myriam Forehand, RN Outcome: Adequate for Discharge 07/19/2019 1250 by Myriam Forehand, RN Outcome: Adequate for Discharge 07/19/2019 1249 by Myriam Forehand, RN Outcome: Adequate for Discharge 07/19/2019 1249 by Myriam Forehand, RN Outcome: Adequate for Discharge   Problem: Clinical Measurements: Goal: Ability to maintain clinical measurements within normal limits will improve 07/19/2019 1250 by Myriam Forehand, RN Outcome: Adequate for Discharge 07/19/2019 1250 by Myriam Forehand, RN Outcome: Adequate for Discharge 07/19/2019 1249 by Myriam Forehand, RN Outcome: Adequate for Discharge 07/19/2019 1249 by Myriam Forehand, RN Outcome: Adequate for Discharge Goal: Will remain free from infection 07/19/2019 1250 by Myriam Forehand, RN Outcome: Adequate for Discharge 07/19/2019 1250 by Myriam Forehand, RN Outcome: Adequate for Discharge 07/19/2019 1249 by Myriam Forehand, RN Outcome: Adequate for Discharge 07/19/2019 1249 by Myriam Forehand, RN Outcome: Adequate for Discharge Goal: Diagnostic test results will improve 07/19/2019 1250 by Myriam Forehand, RN Outcome: Adequate for Discharge 07/19/2019 1250 by Myriam Forehand, RN Outcome: Adequate for Discharge 07/19/2019 1249 by Myriam Forehand, RN Outcome: Adequate for Discharge 07/19/2019 1249 by Myriam Forehand, RN Outcome: Adequate for Discharge Goal: Respiratory complications will  improve 07/19/2019 1250 by Myriam Forehand, RN Outcome: Adequate for Discharge 07/19/2019 1250 by Myriam Forehand, RN Outcome: Adequate for Discharge 07/19/2019 1249 by Myriam Forehand, RN Outcome: Adequate for Discharge 07/19/2019 1249 by Myriam Forehand, RN Outcome: Adequate for Discharge Goal: Cardiovascular complication will be avoided 07/19/2019 1250 by Myriam Forehand, RN Outcome: Adequate for Discharge 07/19/2019 1250 by Myriam Forehand, RN Outcome: Adequate for Discharge 07/19/2019 1249 by Myriam Forehand, RN Outcome: Adequate for Discharge 07/19/2019 1249 by Myriam Forehand, RN Outcome: Adequate for Discharge   Problem: Activity: Goal: Risk for activity intolerance will decrease 07/19/2019 1250 by Myriam Forehand, RN Outcome: Adequate for Discharge 07/19/2019 1250 by Myriam Forehand, RN Outcome: Adequate for Discharge 07/19/2019 1249 by Myriam Forehand, RN Outcome: Adequate for Discharge 07/19/2019 1249 by Myriam Forehand, RN Outcome: Adequate for Discharge   Problem: Nutrition: Goal: Adequate nutrition will be maintained 07/19/2019 1250 by Myriam Forehand, RN Outcome: Adequate for Discharge 07/19/2019 1250 by Myriam Forehand, RN Outcome: Adequate for Discharge 07/19/2019 1249 by Myriam Forehand, RN Outcome: Adequate for Discharge 07/19/2019 1249 by Myriam Forehand, RN Outcome: Adequate for Discharge   Problem: Coping: Goal: Level of anxiety will decrease 07/19/2019 1250 by Myriam Forehand, RN Outcome: Adequate for Discharge 07/19/2019 1250 by Myriam Forehand, RN Outcome: Adequate for Discharge 07/19/2019 1249 by Myriam Forehand, RN Outcome: Adequate for Discharge 07/19/2019 1249 by Myriam Forehand, RN Outcome: Adequate for Discharge   Problem: Elimination: Goal: Will not experience complications related to bowel motility 07/19/2019 1250 by Myriam Forehand, RN Outcome: Adequate for Discharge 07/19/2019 1250 by Myriam Forehand, RN Outcome: Adequate for Discharge 07/19/2019 1249 by Myriam Forehand, RN Outcome: Adequate  for Discharge 07/19/2019 1249 by Myriam Forehand, RN Outcome: Adequate for Discharge Goal: Will not experience complications related to urinary retention 07/19/2019 1250 by Myriam Forehand, RN Outcome: Adequate for Discharge 07/19/2019 1250 by Myriam Forehand, RN Outcome: Adequate for Discharge 07/19/2019 1249 by Myriam Forehand, RN Outcome: Adequate for Discharge 07/19/2019  1249 by Myriam Forehand, RN Outcome: Adequate for Discharge   Problem: Pain Managment: Goal: General experience of comfort will improve 07/19/2019 1250 by Myriam Forehand, RN Outcome: Adequate for Discharge 07/19/2019 1250 by Myriam Forehand, RN Outcome: Adequate for Discharge 07/19/2019 1249 by Myriam Forehand, RN Outcome: Adequate for Discharge 07/19/2019 1249 by Myriam Forehand, RN Outcome: Adequate for Discharge   Problem: Safety: Goal: Ability to remain free from injury will improve 07/19/2019 1250 by Myriam Forehand, RN Outcome: Adequate for Discharge 07/19/2019 1250 by Myriam Forehand, RN Outcome: Adequate for Discharge 07/19/2019 1249 by Myriam Forehand, RN Outcome: Adequate for Discharge 07/19/2019 1249 by Myriam Forehand, RN Outcome: Adequate for Discharge   Problem: Skin Integrity: Goal: Risk for impaired skin integrity will decrease 07/19/2019 1250 by Myriam Forehand, RN Outcome: Adequate for Discharge 07/19/2019 1250 by Myriam Forehand, RN Outcome: Adequate for Discharge 07/19/2019 1249 by Myriam Forehand, RN Outcome: Adequate for Discharge 07/19/2019 1249 by Myriam Forehand, RN Outcome: Adequate for Discharge   Problem: Education: Goal: Knowledge of disease or condition will improve 07/19/2019 1250 by Myriam Forehand, RN Outcome: Adequate for Discharge 07/19/2019 1250 by Myriam Forehand, RN Outcome: Adequate for Discharge 07/19/2019 1249 by Myriam Forehand, RN Outcome: Adequate for Discharge 07/19/2019 1249 by Myriam Forehand, RN Outcome: Adequate for Discharge Goal: Knowledge of secondary prevention will improve 07/19/2019 1250 by Myriam Forehand, RN Outcome: Adequate for Discharge 07/19/2019 1250 by Myriam Forehand, RN Outcome: Adequate for Discharge 07/19/2019 1249 by Myriam Forehand, RN Outcome: Adequate for Discharge 07/19/2019 1249 by Myriam Forehand, RN Outcome: Adequate for Discharge Goal: Knowledge of patient specific risk factors addressed and post discharge goals established will improve 07/19/2019 1250 by Myriam Forehand, RN Outcome: Adequate for Discharge 07/19/2019 1250 by Myriam Forehand, RN Outcome: Adequate for Discharge 07/19/2019 1249 by Myriam Forehand, RN Outcome: Adequate for Discharge 07/19/2019 1249 by Myriam Forehand, RN Outcome: Adequate for Discharge Goal: Individualized Educational Video(s) 07/19/2019 1250 by Myriam Forehand, RN Outcome: Adequate for Discharge 07/19/2019 1250 by Myriam Forehand, RN Outcome: Adequate for Discharge 07/19/2019 1249 by Myriam Forehand, RN Outcome: Adequate for Discharge 07/19/2019 1249 by Myriam Forehand, RN Outcome: Adequate for Discharge   Problem: Coping: Goal: Will verbalize positive feelings about self 07/19/2019 1250 by Myriam Forehand, RN Outcome: Adequate for Discharge 07/19/2019 1250 by Myriam Forehand, RN Outcome: Adequate for Discharge 07/19/2019 1249 by Myriam Forehand, RN Outcome: Adequate for Discharge 07/19/2019 1249 by Myriam Forehand, RN Outcome: Adequate for Discharge Goal: Will identify appropriate support needs 07/19/2019 1250 by Myriam Forehand, RN Outcome: Adequate for Discharge 07/19/2019 1250 by Myriam Forehand, RN Outcome: Adequate for Discharge 07/19/2019 1249 by Myriam Forehand, RN Outcome: Adequate for Discharge 07/19/2019 1249 by Myriam Forehand, RN Outcome: Adequate for Discharge   Problem: Health Behavior/Discharge Planning: Goal: Ability to manage health-related needs will improve 07/19/2019 1250 by Myriam Forehand, RN Outcome: Adequate for Discharge 07/19/2019 1250 by Myriam Forehand, RN Outcome: Adequate for Discharge 07/19/2019 1249 by Myriam Forehand, RN Outcome:  Adequate for Discharge 07/19/2019 1249 by Myriam Forehand, RN Outcome: Adequate for Discharge   Problem: Self-Care: Goal: Ability to participate in self-care as condition permits will improve 07/19/2019 1250 by Myriam Forehand, RN Outcome: Adequate for Discharge 07/19/2019 1250 by Myriam Forehand, RN Outcome: Adequate for Discharge 07/19/2019 1249 by Myriam Forehand, RN Outcome: Adequate for Discharge 07/19/2019 1249 by Myriam Forehand, RN Outcome: Adequate for Discharge Goal: Verbalization of feelings and concerns over difficulty with self-care will improve 07/19/2019 1250 by Myriam Forehand, RN Outcome: Adequate for Discharge 07/19/2019 1250 by Myriam Forehand,  RN Outcome: Adequate for Discharge 07/19/2019 1249 by Myriam Forehand, RN Outcome: Adequate for Discharge 07/19/2019 1249 by Myriam Forehand, RN Outcome: Adequate for Discharge Goal: Ability to communicate needs accurately will improve 07/19/2019 1250 by Myriam Forehand, RN Outcome: Adequate for Discharge 07/19/2019 1250 by Myriam Forehand, RN Outcome: Adequate for Discharge 07/19/2019 1249 by Myriam Forehand, RN Outcome: Adequate for Discharge 07/19/2019 1249 by Myriam Forehand, RN Outcome: Adequate for Discharge   Problem: Nutrition: Goal: Risk of aspiration will decrease 07/19/2019 1250 by Myriam Forehand, RN Outcome: Adequate for Discharge 07/19/2019 1250 by Myriam Forehand, RN Outcome: Adequate for Discharge 07/19/2019 1249 by Myriam Forehand, RN Outcome: Adequate for Discharge 07/19/2019 1249 by Myriam Forehand, RN Outcome: Adequate for Discharge Goal: Dietary intake will improve 07/19/2019 1250 by Myriam Forehand, RN Outcome: Adequate for Discharge 07/19/2019 1250 by Myriam Forehand, RN Outcome: Adequate for Discharge 07/19/2019 1249 by Myriam Forehand, RN Outcome: Adequate for Discharge 07/19/2019 1249 by Myriam Forehand, RN Outcome: Adequate for Discharge   Problem: Intracerebral Hemorrhage Tissue Perfusion: Goal: Complications of Intracerebral Hemorrhage  will be minimized 07/19/2019 1250 by Myriam Forehand, RN Outcome: Adequate for Discharge 07/19/2019 1250 by Myriam Forehand, RN Outcome: Adequate for Discharge 07/19/2019 1249 by Myriam Forehand, RN Outcome: Adequate for Discharge 07/19/2019 1249 by Myriam Forehand, RN Outcome: Adequate for Discharge

## 2019-07-19 NOTE — Discharge Summary (Signed)
Physician Discharge Summary  Thomas Norman [id number] DOB: [birth year] DOA: 07/14/2019  PCP: No primary care provider on file.  Admit date: 07/14/2019 Discharge date: 07/19/2019  Time spent: 35 minutes  Recommendations for Outpatient Follow-up:  1. Needs to complete 2 more days of oral Levaquin to complete 5 days of treatment for E. coli urinary infection 2. Cannot drive for 6 months or unless cleared by PCP/neurologist 3. Requires outpatient neurology/OT/PT/speech therapy and should follow-up with [facility] for stroke care 4. Recommend Keppra for seizure prevention 5. Consider addition of Flomax/finasteride for LUTS as an outpatient I have not started this med  Discharge Diagnoses:  Active Problems:   ICH (intracerebral hemorrhage) ([place])   Hemorrhagic stroke ([place])   Acute respiratory failure ([place])   Discharge Condition: Improved  Diet recommendation: Heart healthy  Filed Weights   07/16/19 0600 07/18/19 0500 07/19/19 0500  Weight: 75.5 kg 79.3 kg 79.4 kg    History of present illness:  68 y.o.WM PMHx HTN, HLD Presents the ER with altered mental status.Wife states patient was noted to be confused and brought to ER. While in triage, reportedly He had a seizure at the check in desk and was rushed to Trauma B where he became combative.  He was intubated for airway protection. BP was found to be [id number] + systolic.  Code Stroke was called. CT head was positive for a 2.6 cm acute left parietal parenchymal hemorrhage. Mild edema without mass effectPCCM initially had the patient on the service   Hospital Course:  Intracranial hemorrhage-left ICH 2/2 HTN no tumor or AVM-code stroke called 2.6 cm acute left parietal parenchymal hemorrhage MRI confirmed this with no underlying mass lesion-neurology recommended Keppra prophylactically no driving because of seizure in addition to outpatient PT, home health OT and will need to be followed closely for LDL in 3 months (was 95), HbA1c was  5.2 in the outpatient setting  LUTS + E. coli cystitis-found to have low-grade fevers in hospital while here and was Rx for this with IV Levaquin because of severe sulfa allergies-patient will complete 2 more days of this in the outpatient setting-chest x-rays and other work-up did not reveal any other findings  Seizure-see above EEG showed some epileptogenic city in the left frontal area  Hypoglycemia with A1c 5.2 Covered with sliding scale in the hospital monitor  Hypertension with mild hypotension at times-in ICU was treated with Cleviprex etc.-going home on low-dose Norvasc 2.5 in addition to metoprolol as per [facility] for secondary prevention control  Hyperlipidemia sending home on fish oil   Procedures:  MRIs, CTs   Consultations:  Neurology, ICU service  Discharge Exam: Vitals:   07/19/19 0310 07/19/19 0805  BP: 131/87 (!) 149/84  Pulse: 77 88  Resp: 18 17  Temp: 98.3 F (36.8 C) 97.7 F (36.5 C)  SpO2: 99% 100%    General: Awake coherent pleasant no distress EOMI NCAT no focal neurological deficit did not eat breakfast today does not seem to like food however has been ambulatory and has not had a fever today Cardiovascular: S1-S2 no murmur rub or gallop Respiratory: Clinically clear no added sound no rales no rhonchi Abdomen soft nontender no rebound Moving all 4 limbs equally without deficit Skin soft supple Neurologically intact power 5/5 sensory intact  Discharge Instructions   Discharge Instructions    Ambulatory referral to Neurology   Complete by: As directed    Follow up with stroke clinic NP (Jessica Vanschaick or Cecille Rubin, if both not available, consider Leonie Man,  Penumali, or Ahern) at [facility] in about 4 weeks. Thanks.   Ambulatory referral to Occupational Therapy   Complete by: As directed    Ambulatory referral to Physical Therapy   Complete by: As directed    Ambulatory referral to Speech Therapy   Complete by: As directed      Allergies as of  07/19/2019      Reactions   Sulfonamide Derivatives Rash      Medication List    STOP taking these medications   telmisartan 40 MG tablet Commonly known as: MICARDIS     TAKE these medications   acetaminophen 325 MG tablet Commonly known as: TYLENOL Take 2 tablets (650 mg total) by mouth every 6 (six) hours as needed for mild pain, moderate pain or fever (Temp > 100.5 F).   albuterol (2.5 MG/3ML) 0.083% nebulizer solution Commonly known as: PROVENTIL Take 3 mLs (2.5 mg total) by nebulization every 2 (two) hours as needed for wheezing.   amLODipine 2.5 MG tablet Commonly known as: NORVASC Take 1 tablet (2.5 mg total) by mouth daily.   CO Q 10 PO Take 1 tablet by mouth daily.   fish oil-omega-3 fatty acids 1000 MG capsule Take 1 g by mouth daily.   levETIRAcetam 500 MG tablet Commonly known as: KEPPRA Take 1 tablet (500 mg total) by mouth 2 (two) times daily.   levofloxacin 500 MG tablet Commonly known as: LEVAQUIN Take 1 tablet (500 mg total) by mouth daily.   metoprolol tartrate 25 MG tablet Commonly known as: LOPRESSOR Take 1 tablet (25 mg total) by mouth 2 (two) times daily.            Durable Medical Equipment  (From admission, onward)         Start     Ordered   07/16/19 1126  For home use only DME 3 n 1  Once     07/16/19 1126   07/16/19 1126  For home use only DME Walker rolling  Once    Question:  Patient needs a walker to treat with the following condition  Answer:  ICH (intracerebral hemorrhage) ([place])   07/16/19 1126         Allergies  Allergen Reactions  . Sulfonamide Derivatives Rash   Follow-up Information    [facility]. Schedule an appointment as soon as possible for a visit in 4 week(s).   Specialty: Neurology Contact information: [address] [place] Follow up.   Specialty: Rehabilitation Why:  The outpatient therapy will contact you for the first appointment Contact information: [place] [phone]           The results of significant diagnostics from this hospitalization (including imaging, microbiology, ancillary and laboratory) are listed below for reference.    Significant Diagnostic Studies: Ct Angio Head W Or Wo Contrast  Result Date: 07/14/2019 CLINICAL DATA:  Follow-up intracranial hemorrhage. EXAM: CT ANGIOGRAPHY HEAD AND NECK TECHNIQUE: Multidetector CT imaging of the head and neck was performed using the standard protocol during bolus administration of intravenous contrast. Multiplanar CT image reconstructions and MIPs were obtained to evaluate the vascular anatomy. Carotid stenosis measurements (when applicable) are obtained utilizing NASCET criteria, using the distal internal carotid diameter as the denominator. CONTRAST:  126mL OMNIPAQUE IOHEXOL 350 MG/ML SOLN COMPARISON:  None. FINDINGS: CTA NECK FINDINGS Aortic arch: Widely patent arch vessel origins. Common  origin of the left subclavian artery and left vertebral artery from the arch with an ectatic/fusiform dilated appearance of the common vessel measuring 2 cm in diameter and with a normal caliber of the vertebral and subclavian arteries more distally. No saccular aneurysm. Right carotid system: Patent and smooth without evidence of stenosis or dissection. Left carotid system: Patent and smooth without evidence of stenosis or dissection. Vertebral arteries: Patent and smooth without evidence of stenosis or dissection. Mildly dominant right vertebral artery. Skeleton: Moderate cervical disc and facet degeneration. Other neck: No evidence of cervical lymphadenopathy or mass. Endotracheal and enteric tubes in place with the former terminating well above the carina. Upper chest: Clear lung apices. Review of the MIP images confirms the above findings CTA HEAD  FINDINGS Anterior circulation: The internal carotid arteries are widely patent from skull base to carotid termini. ACAs and MCAs are patent without evidence of proximal branch occlusion or significant proximal stenosis. No aneurysm or vascular malformation is identified. Posterior circulation: The intracranial vertebral arteries are widely patent to the basilar. A patent left PICA and bilateral SCAs are visualized. AICAs and a right PICA are not clearly identified. The basilar artery is widely patent. There is a small left posterior communicating artery. Both PCAs are patent without evidence of significant stenosis. No aneurysm or vascular malformation is identified. Venous sinuses: Not well evaluated due to arterial phase contrast timing. Anatomic variants: None. Review of the MIP images confirms the above findings IMPRESSION: No large vessel occlusion, stenosis, or significant atherosclerosis in the head and neck. Electronically Signed   By: Logan Bores M.D.   On: 07/14/2019 12:14   Dg Chest 2 View  Result Date: 07/17/2019 CLINICAL DATA:  Fever and shortness of breath. EXAM: CHEST - 2 VIEW COMPARISON:  Chest x-ray 07/15/2019 FINDINGS: The endotracheal tube and NG tubes have been removed. The cardiac silhouette, mediastinal and hilar contours are within normal limits and stable. There are small bilateral pleural effusions and bibasilar atelectasis but no definite infiltrates, edema or worrisome pulmonary lesions. The bony thorax is intact. IMPRESSION: 1. Removal of the ET and NG tubes. 2. Small bilateral pleural effusions and bibasilar atelectasis. Electronically Signed   By: Marijo Sanes M.D.   On: 07/17/2019 12:32   Ct Head Wo Contrast  Result Date: 07/15/2019 CLINICAL DATA:  Followup known intracranial hemorrhage. EXAM: CT HEAD WITHOUT CONTRAST TECHNIQUE: Contiguous axial images were obtained from the base of the skull through the vertex without intravenous contrast. COMPARISON:  Head CT earlier this  day. FINDINGS: Brain: Posterior left parietal hemorrhage measures 2.7 x 2.4 x 1.5 cm (essentially unchanged from prior exam allowing for differences in caliper placement). Similar degree of surrounding edema. No associated midline shift or mass effect. Possible tiny hemorrhage in the dependent left lateral ventricle. No other new hemorrhage. No hydrocephalus. The basilar cisterns are patent. No subdural or extra-axial collection. Vascular: No hyperdense vessel or evidence of large vessel occlusion. Slight increased density of intracranial vasculature may be secondary to prior IV contrast administration. Skull: No fracture or focal lesion. Sinuses/Orbits: No acute findings. Bilateral cataract resection. Other: None. IMPRESSION: 1. Unchanged size of posterior left parietal hemorrhage with surrounding edema. 2. Possible tiny hemorrhage in the dependent left lateral ventricle. No other change from prior exam. Electronically Signed   By: Keith Rake M.D.   On: 07/15/2019 00:01   Ct Angio Neck W Or Wo Contrast  Result Date: 07/14/2019 CLINICAL DATA:  Follow-up intracranial hemorrhage. EXAM: CT ANGIOGRAPHY HEAD AND NECK TECHNIQUE:  Multidetector CT imaging of the head and neck was performed using the standard protocol during bolus administration of intravenous contrast. Multiplanar CT image reconstructions and MIPs were obtained to evaluate the vascular anatomy. Carotid stenosis measurements (when applicable) are obtained utilizing NASCET criteria, using the distal internal carotid diameter as the denominator. CONTRAST:  123mL OMNIPAQUE IOHEXOL 350 MG/ML SOLN COMPARISON:  None. FINDINGS: CTA NECK FINDINGS Aortic arch: Widely patent arch vessel origins. Common origin of the left subclavian artery and left vertebral artery from the arch with an ectatic/fusiform dilated appearance of the common vessel measuring 2 cm in diameter and with a normal caliber of the vertebral and subclavian arteries more distally. No  saccular aneurysm. Right carotid system: Patent and smooth without evidence of stenosis or dissection. Left carotid system: Patent and smooth without evidence of stenosis or dissection. Vertebral arteries: Patent and smooth without evidence of stenosis or dissection. Mildly dominant right vertebral artery. Skeleton: Moderate cervical disc and facet degeneration. Other neck: No evidence of cervical lymphadenopathy or mass. Endotracheal and enteric tubes in place with the former terminating well above the carina. Upper chest: Clear lung apices. Review of the MIP images confirms the above findings CTA HEAD FINDINGS Anterior circulation: The internal carotid arteries are widely patent from skull base to carotid termini. ACAs and MCAs are patent without evidence of proximal branch occlusion or significant proximal stenosis. No aneurysm or vascular malformation is identified. Posterior circulation: The intracranial vertebral arteries are widely patent to the basilar. A patent left PICA and bilateral SCAs are visualized. AICAs and a right PICA are not clearly identified. The basilar artery is widely patent. There is a small left posterior communicating artery. Both PCAs are patent without evidence of significant stenosis. No aneurysm or vascular malformation is identified. Venous sinuses: Not well evaluated due to arterial phase contrast timing. Anatomic variants: None. Review of the MIP images confirms the above findings IMPRESSION: No large vessel occlusion, stenosis, or significant atherosclerosis in the head and neck. Electronically Signed   By: Logan Bores M.D.   On: 07/14/2019 12:14   Mr [name] [id number] Contrast  Result Date: 07/15/2019 CLINICAL DATA:  Follow-up examination for intracranial hemorrhage. EXAM: MRI HEAD WITHOUT AND WITH CONTRAST TECHNIQUE: Multiplanar, multiecho pulse sequences of the brain and surrounding structures were obtained without and with intravenous contrast. CONTRAST:  77mL GADAVIST GADOBUTROL  1 MMOL/ML IV SOLN COMPARISON:  Prior CT from 07/14/2019. FINDINGS: Brain: Previously identified intra-axial hemorrhage centered at the left parietal convexity again seen, little interval changed in size and morphology measuring 2.2 x 1.9 x 1.8 cm. Surrounding low-density vasogenic edema with mild localized mass effect. Internal fluid-fluid level. No underlying mass lesion or abnormal enhancement identified. No other structural abnormality seen within this region by MRI. Mild scattered subarachnoid hemorrhage seen within the adjacent left parieto-occipital region. No intraventricular extension by MRI. Underlying cerebral volume within normal limits. Patchy T2/FLAIR hyperintensity within the periventricular and deep white matter both cerebral hemispheres noted, nonspecific, most likely related to chronic microvascular ischemic changes. Mild patchy involvement of the pons noted. Overall, appearance is mild to moderate in nature. No evidence for acute infarct elsewhere within the brain. No other encephalomalacia to suggest chronic cortical infarction. No other evidence for chronic intracranial hemorrhage. No significant dot burden to suggest cerebral amyloid angiopathy. No other mass lesion, mass effect, or midline shift. No hydrocephalus. No extra-axial fluid collection. Pituitary gland suprasellar region within normal limits. Midline structures intact and normal. No abnormal enhancement. Vascular: Major intracranial vascular flow  voids are maintained. Skull and upper cervical spine: Craniocervical junction within normal limits. Upper cervical spine normal. Bone marrow signal intensity within normal limits. No scalp soft tissue abnormality. Sinuses/Orbits: Patient status post bilateral ocular lens replacement. Paranasal sinuses are clear. No significant mastoid effusion. Inner ear structures grossly normal. Other: None. IMPRESSION: 1. Stable size and morphology of intra-axial hemorrhage centered at the left parietal  convexity. Mild localized edema without significant regional mass effect. No underlying mass lesion, abnormal enhancement, or other structural abnormality identified. 2. No other acute intracranial process. 3. Underlying mild to moderate chronic microvascular ischemic disease. Electronically Signed   By: Jeannine Boga M.D.   On: 07/15/2019 00:33   Dg Chest Port 1 View  Result Date: 07/15/2019 CLINICAL DATA:  Intubation. EXAM: PORTABLE CHEST 1 VIEW COMPARISON:  07/14/2019. FINDINGS: Endotracheal tube and NG tube in stable position. Heart size normal. Lung volumes bibasilar atelectasis. No focal infiltrate. No pleural effusion or pneumothorax. IMPRESSION: 1.  Lines and tubes stable position. 2.  Low lung volumes with mild bibasilar atelectasis. Electronically Signed   By: Marcello Moores  Register   On: 07/15/2019 07:29   Dg Chest Port 1 View  Result Date: 07/14/2019 CLINICAL DATA:  Advancement of ETT EXAM: PORTABLE CHEST 1 VIEW COMPARISON:  07/14/2019, 11:25 a.m. FINDINGS: No significant change in position of endotracheal tube, which is over the mid trachea and approximately 6 cm above the carina. Esophagogastric tube is positioned with tip and side port below the diaphragm. No acute abnormality of the lungs. The heart and mediastinum are unremarkable. IMPRESSION: 1. No significant change in position of endotracheal tube, which is over the mid trachea and approximately 6 cm above the carina. Esophagogastric tube is positioned with tip and side port below the diaphragm. 2.  No acute abnormality of the lungs. Electronically Signed   By: Eddie Candle M.D.   On: 07/14/2019 16:13   Dg Chest Portable 1 View  Result Date: 07/14/2019 CLINICAL DATA:  Pt here from home with c/o aloc , pt had a seizure at the check in desk rushed back to trauma B ,Post intubation EXAM: PORTABLE CHEST 1 VIEW COMPARISON:  None. FINDINGS: Endotracheal tube tip projects 5.6 cm above the carina. Nasal/orogastric tube passes below the  diaphragm within the stomach. Cardiac silhouette is normal in size. No mediastinal or hilar masses. Clear lungs.  No pleural effusion pneumothorax. Skeletal structures are grossly intact. IMPRESSION: 1. Endotracheal tube tip 5.6 cm above the carina. 2. Well-positioned nasogastric tube. 3. No acute cardiopulmonary disease. Electronically Signed   By: Lajean Manes M.D.   On: 07/14/2019 11:50   Dg Abd Portable 1v  Result Date: 07/14/2019 CLINICAL DATA:  Pre MRI. EXAM: PORTABLE ABDOMEN - 1 VIEW COMPARISON:  CT 07/24/2012 FINDINGS: Right aspect of the abdomen not entirely included in the field of view. Enteric tube in place with tip and side-port below the diaphragm in the stomach. No visualized radiopaque foreign bodies in the abdomen. Excreted IV contrast in the urinary bladder from prior head and neck CTA. IMPRESSION: No radiopaque foreign bodies in the abdomen to preclude MRI imaging. Enteric tube in place with tip and side-port below the diaphragm in the stomach. Electronically Signed   By: Keith Rake M.D.   On: 07/14/2019 22:15   Ct Head Code Stroke Wo Contrast  Result Date: 07/14/2019 CLINICAL DATA:  Code stroke. Altered level of consciousness. Seizure. EXAM: CT HEAD WITHOUT CONTRAST TECHNIQUE: Contiguous axial images were obtained from the base of the skull through the  vertex without intravenous contrast. COMPARISON:  None. FINDINGS: Brain: An acute parenchymal hemorrhage in the posterior left parietal lobe measures 2.4 x 2.6 x 1.5 cm (approximately 5 cc) with mild surrounding edema. There is no midline shift or other significant mass effect. No acute infarct is identified separate from the hemorrhage. There is no extra-axial fluid collection. The ventricles are normal in size. Vascular: No hyperdense vessel. Skull: No fracture or focal osseous lesion. Sinuses/Orbits: Paranasal sinuses and mastoid air cells are clear. Bilateral cataract extraction. Other: None. ASPECTS [facility] Stroke Program Early  CT Score) Not scored due to the presence of acute hemorrhage. IMPRESSION: 2.6 cm acute left parietal parenchymal hemorrhage. Mild edema without mass effect. Critical Value/emergent results were called by telephone at the time of interpretation on 07/14/2019 at 11:56 a.m. to Dr. Madalyn Rob, who verbally acknowledged these results. Electronically Signed   By: Logan Bores M.D.   On: 07/14/2019 12:01    Microbiology: Recent Results (from the past 240 hour(s))  SARS Coronavirus 2 [facility] order, Performed in [facility] hospital lab) Nasopharyngeal Nasopharyngeal Swab     Status: None   Collection Time: 07/14/19 11:12 AM   Specimen: Nasopharyngeal Swab  Result Value Ref Range Status   SARS Coronavirus 2 NEGATIVE NEGATIVE Final    Comment: (NOTE) If result is NEGATIVE SARS-CoV-2 target nucleic acids are NOT DETECTED. The SARS-CoV-2 RNA is generally detectable in upper and lower  respiratory specimens during the acute phase of infection. The lowest  concentration of SARS-CoV-2 viral copies this assay can detect is 250  copies / mL. A negative result does not preclude SARS-CoV-2 infection  and should not be used as the sole basis for treatment or other  patient management decisions.  A negative result may occur with  improper specimen collection / handling, submission of specimen other  than nasopharyngeal swab, presence of viral mutation(s) within the  areas targeted by this assay, and inadequate number of viral copies  (<250 copies / mL). A negative result must be combined with clinical  observations, patient history, and epidemiological information. If result is POSITIVE SARS-CoV-2 target nucleic acids are DETECTED. The SARS-CoV-2 RNA is generally detectable in upper and lower  respiratory specimens dur ing the acute phase of infection.  Positive  results are indicative of active infection with SARS-CoV-2.  Clinical  correlation with patient history and other diagnostic information is   necessary to determine patient infection status.  Positive results do  not rule out bacterial infection or co-infection with other viruses. If result is PRESUMPTIVE POSTIVE SARS-CoV-2 nucleic acids MAY BE PRESENT.   A presumptive positive result was obtained on the submitted specimen  and confirmed on repeat testing.  While 2019 novel coronavirus  (SARS-CoV-2) nucleic acids may be present in the submitted sample  additional confirmatory testing may be necessary for epidemiological  and / or clinical management purposes  to differentiate between  SARS-CoV-2 and other Sarbecovirus currently known to infect humans.  If clinically indicated additional testing with an alternate test  methodology [phone]) is advised. The SARS-CoV-2 RNA is generally  detectable in upper and lower respiratory sp ecimens during the acute  phase of infection. The expected result is Negative. Fact Sheet for Patients:  StrictlyIdeas.no Fact Sheet for Healthcare Providers: BankingDealers.co.za This test is not yet approved or cleared by the [place] FDA and has been authorized for detection and/or diagnosis of SARS-CoV-2 by FDA under an Emergency Use Authorization (EUA).  This EUA will remain in effect (meaning this test  can be used) for the duration of the COVID-19 declaration under Section 564(b)(1) of the Act, 21 U.S.C. section 360bbb-3(b)(1), unless the authorization is terminated or revoked sooner. Performed at [facility], [place] [address] [place], [place] [address]   MRSA PCR Screening     Status: None   Collection Time: 07/14/19  3:06 PM   Specimen: Nasal Mucosa; Nasopharyngeal  Result Value Ref Range Status   MRSA by PCR NEGATIVE NEGATIVE Final    Comment:        The GeneXpert MRSA Assay (FDA approved for NASAL specimens only), is one component of a comprehensive MRSA colonization surveillance program. It is not intended to diagnose  MRSA infection nor to guide or monitor treatment for MRSA infections. Performed at [facility], [place] [address] [place], [place] [address]   Culture, Urine     Status: Abnormal (Preliminary result)   Collection Time: 07/17/19  3:46 PM   Specimen: Urine, Random  Result Value Ref Range Status   Specimen Description URINE, RANDOM  Final   Special Requests NONE  Final   Culture (A)  Final    >=100,000 COLONIES/mL ESCHERICHIA COLI SUSCEPTIBILITIES TO FOLLOW Performed at [facility], [place] [address], [place],  [address]    Report Status PENDING  Incomplete     Labs: Basic Metabolic Panel: Recent Labs  Lab 07/14/19 1104 07/14/19 1115 07/14/19 1155 07/14/19 1316 07/14/19 1618 07/14/19 1628 07/15/19 0312 07/16/19 0924  NA 147* 147*  --  138 137 139 140 137  K 3.7 3.6  --  3.7 3.5 3.3* 3.7 4.1  CL 111 114*  --   --  110  --   --  106  CO2 14*  --   --   --  19*  --   --  22  GLUCOSE 114* 105*  --   --  89  --   --  119*  BUN 16 19  --   --  14  --   --  7*  CREATININE 1.22 1.00  --   --  1.04  --   --  1.17  CALCIUM 9.9  --   --   --  8.4*  --   --  8.4*  MG  --   --  1.8  --   --   --   --  1.9  PHOS  --   --  1.7*  --   --   --   --   --    Liver Function Tests: Recent Labs  Lab 07/14/19 1104  AST 24  ALT 15  ALKPHOS 101  BILITOT 0.8  PROT 7.3  ALBUMIN 4.3   No results for input(s): LIPASE, AMYLASE in the last 168 hours. No results for input(s): AMMONIA in the last 168 hours. CBC: Recent Labs  Lab 07/14/19 1104  07/15/19 0312 07/15/19 0628 07/16/19 0924 07/18/19 0832 07/19/19 0323  WBC 9.2  --   --  9.8 11.7* 11.9* 8.2  NEUTROABS 4.6  --   --   --   --  9.6* 6.2  HGB 16.0   < > 11.9* 13.0 12.1* 11.6* 11.2*  HCT 49.8   < > 35.0* 39.7 34.8* 33.5* 33.9*  MCV 96.7  --   --  91.9 89.7 89.8 90.4  PLT 252  --   --  170 157 153 198   < > = values in this interval not displayed.   Cardiac Enzymes:  No results for input(s): CKTOTAL, CKMB,  CKMBINDEX, TROPONINI in the last 168 hours. BNP: BNP (last 3 results) No results for input(s): BNP in the last 8760 hours.  ProBNP (last 3 results) No results for input(s): PROBNP in the last 8760 hours.  CBG: Recent Labs  Lab 07/18/19 1528 07/18/19 2027 07/18/19 2306 07/19/19 0309 07/19/19 0750  GLUCAP 90 98 96 87 87       Signed:  Nita Sells MD   [facility] 07/19/2019, 8:33 AM

## 2019-07-19 NOTE — Plan of Care (Signed)
Problem: Education: Goal: Knowledge of General Education information will improve Description: Including pain rating scale, medication(s)/side effects and non-pharmacologic comfort measures 07/19/2019 1249 by Myriam Forehand, RN Outcome: Adequate for Discharge 07/19/2019 1249 by Myriam Forehand, RN Outcome: Adequate for Discharge   Problem: Health Behavior/Discharge Planning: Goal: Ability to manage health-related needs will improve 07/19/2019 1249 by Myriam Forehand, RN Outcome: Adequate for Discharge 07/19/2019 1249 by Myriam Forehand, RN Outcome: Adequate for Discharge   Problem: Clinical Measurements: Goal: Ability to maintain clinical measurements within normal limits will improve 07/19/2019 1249 by Myriam Forehand, RN Outcome: Adequate for Discharge 07/19/2019 1249 by Myriam Forehand, RN Outcome: Adequate for Discharge Goal: Will remain free from infection 07/19/2019 1249 by Myriam Forehand, RN Outcome: Adequate for Discharge 07/19/2019 1249 by Myriam Forehand, RN Outcome: Adequate for Discharge Goal: Diagnostic test results will improve 07/19/2019 1249 by Myriam Forehand, RN Outcome: Adequate for Discharge 07/19/2019 1249 by Myriam Forehand, RN Outcome: Adequate for Discharge Goal: Respiratory complications will improve 07/19/2019 1249 by Myriam Forehand, RN Outcome: Adequate for Discharge 07/19/2019 1249 by Myriam Forehand, RN Outcome: Adequate for Discharge Goal: Cardiovascular complication will be avoided 07/19/2019 1249 by Myriam Forehand, RN Outcome: Adequate for Discharge 07/19/2019 1249 by Myriam Forehand, RN Outcome: Adequate for Discharge   Problem: Activity: Goal: Risk for activity intolerance will decrease 07/19/2019 1249 by Myriam Forehand, RN Outcome: Adequate for Discharge 07/19/2019 1249 by Myriam Forehand, RN Outcome: Adequate for Discharge   Problem: Nutrition: Goal: Adequate nutrition will be maintained 07/19/2019 1249 by Myriam Forehand, RN Outcome: Adequate for Discharge 07/19/2019 1249  by Myriam Forehand, RN Outcome: Adequate for Discharge   Problem: Coping: Goal: Level of anxiety will decrease 07/19/2019 1249 by Myriam Forehand, RN Outcome: Adequate for Discharge 07/19/2019 1249 by Myriam Forehand, RN Outcome: Adequate for Discharge   Problem: Elimination: Goal: Will not experience complications related to bowel motility 07/19/2019 1249 by Myriam Forehand, RN Outcome: Adequate for Discharge 07/19/2019 1249 by Myriam Forehand, RN Outcome: Adequate for Discharge Goal: Will not experience complications related to urinary retention 07/19/2019 1249 by Myriam Forehand, RN Outcome: Adequate for Discharge 07/19/2019 1249 by Myriam Forehand, RN Outcome: Adequate for Discharge   Problem: Pain Managment: Goal: General experience of comfort will improve 07/19/2019 1249 by Myriam Forehand, RN Outcome: Adequate for Discharge 07/19/2019 1249 by Myriam Forehand, RN Outcome: Adequate for Discharge   Problem: Safety: Goal: Ability to remain free from injury will improve 07/19/2019 1249 by Myriam Forehand, RN Outcome: Adequate for Discharge 07/19/2019 1249 by Myriam Forehand, RN Outcome: Adequate for Discharge   Problem: Skin Integrity: Goal: Risk for impaired skin integrity will decrease 07/19/2019 1249 by Myriam Forehand, RN Outcome: Adequate for Discharge 07/19/2019 1249 by Myriam Forehand, RN Outcome: Adequate for Discharge   Problem: Education: Goal: Knowledge of disease or condition will improve 07/19/2019 1249 by Myriam Forehand, RN Outcome: Adequate for Discharge 07/19/2019 1249 by Myriam Forehand, RN Outcome: Adequate for Discharge Goal: Knowledge of secondary prevention will improve 07/19/2019 1249 by Myriam Forehand, RN Outcome: Adequate for Discharge 07/19/2019 1249 by Myriam Forehand, RN Outcome: Adequate for Discharge Goal: Knowledge of patient specific risk factors addressed and post discharge goals established will improve 07/19/2019 1249 by Myriam Forehand, RN Outcome: Adequate for  Discharge 07/19/2019 1249 by Myriam Forehand, RN Outcome: Adequate for Discharge Goal: Individualized Educational Video(s) 07/19/2019 1249 by Myriam Forehand, RN Outcome: Adequate for Discharge 07/19/2019 1249 by Myriam Forehand, RN Outcome: Adequate for Discharge   Problem: Coping: Goal: Will verbalize positive feelings about self 07/19/2019 1249 by Elba Barman,  Peng-Sian, RN Outcome: Adequate for Discharge 07/19/2019 1249 by Myriam Forehand, RN Outcome: Adequate for Discharge Goal: Will identify appropriate support needs 07/19/2019 1249 by Myriam Forehand, RN Outcome: Adequate for Discharge 07/19/2019 1249 by Myriam Forehand, RN Outcome: Adequate for Discharge   Problem: Health Behavior/Discharge Planning: Goal: Ability to manage health-related needs will improve 07/19/2019 1249 by Myriam Forehand, RN Outcome: Adequate for Discharge 07/19/2019 1249 by Myriam Forehand, RN Outcome: Adequate for Discharge   Problem: Self-Care: Goal: Ability to participate in self-care as condition permits will improve 07/19/2019 1249 by Myriam Forehand, RN Outcome: Adequate for Discharge 07/19/2019 1249 by Myriam Forehand, RN Outcome: Adequate for Discharge Goal: Verbalization of feelings and concerns over difficulty with self-care will improve 07/19/2019 1249 by Myriam Forehand, RN Outcome: Adequate for Discharge 07/19/2019 1249 by Myriam Forehand, RN Outcome: Adequate for Discharge Goal: Ability to communicate needs accurately will improve 07/19/2019 1249 by Myriam Forehand, RN Outcome: Adequate for Discharge 07/19/2019 1249 by Myriam Forehand, RN Outcome: Adequate for Discharge   Problem: Nutrition: Goal: Risk of aspiration will decrease 07/19/2019 1249 by Myriam Forehand, RN Outcome: Adequate for Discharge 07/19/2019 1249 by Myriam Forehand, RN Outcome: Adequate for Discharge Goal: Dietary intake will improve 07/19/2019 1249 by Myriam Forehand, RN Outcome: Adequate for Discharge 07/19/2019 1249 by Myriam Forehand, RN Outcome: Adequate for  Discharge   Problem: Intracerebral Hemorrhage Tissue Perfusion: Goal: Complications of Intracerebral Hemorrhage will be minimized 07/19/2019 1249 by Myriam Forehand, RN Outcome: Adequate for Discharge 07/19/2019 1249 by Myriam Forehand, RN Outcome: Adequate for Discharge

## 2019-07-19 NOTE — Plan of Care (Signed)
  Problem: Education: °Goal: Knowledge of General Education information will improve °Description: Including pain rating scale, medication(s)/side effects and non-pharmacologic comfort measures °Outcome: Adequate for Discharge °  °Problem: Health Behavior/Discharge Planning: °Goal: Ability to manage health-related needs will improve °Outcome: Adequate for Discharge °  °Problem: Clinical Measurements: °Goal: Ability to maintain clinical measurements within normal limits will improve °Outcome: Adequate for Discharge °Goal: Will remain free from infection °Outcome: Adequate for Discharge °Goal: Diagnostic test results will improve °Outcome: Adequate for Discharge °Goal: Respiratory complications will improve °Outcome: Adequate for Discharge °Goal: Cardiovascular complication will be avoided °Outcome: Adequate for Discharge °  °Problem: Activity: °Goal: Risk for activity intolerance will decrease °Outcome: Adequate for Discharge °  °Problem: Nutrition: °Goal: Adequate nutrition will be maintained °Outcome: Adequate for Discharge °  °Problem: Coping: °Goal: Level of anxiety will decrease °Outcome: Adequate for Discharge °  °Problem: Elimination: °Goal: Will not experience complications related to bowel motility °Outcome: Adequate for Discharge °Goal: Will not experience complications related to urinary retention °Outcome: Adequate for Discharge °  °Problem: Pain Managment: °Goal: General experience of comfort will improve °Outcome: Adequate for Discharge °  °Problem: Safety: °Goal: Ability to remain free from injury will improve °Outcome: Adequate for Discharge °  °Problem: Skin Integrity: °Goal: Risk for impaired skin integrity will decrease °Outcome: Adequate for Discharge °  °Problem: Education: °Goal: Knowledge of disease or condition will improve °Outcome: Adequate for Discharge °Goal: Knowledge of secondary prevention will improve °Outcome: Adequate for Discharge °Goal: Knowledge of patient specific risk factors  addressed and post discharge goals established will improve °Outcome: Adequate for Discharge °Goal: Individualized Educational Video(s) °Outcome: Adequate for Discharge °  °Problem: Coping: °Goal: Will verbalize positive feelings about self °Outcome: Adequate for Discharge °Goal: Will identify appropriate support needs °Outcome: Adequate for Discharge °  °Problem: Health Behavior/Discharge Planning: °Goal: Ability to manage health-related needs will improve °Outcome: Adequate for Discharge °  °Problem: Self-Care: °Goal: Ability to participate in self-care as condition permits will improve °Outcome: Adequate for Discharge °Goal: Verbalization of feelings and concerns over difficulty with self-care will improve °Outcome: Adequate for Discharge °Goal: Ability to communicate needs accurately will improve °Outcome: Adequate for Discharge °  °Problem: Nutrition: °Goal: Risk of aspiration will decrease °Outcome: Adequate for Discharge °Goal: Dietary intake will improve °Outcome: Adequate for Discharge °  °Problem: Intracerebral Hemorrhage Tissue Perfusion: °Goal: Complications of Intracerebral Hemorrhage will be minimized °Outcome: Adequate for Discharge °  °

## 2019-07-19 NOTE — TOC Transition Note (Signed)
Transition of Care [facility]) - CM/SW Discharge Note   Patient Details  Name: Thomas Norman MRN: [id number] Date of Birth: 1951/08/29  Transition of Care [facility]) CM/SW Contact:  Pollie Friar, RN Phone Number: 07/19/2019, 8:57 AM   Clinical Narrative:    Pt discharging home and will have outpatient therapy. Information on the AVS for outpatient as discussed a few days ago with patient and wife.  DME is at the bedside for home.  Wife to provide transport home.   Final next level of care: OP Rehab Barriers to Discharge: No Barriers Identified   Patient Goals and CMS Choice   CMS Medicare.gov Compare Post Acute Care list provided to:: Patient Choice offered to / list presented to : Patient, Spouse  Discharge Placement                       Discharge Plan and Services   Discharge Planning Services: CM Consult            DME Arranged: 3-N-1, Walker rolling DME Agency: [employer]                  Social Determinants of Health (SDOH) Interventions     Readmission Risk Interventions No flowsheet data found.

## 2019-07-22 ENCOUNTER — Ambulatory Visit: Payer: Medicare Other | Attending: Neurology | Admitting: Physical Therapy

## 2019-07-22 ENCOUNTER — Other Ambulatory Visit: Payer: Self-pay

## 2019-07-22 DIAGNOSIS — R2689 Other abnormalities of gait and mobility: Secondary | ICD-10-CM | POA: Insufficient documentation

## 2019-07-22 DIAGNOSIS — R2681 Unsteadiness on feet: Secondary | ICD-10-CM | POA: Insufficient documentation

## 2019-07-22 DIAGNOSIS — R42 Dizziness and giddiness: Secondary | ICD-10-CM | POA: Insufficient documentation

## 2019-07-22 DIAGNOSIS — R41841 Cognitive communication deficit: Secondary | ICD-10-CM | POA: Insufficient documentation

## 2019-07-22 NOTE — Therapy (Signed)
[facility] [address] [place], [place], [address] Phone: [phone]   Fax:  [phone]  Physical Therapy Evaluation  Patient Details  Name: Thomas Norman MRN: [id number] Date of Birth: November 08, 1950 Referring Provider (PT): Dia Crawford, MD Hospitalist, Will be following up with Frann Rider at [facility]   Encounter Date: 07/22/2019  PT End of Session - 07/22/19 1417    Visit Number  1    Number of Visits  6    Date for PT Re-Evaluation  09/20/19    Authorization Type  Medicare    PT Start Time  1325    PT Stop Time  1410    PT Time Calculation (min)  45 min    Equipment Utilized During Treatment  Gait belt    Activity Tolerance  Patient tolerated treatment well    Behavior During Therapy  [facility] for tasks assessed/performed       Past Medical History:  Diagnosis Date  . Hypertension   . Kidney calculi     Past Surgical History:  Procedure Laterality Date  . LITHOTRIPSY    . skin cancers    . TONSILLECTOMY      There were no vitals filed for this visit.   Subjective Assessment - 07/22/19 1327    Subjective  Hospitalized 9/20-9/25/20. He had seizure like activity in ED and was intubated,  CT/MRI showed small L parietal hemorrhage.  Pt states he recovered pretty well - physically and mentally. Walking and balance feels almost 100% - has 5% dizziness. Has been feeling dizzy sometimes when he is walking, but it has been getting better everyday. Difficulty performing SLS on R.    Patient is accompained by:  Family member   [name], wife   Pertinent History  HTN, HLD    Diagnostic tests  CT/MRI showed small L parietal hemorrhage.    Patient Stated Goals  wants to work as hard as he can, wants to improve his balance and be better than he has been before    Currently in Pain?  No/denies         [facility] PT Assessment - 07/22/19 1335      Assessment   Medical Diagnosis  L parietal hemorrhagic CVA    Referring  Provider (PT)  Dia Crawford, MD Hospitalist, Will be following up with Frann Rider at [facility]    Onset Date/Surgical Date  07/14/19    Hand Dominance  Right    Prior Therapy  N/A      Balance Screen   Has the patient fallen in the past 6 months  Yes   pulling a trailer, slipped back and fell on his head   How many times?  1    Has the patient had a decrease in activity level because of a fear of falling?   No    Is the patient reluctant to leave their home because of a fear of falling?   No      Home Environment   Living Environment  Private residence    Living Arrangements  Spouse/significant other    Type of [place] to enter    Entrance Stairs-Number of Steps  2   in garage   [place]  Two level   lives on the ground floor   Home Equipment  Shower seat;Walker - standard      Prior Function   Level  of [place]  Retired    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, sports     Leisure  being outside, working in the yard, going to the [employer]   Overall Cognitive Status  Within Functional Limits for tasks assessed      Sensation   Light Touch  Appears Intact    Hot/Cold  Appears Intact      Coordination   Gross Motor Movements are Fluid and Coordinated  Yes      ROM / Strength   AROM / PROM / Strength  Strength      Strength   Overall Strength  Within functional limits for tasks performed    Overall Strength Comments  Grossly 5/5 for BLE, increased difficulty performing calf raise on LLE without support       Ambulation/Gait   Ambulation/Gait  Yes    Ambulation/Gait Assistance  6: Modified independent (Device/Increase time)    Ambulation Distance (Feet)  115 Feet    Assistive device  None    Gait Pattern  Step-through pattern;Narrow base of support;Trunk flexed    Ambulation Surface  Level;Indoor    Gait velocity  10.5  seconds = 3.12 ft/sec    Stairs  Yes    Stairs Assistance  5: Supervision    Stair Management Technique  No rails;Alternating pattern;Forwards    Gait Comments  Patient reported feeling mild dizziness when performing a quick turn during gait, subsided within seconds.      High Level Balance   High Level Balance Comments  SLS on L leg: 8 seconds, R leg: 18 seconds. mCTSIB: 30 seconds on conditions 3 and 4      Functional Gait  Assessment   Gait assessed   Yes    Gait Level Surface  Walks 20 ft in less than 7 sec but greater than 5.5 sec, uses assistive device, slower speed, mild gait deviations, or deviates 6-10 in outside of the 12 in walkway width.   5.87 seconds   Change in Gait Speed  Able to smoothly change walking speed without loss of balance or gait deviation. Deviate no more than 6 in outside of the 12 in walkway width.    Gait with Horizontal Head Turns  Performs head turns smoothly with slight change in gait velocity (eg, minor disruption to smooth gait path), deviates 6-10 in outside 12 in walkway width, or uses an assistive device.    Gait with Vertical Head Turns  Performs task with slight change in gait velocity (eg, minor disruption to smooth gait path), deviates 6 - 10 in outside 12 in walkway width or uses assistive device    Gait and Pivot Turn  Pivot turns safely within 3 sec and stops quickly with no loss of balance.    Step Over Obstacle  Is able to step over one shoe box (4.5 in total height) without changing gait speed. No evidence of imbalance.    Gait with Narrow Base of Support  Ambulates less than 4 steps heel to toe or cannot perform without assistance.    Gait with Eyes Closed  Walks 20 ft, uses assistive device, slower speed, mild gait deviations, deviates 6-10 in outside 12 in walkway width. Ambulates 20 ft in less than 9 sec but greater than 7 sec.   8.87 seconds   Ambulating Backwards  Walks 20 ft, uses assistive  device, slower speed, mild gait deviations,  deviates 6-10 in outside 12 in walkway width.   11.76 seconds   Steps  Alternating feet, no rail.    Total Score  21    FGA comment:  21/30                Objective measurements completed on examination: See above findings.                PT Short Term Goals - 07/22/19 1422      PT SHORT TERM GOAL #1   Title  ALL STGS = LTGS        PT Long Term Goals - 07/22/19 2110      PT LONG TERM GOAL #1   Title  Patient will be independent with final HEP in order to build upon functional gains made in therapy. ALL LTGS DUE 09/02/19    Time  6    Period  Weeks    Target Date  09/02/19      PT LONG TERM GOAL #2   Title  Patient will improve his FGA score to at least a 28/30 in order to demonstrate decreased fall risk.    Baseline  21/30 on 07/22/19    Time  6    Period  Weeks    Status  New      PT LONG TERM GOAL #3   Title  Patient will ambulate at least 500' outdoors on unlevel surfaces while scanning environment with mod I in order to improve community mobility and get back to working in the yard.    Time  6    Period  Weeks    Status  New      PT LONG TERM GOAL #4   Title  Pt and wife will verbalize understanding of the signs and symptoms of a stroke as well as risk factors.    Time  6    Period  Weeks    Status  New      PT LONG TERM GOAL #5   Title  Pt will report 0/10 dizziness after performing turns during gait.    Time  6    Period  Weeks    Status  New      Additional Long Term Goals   Additional Long Term Goals  Yes      PT LONG TERM GOAL #6   Title  Patient will improve gait speed to at least 3.8 ft/sec in order demonstrate improve mobility.    Baseline  3.12 ft/sec on 07/22/19    Time  6    Period  Weeks    Status  New             Plan - 07/22/19 1417    Clinical Impression Statement  Patient is a 68 year old male referred to Neuro OPPT for evaluation s/p small L parietal hemorrhage.  Pt's PMH is significant for: HTN, HLD. The  following deficits were present during the exam: impaired balance during dynamic gait, dizziness while performing turns.  Pt's FGA scores indicate pt is at a moderate risk for falls. Pt would benefit from skilled PT to address these impairments and functional limitations to maximize functional mobility independence and gait/balance for community mobility.    Personal Factors and Comorbidities  Comorbidity 2;Past/Current Experience    Comorbidities  HTN, HLD    Examination-Activity Limitations  Locomotion Level;Bathing   bathing - performing SLS in shower   Examination-Participation Restrictions  Yard Work    Stability/Clinical Decision Making  Stable/Uncomplicated    Designer, jewellery  Low    Rehab Potential  Excellent    PT Frequency  1x / week    PT Duration  --   5 weeks   PT Treatment/Interventions  ADLs/Self Care Home Management;Gait training;Functional mobility training;Therapeutic activities;Neuromuscular re-education;Balance training;Therapeutic exercise;Patient/family education;Vestibular    PT Next Visit Plan  initial HEP for balance, vestibular assessment?, assess gait outdoors.    Consulted and Agree with Plan of Care  Family member/caregiver    Family Member Consulted  pt's wife [name]       Patient will benefit from skilled therapeutic intervention in order to improve the following deficits and impairments:  Abnormal gait, Decreased balance, Dizziness, Difficulty walking  Visit Diagnosis: Unsteadiness on feet  Other abnormalities of gait and mobility  Dizziness and giddiness     Problem List Patient Active Problem List   Diagnosis Date Noted  . ICH (intracerebral hemorrhage) ([place]) 07/14/2019  . Hemorrhagic stroke ([place]) 07/14/2019  . Acute respiratory failure ([place])   . HYPERLIPIDEMIA 06/11/2007  . HYPERTENSION 06/11/2007  . ALLERGIC RHINITIS 06/11/2007    Arliss Journey, PT, DPT 07/22/2019, 9:20 PM  [place]  [address] [place], [place], [address] Phone: [phone]   Fax:  [phone]  Name: Thomas Norman MRN: [id number] Date of Birth: Apr 14, 1951

## 2019-07-23 ENCOUNTER — Ambulatory Visit: Payer: Medicare Other

## 2019-07-23 DIAGNOSIS — R2689 Other abnormalities of gait and mobility: Secondary | ICD-10-CM | POA: Diagnosis not present

## 2019-07-23 DIAGNOSIS — R41841 Cognitive communication deficit: Secondary | ICD-10-CM

## 2019-07-23 DIAGNOSIS — R2681 Unsteadiness on feet: Secondary | ICD-10-CM | POA: Diagnosis not present

## 2019-07-23 DIAGNOSIS — R42 Dizziness and giddiness: Secondary | ICD-10-CM | POA: Diagnosis not present

## 2019-07-23 NOTE — Therapy (Signed)
[facility] [address] [place], [place], [address] Phone: [phone]   Fax:  [phone]  Speech Language Pathology Evaluation  Patient Details  Name: Thomas Norman MRN: [id number] Date of Birth: 11-Sep-1951 Referring Provider (SLP): Dia Crawford (3W); notes sent to PCP- Gaynelle Arabian, MD   Encounter Date: 07/23/2019  End of Session - 07/23/19 1516    Visit Number  1    Number of Visits  17    Date for SLP Re-Evaluation  10/21/19    SLP Start Time  0850    SLP Stop Time   0932    SLP Time Calculation (min)  42 min    Activity Tolerance  Patient tolerated treatment well       Past Medical History:  Diagnosis Date  . Hypertension   . Kidney calculi     Past Surgical History:  Procedure Laterality Date  . LITHOTRIPSY    . skin cancers    . TONSILLECTOMY      There were no vitals filed for this visit.  Subjective Assessment - 07/23/19 0854    Subjective  "I think I've bounced back. But I'm sort of a lazy thinker anyways - I remember what I want to remember."    Currently in Pain?  No/denies         SLP Evaluation [facility] - 07/23/19 [id number]      SLP Visit Information   SLP Received On  07/23/19    Referring Provider (SLP)  Dia Crawford (3W); notes sent to PCP- Gaynelle Arabian, MD    Onset Date  07-14-19    Medical Diagnosis  lt parietal CVA      Subjective   Subjective  Pt states no memory deficit - wife states pt has short term memory deficits post-CVA.     Patient/Family Stated Goal  improve skills to be able to be at premorbid levels      General Information   HPI  PMH of HTN, HLD who presented with aphasia 07-14-19. TPA was not administered. MRI of the brain revealed intra-axial hemorrhage centered at the left parietal convexity. He had seizure like activity in ED and was intubated with extubation on 07/15/19      Prior Functional Status   Cognitive/Linguistic Baseline  Within functional limits     Type of Home  House     Lives With  Spouse    Available Support  Family    Education  BS accounting    Vocation  --   Hauling/trucking; Engineer, maintenance (IT) for 2 local businesses; detail cars     Cognition   Overall Cognitive Status  Impaired/Different from baseline    Area of Impairment  Memory;Awareness;Attention    Attention Comments  attention to detail - symbol cross out- pt showed SLP incorrect symbol to match target symbol    Memory Comments  wife states STM lost    Awareness Comments  pt did not acknowledge deficit areas other than "processing or thinking". Pt overly verbose x4 (once, wife encouraged pt three times -approx 30-45 seconds apart- to cease an explanation). "I'm sort of a lazy thinker to begin with."    Behaviors  --   overly verbose vs. personality     Oral Motor/Sensory Function   Overall Oral Motor/Sensory Function  Appears within functional limits for tasks assessed      Motor Speech   Overall Motor Speech  Appears within functional limits for tasks assessed  SLP Education - 07/23/19 1515    Education Details  results/explanation of findings so far with CLQT    Person(s) Educated  Patient;Spouse    Methods  Explanation    Comprehension  Verbalized understanding       SLP Short Term Goals - 07/23/19 1526      SLP SHORT TERM GOAL #1   Title  pt will complete cognitive testing in first 1-2 sessions    Time  2    Period  --   sessions   Status  New      SLP SHORT TERM GOAL #2   Title  pt will name 3 non-physical deficit areas    Time  4    Period  Weeks    Status  New      SLP SHORT TERM GOAL #3   Title  pt will demo emergent awareness of errors on therapy tasks with rare min A in 3 sessions    Time  4    Period  Weeks    Status  New      SLP SHORT TERM GOAL #4   Title  pt will demo alternating attention in two simple cognitive-linguistic therapy tasks over three sessions    Time  4    Period  Weeks    Status  New        SLP Long Term Goals - 07/23/19 1529      SLP LONG TERM GOAL #1   Title  pt will demo anticipatory awareness by double checking 85% of work in therapy session over 3 sessions    Time  8    Period  Weeks   or 17 sessions, for all LTGs   Status  New      SLP LONG TERM GOAL #2   Title  pt will demo divided attention with a mod complex task and a simple linguistic task over three sessions    Time  [place] - 07/23/19 1517    Clinical Impression Statement  Pt presents today with at least mild cognitive communication deficit characterized by reduced memory/attention, attention to detail, and awareness. SLP ? pt personality vs. oververbosity/attention and awareness deficit (during pt's explanation of occupation wife encourgaed pt to stop explanation x3).SLP will complete full cognitive linguistic testing next session and add goals as appropriate. Pt would benefit from skilled ST to target these higher-level areas of cognitive communicaiton.    Speech Therapy Frequency  2x / week    Duration  --   8 weeks or 17 sessions   Treatment/Interventions  SLP instruction and feedback;Compensatory strategies;Patient/family education;Internal/external aids;Cognitive reorganization;Functional tasks    Potential to Achieve Goals  Good    Consulted and Agree with Plan of Care  Patient       Patient will benefit from skilled therapeutic intervention in order to improve the following deficits and impairments:   Cognitive communication deficit    Problem List Patient Active Problem List   Diagnosis Date Noted  . ICH (intracerebral hemorrhage) ([place]) 07/14/2019  . Hemorrhagic stroke ([place]) 07/14/2019  . Acute respiratory failure ([place])   . HYPERLIPIDEMIA 06/11/2007  . HYPERTENSION 06/11/2007  . ALLERGIC RHINITIS 06/11/2007    [facility] ,MS, CCC-SLP  07/23/2019, 3:32 PM  [facility] [address]  [place], [place], [address] Phone: [phone]   Fax:  [phone]  Name: Thomas Norman  [place] MRN: [id number] Date of Birth: 1951/04/01

## 2019-07-23 NOTE — Patient Instructions (Signed)
We will work on some higher level cognitive tasks

## 2019-07-30 ENCOUNTER — Ambulatory Visit: Payer: Medicare Other | Attending: Internal Medicine | Admitting: Occupational Therapy

## 2019-07-30 ENCOUNTER — Encounter: Payer: Self-pay | Admitting: Occupational Therapy

## 2019-07-30 ENCOUNTER — Other Ambulatory Visit: Payer: Self-pay

## 2019-07-30 DIAGNOSIS — R41841 Cognitive communication deficit: Secondary | ICD-10-CM | POA: Insufficient documentation

## 2019-07-30 DIAGNOSIS — R41840 Attention and concentration deficit: Secondary | ICD-10-CM

## 2019-07-30 DIAGNOSIS — R2689 Other abnormalities of gait and mobility: Secondary | ICD-10-CM | POA: Insufficient documentation

## 2019-07-30 DIAGNOSIS — R2681 Unsteadiness on feet: Secondary | ICD-10-CM | POA: Diagnosis not present

## 2019-07-30 NOTE — Therapy (Signed)
[facility] [address] [place], [place], [address] Phone: [phone]   Fax:  [phone]  Occupational Therapy Evaluation  Patient Details  Name: Thomas Norman MRN: [id number] Date of Birth: [birth year] Referring Provider (OT): Dr. Allie Bossier   Encounter Date: 07/30/2019  OT End of Session - 07/30/19 1207    Visit Number  1   eval only   Number of Visits  1    Authorization Type  Medicare & BCBS--covered 100%    OT Start Time  0935    OT Stop Time  1012    OT Time Calculation (min)  37 min    Activity Tolerance  Patient tolerated treatment well    Behavior During Therapy  [facility] for tasks assessed/performed       Past Medical History:  Diagnosis Date  . Hypertension   . Kidney calculi     Past Surgical History:  Procedure Laterality Date  . LITHOTRIPSY    . skin cancers    . TONSILLECTOMY      There were no vitals filed for this visit.  Subjective Assessment - 07/30/19 0938    Subjective   Wife reports that pt is improving everyday,  Pt/wife report that pt has always had some trouble with memory/attention for things unless they were important to him.    Patient is accompanied by:  Family member   wife   Pertinent History  Hemorrhagic stroke.  PMH:  HTN, HLD    Patient Stated Goals  speech is what I need the most    Currently in Pain?  No/denies        [facility] OT Assessment - 07/30/19 0001      Assessment   Medical Diagnosis  L parietal hemorrhagic CVA    Referring Provider (OT)  Dr. Allie Bossier    Onset Date/Surgical Date  07/14/19    Hand Dominance  Right    Prior Therapy  N/A      Precautions   Precautions  Other (comment)   "but I don't think I can drive"     Balance Screen   Has the patient fallen in the past 6 months  Yes   slipped with pulling approx 2 months ago, hit head, no LOC   How many times?  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, sports    mostly around the house   Leisure  likes doing things around the house, active/projects, truck driving, enjoys travel and eating out, being outside   walked around the block yesterday     ADL   ADL comments  BADLs mod I      IADL   Prior Level of Function Light Housekeeping  mod I with light house    Prior Level of Function Meal Prep  wife performed cooking prior    Prior Level of Function Community Mobility  drove    [employer] on family or friends for transportation    Medication Management  Is responsible for taking medication in correct dosages at correct time    Prior Level of Function Financial Management  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 [place]   denies dizziness now     Vision - History   Baseline Vision  Wears glasses all the time   primarily for reading     [place] not tested   However, pt/wife deny visual changes     Cognition   Overall Cognitive Status  Impaired/Different from baseline    Area of Impairment  Memory;Awareness;Attention    Attention Comments  5/6 on Attention section of MOCA (with difficulty with repeating numbers backwards)    Memory Comments  per wife and 2/5 correct on delayed recall section of MOCA    Awareness Comments  "I've always been a lazy thinker"  "I think I could drive, but I don't think my wife would let me"      Behaviors  --   Verbose vs. personality   Cognition Comments  Pt scored 22/30 on MOCA (>or equal to 26/30 WNL).  Visuospatial/Exeutive (4/5 with proportions off with cube), Naming 2/3, Attention 5/6, Language 1/3, Delayed recall 2/5, Abstraction 2/2, Orientation 6/6.   Pt also reports word  finding difficulty.     Sensation   Light Touch  Appears Intact    Additional Comments  denies change      Coordination   9 Hole Peg Test  Right;Left    Right 9 Hole Peg Test  18.69    Left 9 Hole Peg Test  20.04      ROM / Strength   AROM / PROM / Strength  AROM;Strength      AROM   Overall AROM   Within functional limits for tasks performed    Overall AROM Comments  BUEs WNL      Strength   Overall Strength  Within functional limits for tasks performed    Overall Strength Comments  --   BUE proximal strength grossly 5/5      Hand Function   Right Hand Grip (lbs)  99.6    Left Hand Grip (lbs)  89.9                      OT Education - 07/30/19 1444    Education Details  OT eval results    Person(s) Educated  Patient;Spouse    Methods  Explanation    Comprehension  Verbalized understanding                 Plan - 07/30/19 1223    Clinical Impression Statement  Pt is a 68 y.o. male s/p hemorrhagic stroke 07/14/19.  Pt with PMH that includes HTN and HLD.  Pt was independent prior to CVA.  Pt presents with mild cognitive deficits; however, pt appears to demo more difficulty with cognitive communication/linguistic tasks.  Pt/wife report that pt is performing previous ADLs and IADLs within the home independently (financial management/bookkeeping, medication management, home maintenance tasks, BADLs) and is awaiting physician clearance for more strenuous activities.  UE strength/coordination also WNL.  Therefore, no further occupational therapy needed at this time.  Pt to continue with physical and speech therapies to address remaining deficits.    OT Occupational Profile and History  Detailed Assessment- Review of Records and additional review of physical, cognitive, psychosocial history related to current functional performance    Occupational performance deficits (Please refer to evaluation for details):  IADL's;Leisure;Social Participation    Cognitive  Skills  Memory;Attention    Clinical Decision Making  Limited treatment options, no task modification necessary    Comorbidities Affecting Occupational Performance:  None    Modification or Assistance to Complete Evaluation   No modification of tasks or assist necessary to complete eval    OT Frequency  One time visit   eval only   OT Treatment/Interventions  Self-care/ADL training;Patient/family education    Plan  no further occupational therapy needed at this time.  Pt/wife verbalize agreement with plan/recommendation.   Pt will continue with Physical therapy and with Speech therapy to address cognitive communication/language deficits.    Consulted and Agree with Plan of Care  Patient;Family member/caregiver    Family Member Consulted  wife       Patient will benefit from skilled therapeutic intervention in order to improve the following deficits and impairments:     Cognitive Skills: Memory, Attention     Visit Diagnosis: Attention and concentration deficit    Problem List Patient Active Problem List   Diagnosis Date Noted  . ICH (intracerebral hemorrhage) ([place]) 07/14/2019  . Hemorrhagic stroke ([place]) 07/14/2019  . Acute respiratory failure ([place])   . HYPERLIPIDEMIA 06/11/2007  . HYPERTENSION 06/11/2007  . ALLERGIC RHINITIS 06/11/2007    [facility] 07/30/2019, 2:48 PM  [facility] [address] [place], [place], [address] Phone: [phone]   Fax:  [phone]  Name: Thomas Norman MRN: [id number] Date of Birth: 06-Sep-1951   Vianne Bulls, OTR/L [facility] [address] [place], [place]  [address] [phone] phone [phone] 07/30/19 2:48 PM

## 2019-08-01 ENCOUNTER — Ambulatory Visit: Payer: Medicare Other

## 2019-08-01 ENCOUNTER — Other Ambulatory Visit: Payer: Self-pay

## 2019-08-01 ENCOUNTER — Ambulatory Visit: Payer: Medicare Other | Admitting: Physical Therapy

## 2019-08-01 ENCOUNTER — Encounter: Payer: Self-pay | Admitting: Physical Therapy

## 2019-08-01 DIAGNOSIS — R2689 Other abnormalities of gait and mobility: Secondary | ICD-10-CM

## 2019-08-01 DIAGNOSIS — I10 Essential (primary) hypertension: Secondary | ICD-10-CM | POA: Diagnosis not present

## 2019-08-01 DIAGNOSIS — R2681 Unsteadiness on feet: Secondary | ICD-10-CM | POA: Diagnosis not present

## 2019-08-01 DIAGNOSIS — R399 Unspecified symptoms and signs involving the genitourinary system: Secondary | ICD-10-CM | POA: Diagnosis not present

## 2019-08-01 DIAGNOSIS — R569 Unspecified convulsions: Secondary | ICD-10-CM | POA: Diagnosis not present

## 2019-08-01 DIAGNOSIS — Z1211 Encounter for screening for malignant neoplasm of colon: Secondary | ICD-10-CM | POA: Diagnosis not present

## 2019-08-01 DIAGNOSIS — R41840 Attention and concentration deficit: Secondary | ICD-10-CM | POA: Diagnosis not present

## 2019-08-01 DIAGNOSIS — I629 Nontraumatic intracranial hemorrhage, unspecified: Secondary | ICD-10-CM | POA: Diagnosis not present

## 2019-08-01 DIAGNOSIS — E7800 Pure hypercholesterolemia, unspecified: Secondary | ICD-10-CM | POA: Diagnosis not present

## 2019-08-01 DIAGNOSIS — R41841 Cognitive communication deficit: Secondary | ICD-10-CM

## 2019-08-01 DIAGNOSIS — I6990 Unspecified sequelae of unspecified cerebrovascular disease: Secondary | ICD-10-CM | POA: Diagnosis not present

## 2019-08-01 NOTE — Therapy (Signed)
[facility] [address] [place], [place], [address] Phone: [phone]   Fax:  [phone]  Physical Therapy Treatment  Patient Details  Name: Thomas Norman MRN: [id number] Date of Birth: 1951-02-01 Referring Provider (PT): Dia Crawford, MD Hospitalist, Will be following up with Frann Rider at [facility]   Encounter Date: 08/01/2019  PT End of Session - 08/01/19 1216    Visit Number  2    Number of Visits  6    Date for PT Re-Evaluation  09/20/19    Authorization Type  Medicare    PT Start Time  0933    PT Stop Time  1015    PT Time Calculation (min)  42 min    Activity Tolerance  Patient tolerated treatment well    Behavior During Therapy  [facility] for tasks assessed/performed       Past Medical History:  Diagnosis Date  . Hypertension   . Kidney calculi     Past Surgical History:  Procedure Laterality Date  . LITHOTRIPSY    . skin cancers    . TONSILLECTOMY      There were no vitals filed for this visit.  Subjective Assessment - 08/01/19 0936    Subjective  No falls. No new complaints. No dizziness. Everyday is feeling a little bit better. No headaches. Still having difficulty balancing on his right leg.    Patient is accompained by:  Family member   [name], wife   Pertinent History  HTN, HLD    Diagnostic tests  CT/MRI showed small L parietal hemorrhage.    Patient Stated Goals  wants to work as hard as he can, wants to improve his balance and be better than he has been before    Currently in Pain?  No/denies                        [facility] Adult PT Treatment/Exercise - 08/01/19 0001      Ambulation/Gait   Ambulation/Gait  Yes    Ambulation/Gait Assistance  6: Modified independent (Device/Increase time)    Ambulation Distance (Feet)  600 Feet    Assistive device  None    Gait Pattern  Step-through pattern;Within Functional Limits    Ambulation Surface  Outdoor;Unlevel;Paved;Grass       High Level Balance   High Level Balance Comments  In corner standing on rocker board A/P weight shifts with eyes open progresing to eyes closed - multiple reps, min guard for balance.        Access Code: [email or web address]  URL: [email or web address]  Date: 08/01/2019  Prepared by: Janann August   Initiated HEP:   Exercises Romberg Stance on Foam Pad - 10 reps - 2 sets - 2x daily - 7x weekly - head nods, head turns, diagonals to R and L  Half Tandem Stance with Eyes Closed on Foam Pad - 3 sets - 30 hold - 2x daily - 7x weekly Tandem Stance - 3 sets - 30 hold - 2x daily - 7x weekly - hold for 30 seconds at countertop  Tandem Walking with Counter Support - 2 sets - 2x daily - 7x weekly - forwards and backwards  Heel Walking - 2 sets - 7x weekly - forwards and backwards  Single Leg Stance - 3 sets - 20 hold - 2x daily - 7x weekly - cues for soft bend in the knee, hold for 15 seconds at countertop.       PT Education -  08/01/19 1216    Education Details  initial HEP for balance, initiated walking program 10 minutes per day on level surfaces.    Person(s) Educated  Patient;Spouse    Methods  Explanation;Demonstration;Handout    Comprehension  Verbalized understanding;Returned demonstration       PT Short Term Goals - 07/22/19 1422      PT SHORT TERM GOAL #1   Title  ALL STGS = LTGS        PT Long Term Goals - 07/22/19 2110      PT LONG TERM GOAL #1   Title  Patient will be independent with final HEP in order to build upon functional gains made in therapy. ALL LTGS DUE 09/02/19    Time  6    Period  Weeks    Target Date  09/02/19      PT LONG TERM GOAL #2   Title  Patient will improve his FGA score to at least a 28/30 in order to demonstrate decreased fall risk.    Baseline  21/30 on 07/22/19    Time  6    Period  Weeks    Status  New      PT LONG TERM GOAL #3   Title  Patient will ambulate at least 500' outdoors on unlevel surfaces while scanning environment  with mod I in order to improve community mobility and get back to working in the yard.    Time  6    Period  Weeks    Status  New      PT LONG TERM GOAL #4   Title  Pt and wife will verbalize understanding of the signs and symptoms of a stroke as well as risk factors.    Time  6    Period  Weeks    Status  New      PT LONG TERM GOAL #5   Title  Pt will report 0/10 dizziness after performing turns during gait.    Time  6    Period  Weeks    Status  New      Additional Long Term Goals   Additional Long Term Goals  Yes      PT LONG TERM GOAL #6   Title  Patient will improve gait speed to at least 3.8 ft/sec in order demonstrate improve mobility.    Baseline  3.12 ft/sec on 07/22/19    Time  6    Period  Weeks    Status  New            Plan - 08/01/19 1217    Clinical Impression Statement  Focus of today's session was initiating pt's HEP for higher level dynamic balance and on compliant surfaces. Had discussion with pt and wife to begin a walking program for approx. 10 minutes per day walking on level surfaces. Pt with no reports of dizziness throughout today's session today. Pt is progressing well and will continue to benefit from skilled PT to progress towards LTGs.    Personal Factors and Comorbidities  Comorbidity 2;Past/Current Experience    Comorbidities  HTN, HLD    Examination-Activity Limitations  Locomotion Level;Bathing   bathing - performing SLS in shower   Examination-Participation Restrictions  Yard Work    Stability/Clinical Decision Making  Stable/Uncomplicated    Rehab Potential  Excellent    PT Frequency  1x / week    PT Duration  --   5 weeks   PT Treatment/Interventions  ADLs/Self Care Home Management;Gait  training;Functional mobility training;Therapeutic activities;Neuromuscular re-education;Balance training;Therapeutic exercise;Patient/family education;Vestibular    PT Next Visit Plan  higher level balance on compliant surfaces with narrow BOS and eyes  closed, SLS activities    PT Home Exercise Plan  [email or web address]    Consulted and Agree with Plan of Care  Family member/caregiver    Family Member Consulted  pt's wife [name]       Patient will benefit from skilled therapeutic intervention in order to improve the following deficits and impairments:  Abnormal gait, Decreased balance, Dizziness, Difficulty walking  Visit Diagnosis: Unsteadiness on feet  Other abnormalities of gait and mobility     Problem List Patient Active Problem List   Diagnosis Date Noted  . ICH (intracerebral hemorrhage) ([place]) 07/14/2019  . Hemorrhagic stroke ([place]) 07/14/2019  . Acute respiratory failure ([place])   . HYPERLIPIDEMIA 06/11/2007  . HYPERTENSION 06/11/2007  . ALLERGIC RHINITIS 06/11/2007    Arliss Journey, PT, DPT  08/01/2019, 12:24 PM  [facility] [address] [place], [place], [address] Phone: [phone]   Fax:  [phone]  Name: RYZE BACKE MRN: [id number] Date of Birth: [birth year]

## 2019-08-01 NOTE — Patient Instructions (Signed)
Access Code: [email or web address]  URL: [email or web address]  Date: 08/01/2019  Prepared by: Janann August   Exercises Romberg Stance on Foam Pad - 10 reps - 2 sets - 2x daily - 7x weekly Standing Romberg to 3/4 Tandem Stance - 10 reps - 3 sets - 1x daily - 7x weekly Half Tandem Stance with Eyes Closed on Foam Pad - 3 sets - 30 hold - 2x daily - 7x weekly Tandem Stance - 3 sets - 30 hold - 2x daily - 7x weekly Tandem Walking with Counter Support - 2 sets - 2x daily - 7x weekly Heel Walking - 2 sets - 7x weekly Single Leg Stance - 3 sets - 20 hold - 2x daily - 7x weekly

## 2019-08-01 NOTE — Patient Instructions (Addendum)
Memory Compensation Strategies   1. Use "WARM" strategy. W= write it down A=  associate it R=  repeat it M=  make a mental picture  2. You can keep a Social worker. Use a 3-ring notebook with sections for the following:  calendar, important names and phone numbers, medications, doctors' names/phone numbers, "to do list"/reminders, and a section to journal what you did each day  3. Use a calendar to write appointments down.  4. Write yourself a schedule for the day.  This can be placed on the calendar or in a separate section of the Memory Notebook.  Keeping a regular schedule can help memory.  5. Use medication organizer with sections for each day or morning/evening pills  You may need help loading it  6. Keep a basket, or pegboard by the door.   Place items that you need to take out with you in the basket or on the pegboard.  You may also want to include a message board for reminders.  7. Use sticky notes. Place sticky notes with reminders in a place where the task is performed.  For example:  "turn off the stove" placed by the stove, "lock the door" placed on the door at eye level, "take your medications" on the bathroom mirror or by the place where you normally take your medications  8. Use alarms/timers.  Use while cooking to remind yourself to check on food or as a reminder to take your medicine, or as a reminder to make a call, or as a reminder to perform another task, etc.  9. Use a small tape recorder to record important information and notes for yourself.   THINK ABOUT what strategies might be helpful for you and let's talk about this next time.

## 2019-08-01 NOTE — Therapy (Signed)
[facility] [address] [place], [place], [address] Phone: [phone]   Fax:  [phone]  Speech Language Pathology Treatment  Patient Details  Name: Thomas Norman MRN: [id number] Date of Birth: [birth year] Referring Provider (SLP): Dia Crawford (3W); notes sent to PCP- Gaynelle Arabian, MD   Encounter Date: 08/01/2019  End of Session - 08/01/19 1151    Visit Number  2    Number of Visits  17    Date for SLP Re-Evaluation  10/21/19    SLP Start Time  1022    SLP Stop Time   1110    SLP Time Calculation (min)  48 min    Activity Tolerance  Patient tolerated treatment well       Past Medical History:  Diagnosis Date  . Hypertension   . Kidney calculi     Past Surgical History:  Procedure Laterality Date  . LITHOTRIPSY    . skin cancers    . TONSILLECTOMY      There were no vitals filed for this visit.  Subjective Assessment - 08/01/19 1029    Subjective  "I could have been a little neater (on the clock)."    Patient is accompained by:  Family member   wife   Currently in Pain?  No/denies             ADULT SLP TREATMENT - 08/01/19 1216      General Information   Behavior/Cognition  Alert;Cooperative;Pleasant mood      Pain Assessment   Pain Assessment  No/denies pain      Cognitive-Linquistic Treatment   Treatment focused on  Cognition    Skilled Treatment  Completed CLQT. See "impression" for details.      Assessment / Recommendations / Plan   Plan  Goals updated      Progression Toward Goals   Progression toward goals  Progressing toward goals   goals updated      SLP Education - 08/01/19 1112    Education Details  novel tasks may be cognitively more difficult than tasks done routinely, need to compare memory now and memory premorbidly to get idea of how/where to compensate, deficit areas    Person(s) Educated  Patient;Spouse    Methods  Explanation;Demonstration     Comprehension  Verbalized understanding;Need further instruction       SLP Short Term Goals - 08/01/19 1207      SLP SHORT TERM GOAL #1   Title  pt will complete cognitive testing in first 1-2 sessions    Status  Achieved      SLP [place] #2   Title  pt will name 3 non-physical deficit areas    Time  4    Period  Weeks    Status  On-going      SLP SHORT TERM GOAL #3   Title  pt will demo emergent awareness of errors on therapy tasks with rare min A in 3 sessions    Time  4    Period  Weeks    Status  New      SLP SHORT TERM GOAL #4   Title  pt will demo alternating attention in two simple cognitive-linguistic therapy tasks over three sessions    Time  4    Period  Weeks    Status  New      SLP SHORT TERM GOAL #5   Title  pt will demonstrate anticipatory awareness for novel tasks by taking  time to process requirements and how to organize the task in 80% opportunities over 3 sessions    Time  4    Period  Weeks    Status  New       SLP Long Term Goals - 08/01/19 1213      [place] #1   Title  pt will demo anticipatory awareness by double checking work 100% of the time in therapy sessions, over 3 sessions    Time  8    Period  Weeks   or 17 sessions, for all LTGs   Status  Revised      SLP LONG TERM GOAL #2   Title  pt will demo divided attention with a mod complex task and a simple linguistic task over three sessions    Time  8    Period  Weeks    Status  New      SLP LONG TERM GOAL #3   Title  in complex functional tasks, pt will demo appropriate organization, awareness, and attention to detail for 100% accuracy over 5 sessions    Time  8    Period  Weeks    Status  New       Plan - 08/01/19 1151    Clinical Impression Statement  Pt completed CLQT today and scored in the low WNL range (3.6, where cutoff between WNL and mild deficits is 3.5). SLP unsure if attention is decr'd or not at this point; will leave attention goal in the chance that  attention is reduced, compared premorbidly. Reduced memory, and reduced attention to detail was seen in CLQT tasks. Reduced organization and processing of information was seen in novel tasks Radiographer, therapeutic, clock drawing) on CLQT. Reduced intellectual awareness (pt unaware of decr'd memory compared to premorbid level - wife had to cue to for this). SLP added goals as appropriate. Pt would benefit from skilled ST to target these higher-level areas of cognitive communicaiton.    Speech Therapy Frequency  2x / week    Duration  --   8 weeks or 17 sessions   Treatment/Interventions  SLP instruction and feedback;Compensatory strategies;Patient/family education;Internal/external aids;Cognitive reorganization;Functional tasks    Potential to Achieve Goals  Good    Consulted and Agree with Plan of Care  Patient       Patient will benefit from skilled therapeutic intervention in order to improve the following deficits and impairments:   Cognitive communication deficit    Problem List Patient Active Problem List   Diagnosis Date Noted  . ICH (intracerebral hemorrhage) ([facility]) 07/14/2019  . Hemorrhagic stroke ([facility]) 07/14/2019  . Acute respiratory failure ([facility])   . HYPERLIPIDEMIA 06/11/2007  . HYPERTENSION 06/11/2007  . ALLERGIC RHINITIS 06/11/2007    [facility] ,[place], CCC-SLP  08/01/2019, 1:06 PM  [facility] [address] [place], [place], [address] Phone: [phone]   Fax:  [phone]   Name: Thomas Norman MRN: [id number] Date of Birth: [birth year]

## 2019-08-05 ENCOUNTER — Ambulatory Visit: Payer: Medicare Other | Admitting: Physical Therapy

## 2019-08-05 ENCOUNTER — Other Ambulatory Visit: Payer: Self-pay

## 2019-08-05 ENCOUNTER — Ambulatory Visit: Payer: Medicare Other | Admitting: Occupational Therapy

## 2019-08-05 ENCOUNTER — Encounter: Payer: Self-pay | Admitting: Physical Therapy

## 2019-08-05 ENCOUNTER — Ambulatory Visit: Payer: Medicare Other

## 2019-08-05 DIAGNOSIS — R2689 Other abnormalities of gait and mobility: Secondary | ICD-10-CM

## 2019-08-05 DIAGNOSIS — R41840 Attention and concentration deficit: Secondary | ICD-10-CM | POA: Diagnosis not present

## 2019-08-05 DIAGNOSIS — R41841 Cognitive communication deficit: Secondary | ICD-10-CM

## 2019-08-05 DIAGNOSIS — R2681 Unsteadiness on feet: Secondary | ICD-10-CM

## 2019-08-05 NOTE — Therapy (Signed)
[facility] [address] [place], [place], [address] Phone: [phone]   Fax:  [phone]  Speech Language Pathology Treatment  Patient Details  Name: Thomas Norman MRN: [id number] Date of Birth: 31-Aug-1951 Referring Provider (SLP): Dia Crawford (3W); notes sent to PCP- Gaynelle Arabian, MD   Encounter Date: 08/05/2019  End of Session - 08/05/19 1638    Visit Number  3    Number of Visits  17    Date for SLP Re-Evaluation  10/21/19    SLP Start Time  0935    SLP Stop Time   1016    SLP Time Calculation (min)  41 min    Activity Tolerance  Patient tolerated treatment well       Past Medical History:  Diagnosis Date  . Hypertension   . Kidney calculi     Past Surgical History:  Procedure Laterality Date  . LITHOTRIPSY    . skin cancers    . TONSILLECTOMY      There were no vitals filed for this visit.  Subjective Assessment - 08/05/19 0939    Subjective  "Chloe. She had me working a lot on balance."    Patient is accompained by:  Family member   [name]   Currently in Pain?  No/denies            ADULT SLP TREATMENT - 08/05/19 0940      General Information   Behavior/Cognition  Alert;Cooperative;Pleasant mood      Pain Assessment   Pain Assessment  No/denies pain      Cognitive-Linquistic Treatment   Treatment focused on  Cognition    Skilled Treatment  SLP educated pt/wife about memory strategies. Pt told SLP 4-5 things he did over the weekend, correctly. SLP educated pt re: stroke signs/symptoms due to pt saying "I know the stroke acronym - FAST." Pt was asked 10 minutes later and recalled 3/4 (no "face"). Pt continuing to minmize difficulties - pt denied losing details of conversations and wife stated pt is doing so a little more than pre-CVA. SLP encouraged pt to use compensation if this "causes difficulty" in some way. Pt told SLP 3/4 memory strategies after 10 minutes (forgot "picture" on  mental picture). Pt indicated he did not think him losing some details in conversations would bring difficulty but if it did he would certainly make some changes to his daily routine. SLP had pt complete detailed written instructions and pt did not follow detiails of one direction ("dot on top" - pt put dot beside)      Assessment / Recommendations / Plan   Plan  Continue with current plan of care      Progression Toward Goals   Progression toward goals  --   pt cont to minimize deficits        SLP Short Term Goals - 08/05/19 1640      SLP SHORT TERM GOAL #1   Title  pt will complete cognitive testing in first 1-2 sessions    Status  Achieved      SLP SHORT TERM GOAL #2   Title  pt will name 3 non-physical deficit areas    Time  3    Period  Weeks    Status  On-going      SLP SHORT TERM GOAL #3   Title  pt will demo emergent awareness of errors on therapy tasks with rare min A in 3 sessions    Time  3  Period  Weeks    Status  On-going      SLP SHORT TERM GOAL #4   Title  pt will demo alternating attention in two simple cognitive-linguistic therapy tasks over three sessions    Time  3    Period  Weeks    Status  On-going      SLP SHORT TERM GOAL #5   Title  pt will demonstrate anticipatory awareness for novel tasks by taking time to process requirements and how to organize the task in 80% opportunities over 3 sessions    Time  3    Period  Weeks    Status  On-going       SLP Long Term Goals - 08/05/19 [place] #1   Title  pt will demo anticipatory awareness by double checking work 100% of the time in therapy sessions, over 3 sessions    Time  7    Period  Weeks   or 17 sessions, for all LTGs   Status  On-going      SLP LONG TERM GOAL #2   Title  pt will demo divided attention with a mod complex task and a simple linguistic task over three sessions    Time  7    Period  Weeks    Status  On-going      SLP LONG TERM GOAL #3   Title  in  complex functional tasks, pt will demo appropriate organization, awareness, and attention to detail for 100% accuracy over 5 sessions    Time  7    Period  Weeks    Status  On-going       Plan - 08/05/19 1639    Clinical Impression Statement  Pt  exhibited reduced memory, and reduced attention to detail in therarpy tasks today. Pt cont to minimize deficits. Pt would benefit from skilled ST to target these higher-level areas of cognitive communicaiton.    Speech Therapy Frequency  2x / week    Duration  --   8 weeks or 17 sessions   Treatment/Interventions  SLP instruction and feedback;Compensatory strategies;Patient/family education;Internal/external aids;Cognitive reorganization;Functional tasks    Potential to Achieve Goals  Good    Consulted and Agree with Plan of Care  Patient       Patient will benefit from skilled therapeutic intervention in order to improve the following deficits and impairments:   Cognitive communication deficit    Problem List Patient Active Problem List   Diagnosis Date Noted  . ICH (intracerebral hemorrhage) ([facility]) 07/14/2019  . Hemorrhagic stroke ([facility]) 07/14/2019  . Acute respiratory failure ([facility])   . HYPERLIPIDEMIA 06/11/2007  . HYPERTENSION 06/11/2007  . ALLERGIC RHINITIS 06/11/2007    [facility] ,MS, CCC-SLP  08/05/2019, 4:42 PM  [facility] [address] [place], [place], [address] Phone: [phone]   Fax:  [phone]   Name: Thomas Norman MRN: [id number] Date of Birth: 1951-02-05

## 2019-08-05 NOTE — Patient Instructions (Signed)
  Please complete the assigned speech therapy homework prior to your next session and return it to the speech therapist at your next visit.  

## 2019-08-05 NOTE — Therapy (Signed)
[facility] [address] [place], [place], [address] Phone: [phone]   Fax:  [phone]  Physical Therapy Treatment  Patient Details  Name: Thomas Norman MRN: [id number] Date of Birth: November 19, 1950 Referring Provider (PT): Dia Crawford, MD Hospitalist, Will be following up with Frann Rider at [facility]   Encounter Date: 08/05/2019  PT End of Session - 08/05/19 0932    Visit Number  3    Number of Visits  6    Date for PT Re-Evaluation  09/20/19    Authorization Type  Medicare    PT Start Time  0848    PT Stop Time  0928    PT Time Calculation (min)  40 min    Equipment Utilized During Treatment  Gait belt    Activity Tolerance  Patient tolerated treatment well    Behavior During Therapy  [facility] for tasks assessed/performed       Past Medical History:  Diagnosis Date  . Hypertension   . Kidney calculi     Past Surgical History:  Procedure Laterality Date  . LITHOTRIPSY    . skin cancers    . TONSILLECTOMY      There were no vitals filed for this visit.  Subjective Assessment - 08/05/19 0851    Subjective  No new complaints. Exercises have been going well at home. Can tell that the more he is doing them the better he is doing.  No dizziness.    Patient is accompained by:  Family member   [name], wife   Pertinent History  HTN, HLD    Diagnostic tests  CT/MRI showed small L parietal hemorrhage.    Patient Stated Goals  wants to work as hard as he can, wants to improve his balance and be better than he has been before    Currently in Pain?  No/denies                       [facility] Adult PT Treatment/Exercise - 08/05/19 0001      High Level Balance   High Level Balance Comments  Standing on blue beam in // bars: tandem walking forward and backwards down and back 4 reps with no UE support, static tandem stance holds 2 x 30 seconds B, static tandem holds with multi-directional ball toss progressing  to adding a cognitive challenge (pt naming different states across the [place]) - increased difficulty by increasing speed of movement. SLS on blue foam beam 1 x 10 reps forward/lateral/posterior kicks B. Standing on rocker board narrow BOS eyes closed 2 x 30 seconds - wider BOS eyes closed 3 x 5 reps vertical head nods, 3 x 5 reps horizontal head turns. Standing on rocker board SLS double cone taps 1 x 10 reps B, progressing to stepping on/off onto rockerboard  and SLS tap on single cone.                PT Short Term Goals - 07/22/19 1422      PT SHORT TERM GOAL #1   Title  ALL STGS = LTGS        PT Long Term Goals - 07/22/19 2110      PT LONG TERM GOAL #1   Title  Patient will be independent with final HEP in order to build upon functional gains made in therapy. ALL LTGS DUE 09/02/19    Time  6    Period  Weeks    Target Date  09/02/19      PT LONG TERM GOAL #2   Title  Patient will improve his FGA score to at least a 28/30 in order to demonstrate decreased fall risk.    Baseline  21/30 on 07/22/19    Time  6    Period  Weeks    Status  New      PT LONG TERM GOAL #3   Title  Patient will ambulate at least 500' outdoors on unlevel surfaces while scanning environment with mod I in order to improve community mobility and get back to working in the yard.    Time  6    Period  Weeks    Status  New      PT LONG TERM GOAL #4   Title  Pt and wife will verbalize understanding of the signs and symptoms of a stroke as well as risk factors.    Time  6    Period  Weeks    Status  New      PT LONG TERM GOAL #5   Title  Pt will report 0/10 dizziness after performing turns during gait.    Time  6    Period  Weeks    Status  New      Additional Long Term Goals   Additional Long Term Goals  Yes      PT LONG TERM GOAL #6   Title  Patient will improve gait speed to at least 3.8 ft/sec in order demonstrate improve mobility.    Baseline  3.12 ft/sec on 07/22/19    Time  6    Period   Weeks    Status  New            Plan - 08/05/19 1221    Clinical Impression Statement  Focus of today's session included higher level balance on compliant surfaces focusing on dual tasking, narrow BOS, SLS, and eyes closed. Pt with most difficulty with tandem stance with cognitive challenge and ball toss - needing to utilize stepping strategy when losing balance on foam balance beam. Pt needing min guard today for head nods up and down with eyes closed on rocker board due to tendency for posterior LOB onto heels. Pt is progressing well and will continue to benefit from skilled PT to address impairments listed below.    Personal Factors and Comorbidities  Comorbidity 2;Past/Current Experience    Comorbidities  HTN, HLD    Examination-Activity Limitations  Locomotion Level;Bathing   bathing - performing SLS in shower   Examination-Participation Restrictions  Yard Work    Stability/Clinical Decision Making  Stable/Uncomplicated    Rehab Potential  Excellent    PT Frequency  1x / week    PT Duration  --   5 weeks   PT Treatment/Interventions  ADLs/Self Care Home Management;Gait training;Functional mobility training;Therapeutic activities;Neuromuscular re-education;Balance training;Therapeutic exercise;Patient/family education;Vestibular    PT Next Visit Plan  higher level balance on compliant surfaces with narrow BOS and eyes closed, SLS activities    PT Home Exercise Plan  [id number]    Consulted and Agree with Plan of Care  Family member/caregiver    Family Member Consulted  pt's wife [name]       Patient will benefit from skilled therapeutic intervention in order to improve the following deficits and impairments:  Abnormal gait, Decreased balance, Dizziness, Difficulty walking  Visit Diagnosis: Unsteadiness on feet  Other abnormalities of gait and mobility     Problem List Patient Active Problem List  Diagnosis Date Noted  . ICH (intracerebral hemorrhage) ([place]) 07/14/2019  .  Hemorrhagic stroke ([place]) 07/14/2019  . Acute respiratory failure ([place])   . HYPERLIPIDEMIA 06/11/2007  . HYPERTENSION 06/11/2007  . ALLERGIC RHINITIS 06/11/2007    Arliss Journey, PT, DPT  08/05/2019, 12:23 PM  [facility] [address] [place], [place], [address] Phone: [phone]   Fax:  [phone]  Name: OMARIO GUMAER MRN: [id number] Date of Birth: 08-24-1951

## 2019-08-07 ENCOUNTER — Other Ambulatory Visit: Payer: Self-pay

## 2019-08-07 ENCOUNTER — Ambulatory Visit: Payer: Medicare Other

## 2019-08-07 DIAGNOSIS — R2689 Other abnormalities of gait and mobility: Secondary | ICD-10-CM | POA: Diagnosis not present

## 2019-08-07 DIAGNOSIS — R41840 Attention and concentration deficit: Secondary | ICD-10-CM | POA: Diagnosis not present

## 2019-08-07 DIAGNOSIS — R41841 Cognitive communication deficit: Secondary | ICD-10-CM

## 2019-08-07 DIAGNOSIS — R2681 Unsteadiness on feet: Secondary | ICD-10-CM | POA: Diagnosis not present

## 2019-08-07 NOTE — Therapy (Signed)
[facility] [address] [place], [place], [address] Phone: [phone]   Fax:  [phone]  Speech Language Pathology Treatment  Patient Details  Name: CORBIN KRAMPITZ MRN: [id number] Date of Birth: Aug 26, 1951 Referring Provider (SLP): Dia Crawford (3W); notes sent to PCP- Gaynelle Arabian, MD   Encounter Date: 08/07/2019  End of Session - 08/07/19 1135    Visit Number  4    Number of Visits  17    Date for SLP Re-Evaluation  10/21/19    SLP Start Time  1021    SLP Stop Time   1101    SLP Time Calculation (min)  40 min    Activity Tolerance  Patient tolerated treatment well       Past Medical History:  Diagnosis Date  . Hypertension   . Kidney calculi     Past Surgical History:  Procedure Laterality Date  . LITHOTRIPSY    . skin cancers    . TONSILLECTOMY      There were no vitals filed for this visit.  Subjective Assessment - 08/07/19 1023    Currently in Pain?  No/denies            ADULT SLP TREATMENT - 08/07/19 1024      General Information   Behavior/Cognition  Alert;Cooperative;Pleasant mood      Pain Assessment   Pain Assessment  No/denies pain      Cognitive-Linquistic Treatment   Treatment focused on  Cognition    Skilled Treatment  Pt recalled memory strategies acronym and specific strategies 4/4. SLP asked pt about his homewrok - took "probably 4 sheets" for him to get final copy of [employer]. "When I tell you this (how he has used memory strategies) you better look at [facility] to really see." Pt has been writing down questions for NP at neurologist office. In detailed task (organizing your day) pt with 100% success, checked answers, and pre-planning prior to begining work.        Assessment / Recommendations / Plan   Plan  Continue with current plan of care      Progression Toward Goals   Progression toward goals  Progressing toward goals       SLP Education - 08/07/19 1135     Education Details  novel tasks will require more pre-planning than prior to hospitalization    Person(s) Educated  Patient;Spouse    Methods  Explanation    Comprehension  Verbalized understanding       SLP Short Term Goals - 08/07/19 1032      SLP SHORT TERM GOAL #1   Title  pt will complete cognitive testing in first 1-2 sessions    Status  Achieved      SLP SHORT TERM GOAL #2   Title  pt will name 3 non-physical deficit areas    Time  3    Period  Weeks    Status  On-going      SLP SHORT TERM GOAL #3   Title  pt will demo emergent awareness of errors on therapy tasks with rare min A in 3 sessions    Time  3    Period  Weeks    Status  On-going      SLP SHORT TERM GOAL #4   Title  pt will demo alternating attention in two simple cognitive-linguistic therapy tasks over three sessions    Time  3    Period  Weeks    Status  On-going      SLP SHORT TERM GOAL #5   Title  pt will demonstrate anticipatory awareness for novel tasks by taking time to process requirements and how to organize the task in 80% opportunities over 3 sessions    Baseline  08-07-19    Time  3    Period  Weeks    Status  On-going       SLP Long Term Goals - 08/07/19 [place] #1   Title  pt will demo anticipatory awareness by double checking work 100% of the time in therapy sessions, over 3 sessions    Time  7    Period  Weeks   or 17 sessions, for all LTGs   Status  On-going      SLP LONG TERM GOAL #2   Title  pt will demo divided attention with a mod complex task and a simple linguistic task over three sessions    Time  7    Period  Weeks    Status  On-going      SLP LONG TERM GOAL #3   Title  in complex functional tasks, pt will demo appropriate organization, awareness, and attention to detail for 100% accuracy over 5 sessions    Time  7    Period  Weeks    Status  On-going       Plan - 08/07/19 1136    Clinical Impression Statement  Pt  exhibited better  ability in using compensatory measures today than in previous appointmets. Pt would benefit from skilled ST to target these higher-level areas of cognitive communicaiton.    Speech Therapy Frequency  2x / week    Duration  --   8 weeks or 17 sessions   Treatment/Interventions  SLP instruction and feedback;Compensatory strategies;Patient/family education;Internal/external aids;Cognitive reorganization;Functional tasks    Potential to Achieve Goals  Good    Consulted and Agree with Plan of Care  Patient       Patient will benefit from skilled therapeutic intervention in order to improve the following deficits and impairments:   Cognitive communication deficit    Problem List Patient Active Problem List   Diagnosis Date Noted  . ICH (intracerebral hemorrhage) ([facility]) 07/14/2019  . Hemorrhagic stroke ([place]) 07/14/2019  . Acute respiratory failure ([place])   . HYPERLIPIDEMIA 06/11/2007  . HYPERTENSION 06/11/2007  . ALLERGIC RHINITIS 06/11/2007    [facility] ,MS, CCC-SLP  08/07/2019, 11:38 AM  [facility] [address] [place], [place], [address] Phone: [phone]   Fax:  [phone]   Name: RHONDA LEDSOME MRN: [id number] Date of Birth: 1951/01/01

## 2019-08-07 NOTE — Patient Instructions (Signed)
  Please complete the assigned speech therapy homework prior to your next session and return it to the speech therapist at your next visit.  

## 2019-08-13 ENCOUNTER — Ambulatory Visit: Payer: Medicare Other

## 2019-08-13 ENCOUNTER — Encounter: Payer: Self-pay | Admitting: Physical Therapy

## 2019-08-13 ENCOUNTER — Encounter: Payer: Medicare Other | Admitting: Occupational Therapy

## 2019-08-13 ENCOUNTER — Other Ambulatory Visit: Payer: Self-pay

## 2019-08-13 ENCOUNTER — Ambulatory Visit: Payer: Medicare Other | Admitting: Physical Therapy

## 2019-08-13 DIAGNOSIS — R41840 Attention and concentration deficit: Secondary | ICD-10-CM | POA: Diagnosis not present

## 2019-08-13 DIAGNOSIS — R2681 Unsteadiness on feet: Secondary | ICD-10-CM | POA: Diagnosis not present

## 2019-08-13 DIAGNOSIS — R41841 Cognitive communication deficit: Secondary | ICD-10-CM | POA: Diagnosis not present

## 2019-08-13 DIAGNOSIS — R2689 Other abnormalities of gait and mobility: Secondary | ICD-10-CM | POA: Diagnosis not present

## 2019-08-13 NOTE — Therapy (Signed)
[facility] [address] [place], [place], [address] Phone: [phone]   Fax:  [phone]  Speech Language Pathology Treatment  Patient Details  Name: Thomas Norman MRN: [id number] Date of Birth: 04-Sep-1951 Referring Provider (SLP): Dia Crawford (3W); notes sent to PCP- Gaynelle Arabian, MD   Encounter Date: 08/13/2019  End of Session - 08/13/19 1143    Visit Number  5    Number of Visits  17    Date for SLP Re-Evaluation  10/21/19    SLP Start Time  1105    SLP Stop Time   1135    SLP Time Calculation (min)  30 min    Activity Tolerance  Patient tolerated treatment well       Past Medical History:  Diagnosis Date  . Hypertension   . Kidney calculi     Past Surgical History:  Procedure Laterality Date  . LITHOTRIPSY    . skin cancers    . TONSILLECTOMY      There were no vitals filed for this visit.  Subjective Assessment - 08/13/19 1113    Patient is accompained by:  Family member   [name] - wife   Currently in Pain?  No/denies            ADULT SLP TREATMENT - 08/13/19 1113      General Information   Behavior/Cognition  Alert;Cooperative;Pleasant mood      Cognitive-Linquistic Treatment   Treatment focused on  Cognition    Skilled Treatment  "Im' a little bit more conscious of it - I push myself to remember. Before, I passed over it." Wife states pt using more compensations than prior to CVA. Pt homework was completed 100% success and complete. 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 Pt pre-planned for this task Friday last week. Pt showing anticipatory awareness about COVID-19 as well as "getting back to everything" and asking neurologist about what he can and can't do.       Assessment / Recommendations / Plan   Plan  Continue with current plan of care   decr to once/week due to  progress     Progression Toward Goals   Progression toward goals  Progressing toward goals         SLP Short Term Goals - 08/13/19 1131      SLP SHORT TERM GOAL #1   Title  pt will complete cognitive testing in first 1-2 sessions    Status  Achieved      SLP SHORT TERM GOAL #2   Title  pt will name 3 non-physical deficit areas    Time  --    Period  --    Status  Partially Met      SLP SHORT TERM GOAL #3   Title  pt will demo emergent awareness of errors on therapy tasks with rare min A in 3 sessions    Time  --    Period  --    Status  Partially Met      SLP SHORT TERM GOAL #4   Title  pt will demo alternating attention in two simple cognitive-linguistic therapy tasks over three sessions    Time  --    Period  --    Status  Partially Met      SLP SHORT TERM GOAL #5   Title  pt will  demonstrate anticipatory awareness for novel tasks by taking time to process requirements and how to organize the task in 80% opportunities over 3 sessions    Baseline  08-07-19, 08-13-19    Time  --    Period  Weeks    Status  Partially Met       SLP Long Term Goals - 08/13/19 1129      SLP [place] #1   Title  pt will demo anticipatory awareness by double checking work 100% of the time in therapy sessions, over 3 sessions    Baseline  08-07-19-08-13-19    Time  6    Status  Partially Met      SLP LONG TERM GOAL #2   Title  pt will demo divided attention with a mod complex task and a simple linguistic task over three sessions    Time  6    Status  On-going      SLP LONG TERM GOAL #3   Title  in complex functional tasks, pt will demo appropriate organization, awareness, and attention to detail for 100% accuracy over 5 sessions    Baseline  08-07-19,08-13-19    Time  7    Period  Weeks    Status  On-going   2/5 sessions      Plan - 08/13/19 1143    Clinical Impression Statement  Pt's wife and pt agree in last two weeks pt's cognition has returned at, if not very near  to, baseline. Pt and wife explained pt attention at alternating nad divided levels. Pt exhibited consistent atinicapatory awareness today both in explanation and in written tasks today. Pt would benefit from one more session of skilled ST to ensure consistency in success over time. Pt will meet with SLP next in approx 2 weeks.    Speech Therapy Frequency  1x /week    Duration  --   8 weeks or 17 sessions   Treatment/Interventions  SLP instruction and feedback;Compensatory strategies;Patient/family education;Internal/external aids;Cognitive reorganization;Functional tasks    Potential to Achieve Goals  Good    Consulted and Agree with Plan of Care  Patient       Patient will benefit from skilled therapeutic intervention in order to improve the following deficits and impairments:   Cognitive communication deficit    Problem List Patient Active Problem List   Diagnosis Date Noted  . ICH (intracerebral hemorrhage) ([facility]) 07/14/2019  . Hemorrhagic stroke ([place] [facility]) 07/14/2019  . Acute respiratory failure ([place])   . HYPERLIPIDEMIA 06/11/2007  . HYPERTENSION 06/11/2007  . ALLERGIC RHINITIS 06/11/2007    [facility] ,MS, CCC-SLP  08/13/2019, 11:48 AM  [facility] [address] [place], [place], [address] Phone: [phone]   Fax:  [phone]   Name: JAKHAI FANT MRN: [id number] Date of Birth: [birth year]

## 2019-08-13 NOTE — Therapy (Signed)
[facility] [address] [place], [place], [address] Phone: [phone]   Fax:  [phone]  Physical Therapy Treatment/ DC Summary  Patient Details  Name: Thomas Norman MRN: [id number] Date of Birth: Nov 12, 1950 Referring Provider (PT): Dia Crawford, MD Hospitalist, Will be following up with Frann Rider at [facility]   Encounter Date: 08/13/2019  PT End of Session - 08/13/19 1259    Visit Number  4    Number of Visits  6    Date for PT Re-Evaluation  09/20/19    Authorization Type  Medicare    PT Start Time  1017    PT Stop Time  1102    PT Time Calculation (min)  45 min    Equipment Utilized During Treatment  Gait belt    Activity Tolerance  Patient tolerated treatment well    Behavior During Therapy  [facility] for tasks assessed/performed       Past Medical History:  Diagnosis Date  . Hypertension   . Kidney calculi     Past Surgical History:  Procedure Laterality Date  . LITHOTRIPSY    . skin cancers    . TONSILLECTOMY      There were no vitals filed for this visit.  Subjective Assessment - 08/13/19 1020    Subjective  No new complaints. No dizziness reported. States that the exercises are getting easy.    Patient is accompained by:  Family member   [name], wife   Pertinent History  HTN, HLD    Diagnostic tests  CT/MRI showed small L parietal hemorrhage.    Patient Stated Goals  wants to work as hard as he can, wants to improve his balance and be better than he has been before    Currently in Pain?  No/denies         [facility] PT Assessment - 08/13/19 1029      Functional Gait  Assessment   Gait assessed   Yes    Gait Level Surface  Walks 20 ft in less than 5.5 sec, no assistive devices, good speed, no evidence for imbalance, normal gait pattern, deviates no more than 6 in outside of the 12 in walkway width.   5.06 seconds   Change in Gait Speed  Able to smoothly change walking speed without loss of  balance or gait deviation. Deviate no more than 6 in outside of the 12 in walkway width.    Gait with Horizontal Head Turns  Performs head turns smoothly with no change in gait. Deviates no more than 6 in outside 12 in walkway width    Gait with Vertical Head Turns  Performs head turns with no change in gait. Deviates no more than 6 in outside 12 in walkway width.    Gait and Pivot Turn  Pivot turns safely within 3 sec and stops quickly with no loss of balance.    Step Over Obstacle  Is able to step over 2 stacked shoe boxes taped together (9 in total height) without changing gait speed. No evidence of imbalance.    Gait with Narrow Base of Support  Is able to ambulate for 10 steps heel to toe with no staggering.    Gait with Eyes Closed  Walks 20 ft, no assistive devices, good speed, no evidence of imbalance, normal gait pattern, deviates no more than 6 in outside 12 in walkway width. Ambulates 20 ft in less than 7 sec.   6.47 seconds    Ambulating Backwards  Walks 20 ft, no assistive devices, good speed, no evidence for imbalance, normal gait    Steps  Alternating feet, no rail.    Total Score  30    FGA comment:  30/30                   [facility] Adult PT Treatment/Exercise - 08/13/19 1029      Ambulation/Gait   Ambulation/Gait  Yes    Ambulation/Gait Assistance  7: Independent    Ambulation Distance (Feet)  500 Feet    Assistive device  None    Gait Pattern  Step-through pattern;Within Functional Limits    Ambulation Surface  Unlevel;Outdoor;Paved;Grass    Gait velocity  7.85 seconds = 4.19 ft/sec    Gait Comments  Performed outdoors on unlevel surfaces and grassy inclines asking pt to scan environment and perform vertical/horizontal head turns. Pt with no LOB      Self-Care   Self-Care  Other Self-Care Comments    Other Self-Care Comments   Discussed with pt and pt's wife the warning signs and symptoms of a stroke as well as risk factors. Pt able to verbalize understanding.  Also provided pt and his wife with handout of 'BE FAST' to have symptoms written down for any further review        Access Code: [email or web address]  URL: [email or web address]  Date: 08/13/2019  Prepared by: Janann August   Finalized pt's HEP   Exercises Romberg Stance on Foam Pad - 10 reps - 2 sets - 2x daily - 7x weekly  Single Leg Stance on Pillow - 3 sets - 15-20 hold - 2x daily - 7x weekly - upgraded, hold 20 seconds  Standing Romberg to 3/4 Tandem Stance - 10 reps - 2 sets - 1x daily - 7x weekly - with eyes closed, performing 10 head nods and 10 head turns  Tandem Stance - 3 sets - 30 hold - 2x daily - 7x weekly Tandem Walking with Counter Support - 2 sets - 2x daily - 7x weekly Heel Walking - 2 sets - 7x weekly      PT Education - 08/13/19 1314    Education Details  see self care, achievement of LTGs, final HEP    Person(s) Educated  Patient;Spouse    Methods  Explanation;Demonstration;Handout    Comprehension  Verbalized understanding;Returned demonstration       PT Short Term Goals - 07/22/19 1422      PT SHORT TERM GOAL #1   Title  ALL STGS = LTGS        PT Long Term Goals - 08/13/19 1028      PT LONG TERM GOAL #1   Title  Patient will be independent with final HEP in order to build upon functional gains made in therapy. ALL LTGS DUE 09/02/19    Baseline  achieved on 08/13/19    Time  6    Period  Weeks    Status  Achieved      PT LONG TERM GOAL #2   Title  Patient will improve his FGA score to at least a 28/30 in order to demonstrate decreased fall risk.    Baseline  30/30 on 08/13/19    Time  6    Period  Weeks    Status  Achieved      PT LONG TERM GOAL #3   Title  Patient will ambulate at least 500' outdoors on unlevel surfaces while scanning environment with mod I in order to improve community  mobility and get back to working in the yard.    Baseline  met on 08/13/19    Time  6    Period  Weeks    Status  Achieved      PT LONG TERM GOAL  #4   Title  Pt and wife will verbalize understanding of the signs and symptoms of a stroke as well as risk factors.    Baseline  met on 08/13/19    Time  6    Period  Weeks    Status  Achieved      PT LONG TERM GOAL #5   Title  Pt will report 0/10 dizziness after performing turns during gait.    Baseline  no dizziness today on 08/13/19    Time  6    Period  Weeks    Status  Achieved      PT LONG TERM GOAL #6   Title  Patient will improve gait speed to at least 3.8 ft/sec in order demonstrate improve mobility.    Baseline  4.19 ft/sec on 08/13/19    Time  6    Period  Weeks    Status  Achieved        PHYSICAL THERAPY DISCHARGE SUMMARY  Visits from Start of Care: 4  Current functional level related to goals / functional outcomes: See LTGs.    Remaining deficits: Impaired SLS and tandem stance with eyes closed on compliant surfaces.    Education / Equipment: Final HEP   Plan: Patient agrees to discharge.  Patient goals were met. Patient is being discharged due to meeting the stated rehab goals.  ?????            Plan - 08/13/19 1445    Clinical Impression Statement  focus of today's session was assessing pt's LTGs. Pt has met all of his LTGs. He has improved his FGA score to a 30/30 (previously 21/30) and has improved his gait speed to 4.19 ft/sec (normal walking speed is 4.37 ft/sec). Pt is very pleased with his progress made in PT and is agreeable to D/C. Remainder of session focused on reviewing corner balance HEP and making upgrades - pt able to perform all exercises correctly.    Personal Factors and Comorbidities  Comorbidity 2;Past/Current Experience    Comorbidities  HTN, HLD    Examination-Activity Limitations  Locomotion Level;Bathing   bathing - performing SLS in shower   Examination-Participation Restrictions  Yard Work    Stability/Clinical Decision Making  Stable/Uncomplicated    Rehab Potential  Excellent    PT Frequency  1x / week    PT Duration   --   5 weeks   PT Treatment/Interventions  ADLs/Self Care Home Management;Gait training;Functional mobility training;Therapeutic activities;Neuromuscular re-education;Balance training;Therapeutic exercise;Patient/family education;Vestibular    PT Next Visit Plan  D/C.    PT Home Exercise Plan  [email or web address]    Consulted and Agree with Plan of Care  Family member/caregiver    Family Member Consulted  pt's wife [name]       Patient will benefit from skilled therapeutic intervention in order to improve the following deficits and impairments:  Abnormal gait, Decreased balance, Dizziness, Difficulty walking  Visit Diagnosis: Unsteadiness on feet  Other abnormalities of gait and mobility     Problem List Patient Active Problem List   Diagnosis Date Noted  . ICH (intracerebral hemorrhage) ([place]) 07/14/2019  . Hemorrhagic stroke ([place]) 07/14/2019  . Acute respiratory failure ([place])   . HYPERLIPIDEMIA 06/11/2007  . HYPERTENSION  06/11/2007  . ALLERGIC RHINITIS 06/11/2007    Arliss Journey, PT, DPT  08/13/2019, 2:47 PM  [facility] [address] [place], [place], [address] Phone: [phone]   Fax:  [phone]  Name: ZURI BRADWAY MRN: [id number] Date of Birth: 1951-07-10

## 2019-08-13 NOTE — Patient Instructions (Signed)
Warning Signs of a Stroke  A stroke is a medical emergency and should be treated right away-every second counts. A stroke is caused by a decrease or block in blood flow to the brain. When this occurs, certain areas of the brain do not get enough oxygen, and brain cells begin to die. A stroke can lead to brain damage and can sometimes be life-threatening. However, if someone having a stroke gets medical treatment right away, he or she has better chances of surviving and recovering from the stroke. Being able to recognize the symptoms of a stroke is very important. Types of strokes There are two main types of strokes:  Ischemic strokes. This is the most common type of stroke. These strokes happen when a blood vessel that supplies blood to the brain is being blocked.  Hemorrhagic strokes. These strokes result from bleeding in the brain due to a blood vessel leaking or bursting (rupturing). A transient ischemic attack (TIA) is a "warning stroke" that causes stroke-like symptoms that go away quickly. Unlike a stroke, a TIA does not cause permanent damage to the brain. However, the symptoms of a TIA are the same as a stroke, and they also require medical treatment right away. Having a TIA is a sign that you are at higher risk for a permanent stroke. Warning signs of a stroke The symptoms of stroke may vary and will reflect the part of the brain that is involved. Symptoms usually happen suddenly. "BE FAST" is an easy way to remember the main warning signs of a stroke. B - Balance Signs are dizziness, sudden trouble walking, or loss of balance. E - Eyes Signs are trouble seeing or a sudden change in vision. F - Face Signs are sudden weakness or numbness of the face, or the face or eyelid drooping on one side. A - Arms Signs are weakness or numbness in an arm. This happens suddenly and usually on one side of the body. S - Speech Signs are sudden trouble speaking, slurred speech, or trouble understanding  what people say. T - Time Time to call emergency services. Write down what time symptoms started. Other signs of a stroke Some less common signs of a stroke include:  A sudden, severe headache with no known cause.  Nausea or vomiting.  Seizure. A stroke may be happening even if only one "BE FAST" symptoms is present. These symptoms may represent a serious problem that is an emergency. Do not wait to see if the symptoms will go away. Get medical help right away. Call your local emergency services (911 in the [place] Do not drive yourself to the hospital. Summary  A stroke is a medical emergency and should be treated right away-every second counts.  "BE FAST" is an easy way to remember the main warning signs of a stroke.  Call local emergency services right away if you or someone else has any stroke symptoms, even if the symptoms go away.  Make note of what time the first symptoms appeared. Emergency responders or emergency room staff will need to know this information.  Do not wait to see if symptoms will go away. Call 911 even if only one of the "BE FAST" symptoms appears. This information is not intended to replace advice given to you by your health care provider. Make sure you discuss any questions you have with your health care provider. Document Released: 01/27/2017 Document Revised: 09/22/2017 Document Reviewed: 01/27/2017 Elsevier Patient Education  2020 [employer] Inc[employer]  

## 2019-08-15 ENCOUNTER — Ambulatory Visit: Payer: Medicare Other

## 2019-08-19 ENCOUNTER — Ambulatory Visit (INDEPENDENT_AMBULATORY_CARE_PROVIDER_SITE_OTHER): Payer: Medicare Other | Admitting: Adult Health

## 2019-08-19 ENCOUNTER — Other Ambulatory Visit: Payer: Self-pay

## 2019-08-19 ENCOUNTER — Encounter: Payer: Self-pay | Admitting: Adult Health

## 2019-08-19 ENCOUNTER — Telehealth: Payer: Self-pay | Admitting: Adult Health

## 2019-08-19 VITALS — BP 116/70 | HR 62 | Temp 97.4°F | Ht 72.0 in | Wt 161.4 lb

## 2019-08-19 DIAGNOSIS — R569 Unspecified convulsions: Secondary | ICD-10-CM

## 2019-08-19 DIAGNOSIS — I10 Essential (primary) hypertension: Secondary | ICD-10-CM | POA: Diagnosis not present

## 2019-08-19 DIAGNOSIS — I611 Nontraumatic intracerebral hemorrhage in hemisphere, cortical: Secondary | ICD-10-CM

## 2019-08-19 DIAGNOSIS — E785 Hyperlipidemia, unspecified: Secondary | ICD-10-CM

## 2019-08-19 DIAGNOSIS — I609 Nontraumatic subarachnoid hemorrhage, unspecified: Secondary | ICD-10-CM

## 2019-08-19 NOTE — Patient Instructions (Addendum)
You will be called to schedule your MRI w/wo contrast to assess for any underlying causes of your recent bleed  Avoid aspirin, aspirin-containing products and ibuprofen products  Continue Keppra for seizure prevention and you will be scheduled for an EEG  Continue to follow up with PCP regarding cholesterol and blood pressure management   Continue to monitor blood pressure at home  Maintain strict control of hypertension with blood pressure goal below 130/90, diabetes with hemoglobin A1c goal below 6.5% and cholesterol with LDL cholesterol (bad cholesterol) goal below 70 mg/dL. I also advised the patient to eat a healthy diet with plenty of whole grains, cereals, fruits and vegetables, exercise regularly and maintain ideal body weight.  Followup in the future with me in 2 months or call earlier if needed       Thank you for coming to see [place] at [facility]. I hope we have been able to provide you high quality care today.  You may receive a patient satisfaction survey over the next few weeks. We would appreciate your feedback and comments so that we may continue to improve ourselves and the health of our patients.

## 2019-08-19 NOTE — Telephone Encounter (Signed)
Medicare/bcbs supp order sent to GI. No auth they will reach out to the patient to schedule.  

## 2019-08-19 NOTE — Progress Notes (Signed)
[facility] [address] [place]. [place] [address] [phone]       [place]  Mr. DECATUR SEGREST Date of Birth:  [birth year] Medical Record Number:  [id number]   Reason for Referral:  hospital stroke follow up    CHIEF COMPLAINT:  Chief Complaint  Patient presents with  . Hospitalization Follow-up    Wife present. Rm 9. No new concerns at this time.     HPI: Ming Youngers Burgessis being seen today for in office hospital follow-up regarding left parietal ICH on 07/13/2019.  History obtained from patient, wife and chart review. Reviewed all radiology images and labs personally.  Mr. MANSUR SANDUSKY is a 68 y.o. male with history of HTN and HLD  presented on 07/13/2019 with aphasia and confusion.  Witnessed tonic-clonic seizure activity in ED lobby.    Stroke work-up revealed small acute left parietal ICH as evidenced on CT head and MRI likely related to hypertension.  He did not receive IV t-PA due to [place].  MRI did not show evidence of underlying mass lesion, AVM or CAA but did show mild scattered subarachnoid hemorrhage seen within the adjacent left frontal-occipital region.  CTA head/neck unremarkable.  LDL 95.  A1c 5.2.  Witnessed tonic-clonic seizure while in ER with EEG showing evidence of epileptogenicity in left frontal area along with severe diffuse encephalopathy likely secondary to sedated state during EEG.  Elevated blood pressure post seizure activity but otherwise stable during admission.  Loaded with Keppra and recommended continuation at discharge for seizure prophylaxis.  HTN and HLD stable.  Other stroke risk factors include advanced age and EtOH use but no prior history of stroke.  Discharged home in stable condition recommendation of outpatient PT/OT.  Mr. Goodlow is a 68 year old male who is being seen today for stroke follow-up accompanied by his wife.  He has been doing well since discharge with complete resolution of residual deficits.  He  was initially participating in outpatient PT/ST and has since been discharged.  He has not returned back to prior activities as he was not sure of any restrictions and fear of reoccurring stroke or worsening of bleed.  He does endorse roughly 10 days prior to symptom onset, he was attempting to move a loaded trailer but his hand slipped off the jack and fell backwards "with considerable force" hitting the back portion of his head.  He denies loss of consciousness but reports "worst head "I ever remember having".  He did experience pain for approximately 30 minutes but then resolved and did not experience any headache, vision loss, weakness, AMS or speech difficulty until he presented to the ED.  Blood pressure has always been well managed typically ranging 110-120/70s.  Blood pressure today 116/70.  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  He has remained on Keppra 500 mg twice daily without side effects or reoccurring seizure activity.  He does have a history of easy bruising noticeably greater distal extremities upper>lower.  He ensures avoidance of aspirin, aspirin-containing products or ibuprofen products. no further concerns at this time.     ROS:   14 system review of systems performed and negative with exception of see HPI  PMH:  Past Medical History:  Diagnosis Date  . Hypertension   . Kidney calculi     PSH:  Past Surgical History:  Procedure Laterality Date  . LITHOTRIPSY    . skin cancers    .  TONSILLECTOMY      Social History:  Social History   Socioeconomic History  . Marital status: Married    Spouse name: Not on file  . Number of children: Not on file  . Years of education: Not on file  . Highest education level: Not on file  Occupational History  . Not on file  Social Needs  . Financial resource strain: Not on file  . Food insecurity    Worry: Not on file    Inability: Not on file   . Transportation needs    Medical: Not on file    Non-medical: Not on file  Tobacco Use  . Smoking status: Never Smoker  . Smokeless tobacco: Never Used  Substance and Sexual Activity  . Alcohol use: Yes    Alcohol/week: 1.0 standard drinks    Types: 1 Glasses of wine per week  . Drug use: No  . Sexual activity: Yes  Lifestyle  . Physical activity    Days per week: Not on file    Minutes per session: Not on file  . Stress: Not on file  Relationships  . Social Herbalist on phone: Not on file    Gets together: Not on file    Attends religious service: Not on file    Active member of club or organization: Not on file    Attends meetings of clubs or organizations: Not on file    Relationship status: Not on file  . Intimate partner violence    Fear of current or ex partner: Not on file    Emotionally abused: Not on file    Physically abused: Not on file    Forced sexual activity: Not on file  Other Topics Concern  . Not on file  Social History Narrative  . Not on file    Family History:  Family History  Problem Relation Age of Onset  . Diabetes Mother   . Dementia Mother     Medications:   Current Outpatient Medications on File Prior to Visit  Medication Sig Dispense Refill  . amLODipine (NORVASC) 2.5 MG tablet Take 1 tablet (2.5 mg total) by mouth daily. 30 tablet 11  . Coenzyme Q10 (CO Q 10 PO) Take 1 tablet by mouth daily.    . fish oil-omega-3 fatty acids 1000 MG capsule Take 1 g by mouth daily.    Marland Kitchen levETIRAcetam (KEPPRA) 500 MG tablet Take 1 tablet (500 mg total) by mouth 2 (two) times daily. 60 tablet 3  . metoprolol tartrate (LOPRESSOR) 25 MG tablet Take 1 tablet (25 mg total) by mouth 2 (two) times daily. 60 tablet 11  . telmisartan (MICARDIS) 80 MG tablet Take 80 mg by mouth daily.    Marland Kitchen acetaminophen (TYLENOL) 325 MG tablet Take 2 tablets (650 mg total) by mouth every 6 (six) hours as needed for mild pain, moderate pain or fever (Temp > 100.5 F).  (Patient not taking: Reported on 07/22/2019)    . albuterol (PROVENTIL) (2.5 MG/3ML) 0.083% nebulizer solution Take 3 mLs (2.5 mg total) by nebulization every 2 (two) hours as needed for wheezing. (Patient not taking: Reported on 07/22/2019) 75 mL 12   No current facility-administered medications on file prior to visit.     Allergies:   Allergies  Allergen Reactions  . Sulfonamide Derivatives Rash     Physical Exam  Vitals:   08/19/19 1306  BP: 116/70  Pulse: 62  Temp: (!) 97.4 F (36.3 C)  TempSrc: Oral  Weight: 161 lb 6.4 oz (73.2 kg)  Height: 6' (1.829 m)   Body mass index is 21.89 kg/m. No exam data present  Depression screen [facility] 2/9 08/19/2019  Decreased Interest 0  Down, Depressed, Hopeless 0  PHQ - 2 Score 0     General: well developed, well nourished,  pleasant middle-age Caucasian male, seated, in no evident distress Head: head normocephalic and atraumatic.   Neck: supple with no carotid or supraclavicular bruits Cardiovascular: regular rate and rhythm, no murmurs Musculoskeletal: no deformity Skin:  no rash/petichiae Vascular:  Normal pulses all extremities   Neurologic Exam Mental Status: Awake and fully alert. Oriented to place and time. Recent and remote memory intact. Attention span, concentration and fund of knowledge appropriate. Mood and affect appropriate.  Cranial Nerves: Fundoscopic exam reveals sharp disc margins. Pupils equal, briskly reactive to light. Extraocular movements full without nystagmus. Visual fields full to confrontation. Hearing intact. Facial sensation intact. Face, tongue, palate moves normally and symmetrically.  Motor: Normal bulk and tone. Normal strength in all tested extremity muscles. Sensory.: intact to touch , pinprick , position and vibratory sensation.  Coordination: Rapid alternating movements normal in all extremities. Finger-to-nose and heel-to-shin performed accurately bilaterally. Gait and Station: Arises from chair  without difficulty. Stance is normal. Gait demonstrates normal stride length and balance Reflexes: 1+ and symmetric. Toes downgoing.     NIHSS  0 Modified Rankin  0    Diagnostic Data (Labs, Imaging, Testing)  Ct Head Code Stroke Wo Contrast 07/14/2019 IMPRESSION: 2.6 cm acute left parietal parenchymal hemorrhage. Mild edema without mass effect.  Ct Angio Head W Or Wo Contrast Ct Angio Neck W Or Wo Contrast 07/14/2019 IMPRESSION:  No large vessel occlusion, stenosis, or significant atherosclerosis in the head and neck.   Ct Head Wo Contrast 07/15/2019 IMPRESSION:  1. Unchanged size of posterior left parietal hemorrhage with surrounding edema.  2. Possible tiny hemorrhage in the dependent left lateral ventricle. No other change from prior exam.   Mr [name] Wo Contrast 07/15/2019 IMPRESSION:  1. Stable size and morphology of intra-axial hemorrhage centered at the left parietal convexity. Mild localized edema without significant regional mass effect. No underlying mass lesion, abnormal enhancement, or other structural abnormality identified.  2. No other acute intracranial process.  3. Underlying mild to moderate chronic microvascular ischemic disease.   ECG - SB rate 59 BPM. (See cardiology reading for complete details)  EEG 07/14/19 IMPRESSION: This studyshowed evidence of epileptogenicity in left frontal area. There was also severe diffuse encephalopathy, which could be secondary to sedated state.No seizures were seen throughout the recording. The excessive beta activity seen in the background is most likely due to the effect ofmedications likebenzodiazepine and is a benign EEG pattern.     ASSESSMENT: DUILIO STETLER is a 68 y.o. year old male presented with aphasia, confusion, agitation and elevated BP (post ictal)  on 07/13/2019 with stroke work-up revealing small acute left parietal ICH and mild scattered subarachnoid hemorrhage left parietal-occipital region  with subsequent tonic-clonic seizure.  Reviewed imaging with Dr. Leta Baptist for possible traumatic hemorrhage with recent fall versus underlying etiology.  Vascular risk factors include HTN and HLD.     PLAN:  1. Left parietal ICH/parietal-occipital SAH: Repeat MRI w/wo contrast to assess for underlying etiology of ICH is felt less likely secondary to HTN (due to location, appearance and no history of uncontrolled HTN) and potential etiology traumatic versus tumor/AVM.  Advised to avoid aspirin, aspirin-containing products and ibuprofen products.  Continue  fish oil for secondary stroke prevention. Maintain strict control of hypertension with blood pressure goal below 130/90, diabetes with hemoglobin A1c goal below 6.5% and cholesterol with LDL cholesterol (bad cholesterol) goal below 70 mg/dL.  I also advised the patient to eat a healthy diet with plenty of whole grains, cereals, fruits and vegetables, exercise regularly with at least 30 minutes of continuous activity daily and maintain ideal body weight.  Advised patient to slowly increase activity as tolerated and to limit stressors or activities that could increase blood pressure such as bearing down or lifting heavy objects 2. Seizure, post ICH: Continuation of Keppra 500 mg twice daily.  Repeat EEG.  Discussion regarding potential recurrent seizure activity if medication were to be stopped.  He is aware of [place] law regarding no driving for 6 months post seizure activity. 3. HTN: Advised to continue current treatment regimen.  Today's BP stable.  Advised to continue to monitor at home along with continued follow-up with PCP for management 4. HLD: Advised to continue current treatment regimen along with continued follow-up with PCP for future prescribing and monitoring of lipid panel   Follow up in 2 months or call earlier if needed   Greater than 50% of time during this prolonged 60 minute visit was spent on counseling, explanation of diagnosis of  left parietal ICH with S AH and potential etiologies, reviewing risk factor management of HTN, HLD, discussion regarding activity and restrictions, discussion regarding seizures post ICH, planning of further management along with potential future management, and discussion with patient and family answering all questions.    Frann Rider, AGNP-BC  [facility] [address] [place], [place] [address]  Phone [phone] Fax [phone] Note: This document was prepared with digital dictation and possible smart phrase technology. Any transcriptional errors that result from this process are unintentional.

## 2019-08-20 ENCOUNTER — Encounter: Payer: Medicare Other | Admitting: Occupational Therapy

## 2019-08-20 ENCOUNTER — Ambulatory Visit: Payer: Medicare Other | Admitting: Physical Therapy

## 2019-08-20 NOTE — Progress Notes (Signed)
I agree with the above plan 

## 2019-08-26 ENCOUNTER — Ambulatory Visit: Payer: Medicare Other | Admitting: Physical Therapy

## 2019-08-26 ENCOUNTER — Encounter: Payer: Medicare Other | Admitting: Occupational Therapy

## 2019-08-28 ENCOUNTER — Ambulatory Visit: Payer: Medicare Other

## 2019-09-02 ENCOUNTER — Ambulatory Visit: Payer: Medicare Other | Admitting: Physical Therapy

## 2019-09-02 ENCOUNTER — Ambulatory Visit: Payer: Medicare Other

## 2019-09-03 DIAGNOSIS — Z23 Encounter for immunization: Secondary | ICD-10-CM | POA: Diagnosis not present

## 2019-09-03 DIAGNOSIS — I619 Nontraumatic intracerebral hemorrhage, unspecified: Secondary | ICD-10-CM | POA: Diagnosis not present

## 2019-09-03 DIAGNOSIS — I611 Nontraumatic intracerebral hemorrhage in hemisphere, cortical: Secondary | ICD-10-CM | POA: Diagnosis not present

## 2019-09-04 ENCOUNTER — Ambulatory Visit: Payer: Medicare Other

## 2019-09-07 ENCOUNTER — Ambulatory Visit
Admission: RE | Admit: 2019-09-07 | Discharge: 2019-09-07 | Disposition: A | Discharge status: Home / Self Care | Payer: Medicare Other | Source: Ambulatory Visit | Attending: Adult Health | Admitting: Adult Health

## 2019-09-07 ENCOUNTER — Other Ambulatory Visit: Payer: Self-pay

## 2019-09-07 DIAGNOSIS — I611 Nontraumatic intracerebral hemorrhage in hemisphere, cortical: Secondary | ICD-10-CM | POA: Diagnosis not present

## 2019-09-07 MED ORDER — GADOBENATE DIMEGLUMINE 529 MG/ML IV SOLN
14.0000 mL | Freq: Once | INTRAVENOUS | Status: AC | PRN
  Administered 2019-09-07: 14 mL via INTRAVENOUS

## 2019-09-08 DIAGNOSIS — Z20828 Contact with and (suspected) exposure to other viral communicable diseases: Secondary | ICD-10-CM | POA: Diagnosis not present

## 2019-09-09 ENCOUNTER — Ambulatory Visit (INDEPENDENT_AMBULATORY_CARE_PROVIDER_SITE_OTHER): Payer: Medicare Other | Admitting: Neurology

## 2019-09-09 ENCOUNTER — Other Ambulatory Visit: Payer: Self-pay

## 2019-09-09 ENCOUNTER — Telehealth: Payer: Self-pay | Admitting: *Deleted

## 2019-09-09 DIAGNOSIS — R569 Unspecified convulsions: Secondary | ICD-10-CM | POA: Diagnosis not present

## 2019-09-09 NOTE — Telephone Encounter (Signed)
Spoke with patient and informed him that his recent MRI showed resolution of prior bleed without apparent evidence of any abnormalities potentially causing his bleed. He is currently being evaluated by [facility] neurology undergoing cerebral angiogram and advised to continue to follow with them at this time. Patient verbalized understanding, appreciation.

## 2019-09-10 ENCOUNTER — Telehealth: Payer: Self-pay

## 2019-09-10 DIAGNOSIS — I619 Nontraumatic intracerebral hemorrhage, unspecified: Secondary | ICD-10-CM | POA: Diagnosis not present

## 2019-09-10 DIAGNOSIS — E785 Hyperlipidemia, unspecified: Secondary | ICD-10-CM | POA: Diagnosis not present

## 2019-09-10 DIAGNOSIS — I618 Other nontraumatic intracerebral hemorrhage: Secondary | ICD-10-CM | POA: Diagnosis not present

## 2019-09-10 DIAGNOSIS — R402412 Glasgow coma scale score 13-15, at arrival to emergency department: Secondary | ICD-10-CM | POA: Diagnosis not present

## 2019-09-10 DIAGNOSIS — I10 Essential (primary) hypertension: Secondary | ICD-10-CM | POA: Diagnosis not present

## 2019-09-10 NOTE — Telephone Encounter (Signed)
Called to inform pt of his recent EEG test results per NP Frann Rider.  NALVM.   - "Notes recorded by Frann Rider, NP on 09/10/2019 at 12:36 PM EST  Please advise patient that his recent EEG did not show any abnormalities. Please continue current regimen" -Frann Rider NP

## 2019-10-01 DIAGNOSIS — I611 Nontraumatic intracerebral hemorrhage in hemisphere, cortical: Secondary | ICD-10-CM | POA: Diagnosis not present

## 2019-10-08 ENCOUNTER — Encounter: Payer: Self-pay | Admitting: Adult Health

## 2019-10-09 ENCOUNTER — Encounter: Payer: Self-pay | Admitting: Adult Health

## 2019-10-09 ENCOUNTER — Other Ambulatory Visit: Payer: Self-pay

## 2019-10-09 ENCOUNTER — Ambulatory Visit (INDEPENDENT_AMBULATORY_CARE_PROVIDER_SITE_OTHER): Payer: Medicare Other | Admitting: Adult Health

## 2019-10-09 VITALS — BP 135/77 | HR 57 | Temp 97.0°F | Ht 72.0 in | Wt 161.6 lb

## 2019-10-09 DIAGNOSIS — R569 Unspecified convulsions: Secondary | ICD-10-CM

## 2019-10-09 DIAGNOSIS — I609 Nontraumatic subarachnoid hemorrhage, unspecified: Secondary | ICD-10-CM | POA: Diagnosis not present

## 2019-10-09 DIAGNOSIS — E785 Hyperlipidemia, unspecified: Secondary | ICD-10-CM | POA: Diagnosis not present

## 2019-10-09 DIAGNOSIS — I10 Essential (primary) hypertension: Secondary | ICD-10-CM

## 2019-10-09 MED ORDER — LEVETIRACETAM 500 MG PO TABS: 500.0000 mg | ORAL_TABLET | Freq: Two times a day (BID) | ORAL | 3 refills | Status: DC

## 2019-10-09 NOTE — Patient Instructions (Signed)
Your Plan:  Continue Keppra 500 mg daily to prevent seizure activity -no driving for 6 months post seizure activity per [place] law  We will repeat MRI w/wo contrast in 1 month to ensure no underlying abnormalities that could have been missed  I will also speak to Dr. Leonie Man regarding if any further imaging or studies needed to be completed at this time    Follow-up in 4 months or call earlier if needed      Thank you for coming to see [place] at [facility]. I hope we have been able to provide you high quality care today.  You may receive a patient satisfaction survey over the next few weeks. We would appreciate your feedback and comments so that we may continue to improve ourselves and the health of our patients.

## 2019-10-09 NOTE — Progress Notes (Signed)
[facility] [address] [place]. [place] [address] [phone]       Exline  Mr. NARESH TRIVINO Date of Birth:  10/23/1951 Medical Record Number:  [id number]   Reason for Referral:  hospital stroke follow up    CHIEF COMPLAINT:  Chief Complaint  Patient presents with  . Follow-up    2 mon f/u. Alone. Treatment room. No new concerns at this time.     HPI: Josedejesus Asada Burgessis being seen today for in office hospital follow-up regarding left parietal ICH on 07/13/2019.  History obtained from patient, wife and chart review. Reviewed all radiology images and labs personally.  Mr. MOTTY SPANO is a 68 y.o. male with history of HTN and HLD  presented on 07/13/2019 with aphasia and confusion.  Witnessed tonic-clonic seizure activity in ED lobby.    Stroke work-up revealed small acute left parietal ICH as evidenced on CT head and MRI likely related to hypertension.  He did not receive IV t-PA due to [place].  MRI did not show evidence of underlying mass lesion, AVM or CAA but did show mild scattered subarachnoid hemorrhage seen within the adjacent left frontal-occipital region.  CTA head/neck unremarkable.  LDL 95.  A1c 5.2.  Witnessed tonic-clonic seizure while in ER with EEG showing evidence of epileptogenicity in left frontal area along with severe diffuse encephalopathy likely secondary to sedated state during EEG.  Elevated blood pressure post seizure activity but otherwise stable during admission.  Loaded with Keppra and recommended continuation at discharge for seizure prophylaxis.  HTN and HLD stable.  Other stroke risk factors include advanced age and EtOH use but no prior history of stroke.  Discharged home in stable condition recommendation of outpatient PT/OT.  Mr. Jared is a 68 year old male who is being seen today for stroke follow-up accompanied by his wife.  He has been doing well since discharge with complete resolution of residual deficits.  He was  initially participating in outpatient PT/ST and has since been discharged.  He has not returned back to prior activities as he was not sure of any restrictions and fear of reoccurring stroke or worsening of bleed.  He does endorse roughly 10 days prior to symptom onset, he was attempting to move a loaded trailer but his hand slipped off the jack and fell backwards "with considerable force" hitting the back portion of his head.  He denies loss of consciousness but reports "worst head "I ever remember having".  He did experience pain for approximately 30 minutes but then resolved and did not experience any headache, vision loss, weakness, AMS or speech difficulty until he presented to the ED.  Blood pressure has always been well managed typically ranging 110-120/70s.  Blood pressure today 116/70.  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  He has remained on Keppra 500 mg twice daily without side effects or reoccurring seizure activity.  He does have a history of easy bruising noticeably greater distal extremities upper>lower.  He ensures avoidance of aspirin, aspirin-containing products or ibuprofen products. no further concerns at this time.  Update 10/09/2019: Mr. Makarewicz is a 68 year old male who is being seen today for stroke follow-up.  He has been doing well from a stroke standpoint without residual deficits or reoccurring/new stroke/TIA symptoms.  MRI w/wo contrast was done on 09/07/2019 which did not show any underlying abnormalities for possible cause/etiology of recent hemorrhage.  Also repeated EEG  which was normal.  He was evaluated by [facility] neurology for second opinion regarding left parietal ICH etiology.  It was recommended by Dr. Patsey Berthold to undergo catheter angiogram for further evaluation.  Angiogram unremarkable for evidence of AVMs or abnormalities.  He continues to be stable from a neurological standpoint without  residual deficits.  He continues on Keppra tolerating well without reoccurring seizure activity.  Continues to monitor blood pressure at home and typically range 100-1 29/70 to 80s.  Patient questions etiology of hemorrhage which was primarily discussed during visit.  He does endorse after his fall, he immediately returned to trying to pull his trailer using increased exertion and continued increased exertion up until he presented to ED.  He also endorses possible worsening cognition/memory as he uses a calendar to write dates are important information down typically 1 day/week but he noticed after his fall, he was writing something on his calendar daily.  He also reports upon his symptom onset of increased confusion and aphasia, he took 2x 81 mg tablets of aspirin as he believes he was having a stroke.    ROS:   14 system review of systems performed and negative with exception of see HPI  PMH:  Past Medical History:  Diagnosis Date  . Hypertension   . Kidney calculi     PSH:  Past Surgical History:  Procedure Laterality Date  . LITHOTRIPSY    . skin cancers    . TONSILLECTOMY      Social History:  Social History   Socioeconomic History  . Marital status: Married    Spouse name: Not on file  . Number of children: Not on file  . Years of education: Not on file  . Highest education level: Not on file  Occupational History  . Not on file  Tobacco Use  . Smoking status: Never Smoker  . Smokeless tobacco: Never Used  Substance and Sexual Activity  . Alcohol use: Yes    Alcohol/week: 1.0 standard drinks    Types: 1 Glasses of wine per week  . Drug use: No  . Sexual activity: Yes  Other Topics Concern  . Not on file  Social History Narrative  . Not on file   Social Determinants of Health   Financial Resource Strain:   . Difficulty of Paying Living Expenses: Not on file  Food Insecurity:   . Worried About Charity fundraiser in the Last Year: Not on file  . Ran Out of  Food in the Last Year: Not on file  Transportation Needs:   . Lack of Transportation (Medical): Not on file  . Lack of Transportation (Non-Medical): Not on file  Physical Activity:   . Days of Exercise per Week: Not on file  . Minutes of Exercise per Session: Not on file  Stress:   . Feeling of Stress : Not on file  Social Connections:   . Frequency of Communication with Friends and Family: Not on file  . Frequency of Social Gatherings with Friends and Family: Not on file  . Attends Religious Services: Not on file  . Active Member of Clubs or Organizations: Not on file  . Attends Archivist Meetings: Not on file  . Marital Status: Not on file  Intimate Partner Violence:   . Fear of Current or Ex-Partner: Not on file  . Emotionally Abused: Not on file  . Physically Abused: Not on file  . Sexually Abused: Not on file    Family History:  Family  History  Problem Relation Age of Onset  . Diabetes Mother   . Dementia Mother     Medications:   Current Outpatient Medications on File Prior to Visit  Medication Sig Dispense Refill  . amLODipine (NORVASC) 2.5 MG tablet Take 1 tablet (2.5 mg total) by mouth daily. 30 tablet 11  . Coenzyme Q10 (CO Q 10 PO) Take 1 tablet by mouth daily.    Marland Kitchen levETIRAcetam (KEPPRA) 500 MG tablet Take 1 tablet (500 mg total) by mouth 2 (two) times daily. 60 tablet 3  . metoprolol tartrate (LOPRESSOR) 25 MG tablet Take 1 tablet (25 mg total) by mouth 2 (two) times daily. 60 tablet 11   No current facility-administered medications on file prior to visit.    Allergies:   Allergies  Allergen Reactions  . Sulfonamide Derivatives Rash     Physical Exam  Vitals:   10/09/19 1400  BP: 135/77  Pulse: (!) 57  Temp: (!) 97 F (36.1 C)  TempSrc: Oral  Weight: 161 lb 9.6 oz (73.3 kg)  Height: 6' (1.829 m)   Body mass index is 21.92 kg/m. No exam data present  General: well developed, well nourished,  pleasant middle-age Caucasian male,  seated, in no evident distress Head: head normocephalic and atraumatic.   Neck: supple with no carotid or supraclavicular bruits Cardiovascular: regular rate and rhythm, no murmurs Musculoskeletal: no deformity Skin:  no rash/petichiae Vascular:  Normal pulses all extremities   Neurologic Exam Mental Status: Awake and fully alert.   Normal speech and language.  Oriented to place and time. Recent and remote memory intact. Attention span, concentration and fund of knowledge appropriate. Mood and affect appropriate.  Cranial Nerves: Pupils equal, briskly reactive to light. Extraocular movements full without nystagmus. Visual fields full to confrontation. Hearing intact. Facial sensation intact. Face, tongue, palate moves normally and symmetrically.  Motor: Normal bulk and tone. Normal strength in all tested extremity muscles. Sensory.: intact to touch , pinprick , position and vibratory sensation.  Coordination: Rapid alternating movements normal in all extremities. Finger-to-nose and heel-to-shin performed accurately bilaterally. Gait and Station: Arises from chair without difficulty. Stance is normal. Gait demonstrates normal stride length and balance Reflexes: 1+ and symmetric. Toes downgoing.     NIHSS  0 Modified Rankin  0    Diagnostic Data (Labs, Imaging, Testing)  MR BRAIN W WO CONTRAST 09/07/2019 IMPRESSION: This MRI of the brain with and without contrast shows the following: 1.   Expected evolution of the left parietal hemorrhage.  Chronic heme products are noted.  The mass-effect has resolved.  There is gyral enhancement. 2.   Mild chronic microvascular ischemic change 3.   No acute findings or new findings compared to the 07/14/2019 MRI.  EEG 09/10/2019 Summary  Normal electroencephalogram, awake, asleep and with activation procedures. There are no focal lateralizing or epileptiform features.    Ct Head Code Stroke Wo Contrast 07/14/2019 IMPRESSION: 2.6 cm acute left  parietal parenchymal hemorrhage. Mild edema without mass effect.  Ct Angio Head W Or Wo Contrast Ct Angio Neck W Or Wo Contrast 07/14/2019 IMPRESSION:  No large vessel occlusion, stenosis, or significant atherosclerosis in the head and neck.   Ct Head Wo Contrast 07/15/2019 IMPRESSION:  1. Unchanged size of posterior left parietal hemorrhage with surrounding edema.  2. Possible tiny hemorrhage in the dependent left lateral ventricle. No other change from prior exam.   Mr [name] Wo Contrast 07/15/2019 IMPRESSION:  1. Stable size and morphology of intra-axial hemorrhage centered  at the left parietal convexity. Mild localized edema without significant regional mass effect. No underlying mass lesion, abnormal enhancement, or other structural abnormality identified.  2. No other acute intracranial process.  3. Underlying mild to moderate chronic microvascular ischemic disease.   ECG - SB rate 59 BPM. (See cardiology reading for complete details)  EEG 07/14/19 IMPRESSION: This studyshowed evidence of epileptogenicity in left frontal area. There was also severe diffuse encephalopathy, which could be secondary to sedated state.No seizures were seen throughout the recording. The excessive beta activity seen in the background is most likely due to the effect ofmedications likebenzodiazepine and is a benign EEG pattern.     ASSESSMENT: FRENCH LAMBERT is a 68 y.o. year old male presented with aphasia, confusion, agitation and elevated BP (post ictal)  on 07/13/2019 with stroke work-up revealing small acute left parietal ICH and mild scattered subarachnoid hemorrhage left parietal-occipital region with subsequent tonic-clonic seizure. Vascular risk factors include HTN and HLD.  Recovered well from a stroke standpoint without residual deficits. MRI W/WO contrast was performed which did not show underlying abnormalities for potential etiology.  He was evaluated by [facility] neurology for second  opinion regarding hemorrhage etiology.  He underwent angiogram which was unremarkable.    PLAN:  1. Left parietal ICH/parietal-occipital SAH: Due to small amount of blood product on recent MRI, recommend repeating MRI  W/wo contrast in 1 month to ensure possible etiology was not missed.  From discussion with patient regarding possible etiology which has yet to be determined at this time.  Question possible traumatic etiology with head injury and ongoing exertion and possible cognitive impairment prior to increased confusion and aphasia in which he presented to the ED with.  I did advise patient that his case will be further discussed with Dr. Leonie Man for further recommendations/input.  Advised to avoid aspirin, aspirin-containing products and ibuprofen products.  Continue fish oil for secondary stroke prevention. Maintain strict control of hypertension with blood pressure goal below 130/90, diabetes with hemoglobin A1c goal below 6.5% and cholesterol with LDL cholesterol (bad cholesterol) goal below 70 mg/dL.  I also advised the patient to eat a healthy diet with plenty of whole grains, cereals, fruits and vegetables, exercise regularly with at least 30 minutes of continuous activity daily and maintain ideal body weight.  Advised patient to slowly increase activity as tolerated and to limit stressors or activities that could increase blood pressure such as bearing down or lifting heavy objects 2. Seizure, post ICH: Continuation of Keppra 500 mg twice daily for seizure prophylaxis.  Refill provided.  Discussion regarding potential recurrent seizure activity if medication were to be stopped.  He is aware of [place] law regarding no driving for 6 months post seizure activity.  3. HTN: Advised to continue current treatment regimen.  Today's BP stable.  Advised to continue to monitor at home along with continued follow-up with PCP for management 4. HLD: Advised to continue current treatment regimen along with continued  follow-up with PCP for future prescribing and monitoring of lipid panel   Follow up in 4 months or call earlier if needed   Greater than 50% of time during this 25 minute visit was spent on counseling, explanation of diagnosis of left parietal ICH with S AH and potential etiologies, reviewing risk factor management of HTN, HLD, discussion regarding activity and restrictions, discussion regarding seizures post ICH, planning of further management along with potential future management, and discussion with patient answering all questions to satisfaction.    Janett Billow  Caryn Section  [facility] [address] [place],  [address]  Phone [phone] Fax [phone] Note: This document was prepared with digital dictation and possible smart phrase technology. Any transcriptional errors that result from this process are unintentional.

## 2019-10-10 NOTE — Progress Notes (Signed)
I agree with the above plan 

## 2019-11-01 DIAGNOSIS — Z8679 Personal history of other diseases of the circulatory system: Secondary | ICD-10-CM | POA: Diagnosis not present

## 2019-11-01 DIAGNOSIS — I10 Essential (primary) hypertension: Secondary | ICD-10-CM | POA: Diagnosis not present

## 2019-11-01 DIAGNOSIS — E7800 Pure hypercholesterolemia, unspecified: Secondary | ICD-10-CM | POA: Diagnosis not present

## 2019-11-01 DIAGNOSIS — R569 Unspecified convulsions: Secondary | ICD-10-CM | POA: Diagnosis not present

## 2019-11-12 ENCOUNTER — Other Ambulatory Visit: Payer: Self-pay | Admitting: Adult Health

## 2019-11-12 ENCOUNTER — Inpatient Hospital Stay: Admission: RE | Admit: 2019-11-12 | Payer: Medicare Other | Source: Ambulatory Visit

## 2019-11-12 ENCOUNTER — Other Ambulatory Visit: Payer: Self-pay

## 2019-11-12 ENCOUNTER — Ambulatory Visit: Admission: RE | Admit: 2019-11-12 | Payer: Medicare Other | Source: Ambulatory Visit

## 2019-11-12 ENCOUNTER — Ambulatory Visit
Admission: RE | Admit: 2019-11-12 | Discharge: 2019-11-12 | Disposition: A | Discharge status: Home / Self Care | Payer: Medicare Other | Source: Ambulatory Visit | Attending: Adult Health | Admitting: Adult Health

## 2019-11-12 DIAGNOSIS — I609 Nontraumatic subarachnoid hemorrhage, unspecified: Secondary | ICD-10-CM

## 2019-11-12 MED ORDER — GADOBENATE DIMEGLUMINE 529 MG/ML IV SOLN
15.0000 mL | Freq: Once | INTRAVENOUS | Status: AC | PRN
  Administered 2019-11-12: 15 mL via INTRAVENOUS

## 2019-11-16 IMAGING — CT CT HEAD W/O CM
4 series · 17 of 47 positions shown, 19 images · non-contrast
Comparison: Head CT earlier this day.

CLINICAL DATA: Followup known intracranial hemorrhage.

EXAM:
CT HEAD WITHOUT CONTRAST
TECHNIQUE: Contiguous axial images were obtained from the base of the skull
through the vertex without intravenous contrast.

[Series 3: head without · axial · non-contrast · 0.44mm/px · z∈[+1276,+1411]mm · 7 of 37 slices shown, 9 images]
[im 5/37  brain]
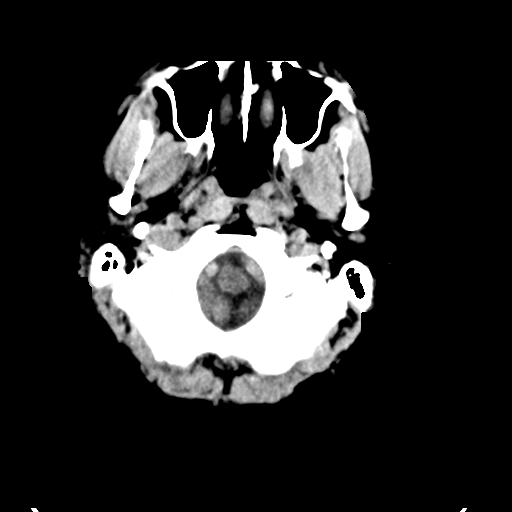
[im 5/37  bone]
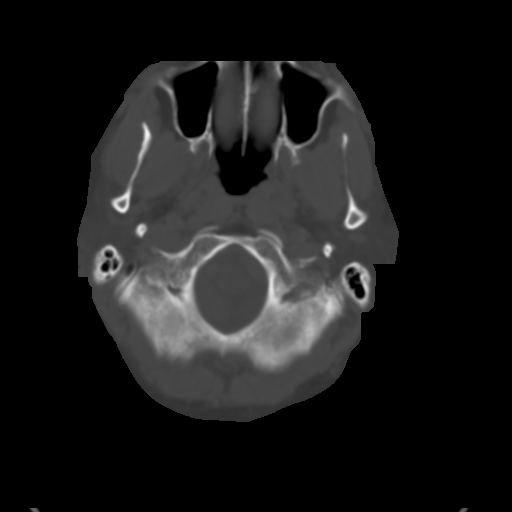
[im 10/37  brain]
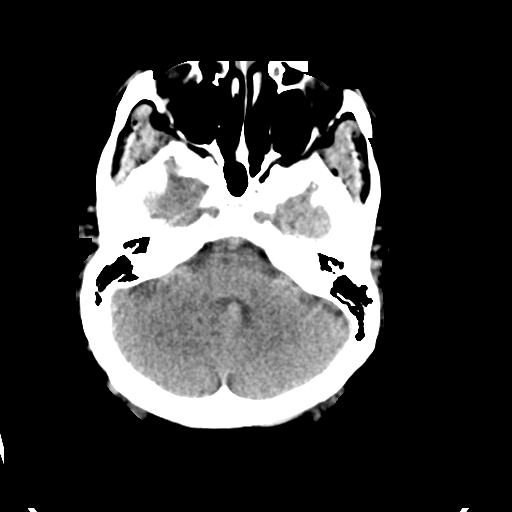
[im 14/37  brain]
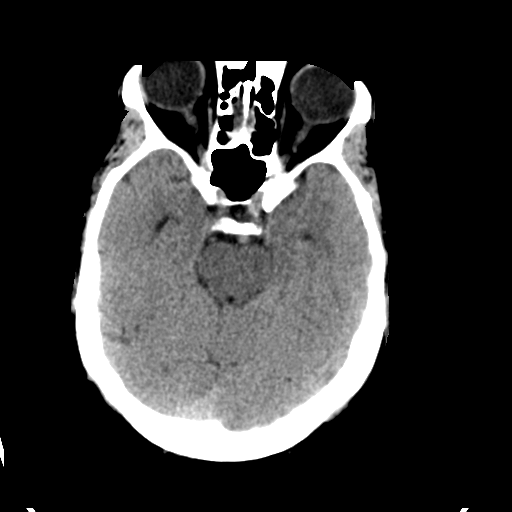
[im 19/37  brain]
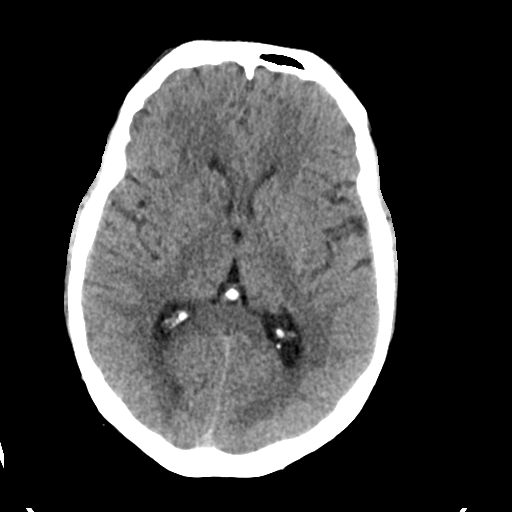
[im 23/37  brain]
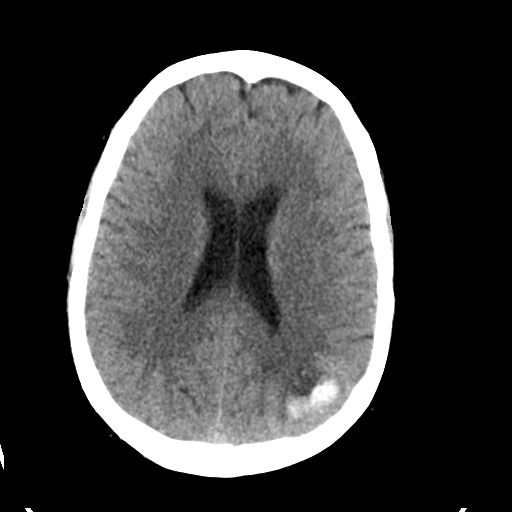
[im 23/37  bone]
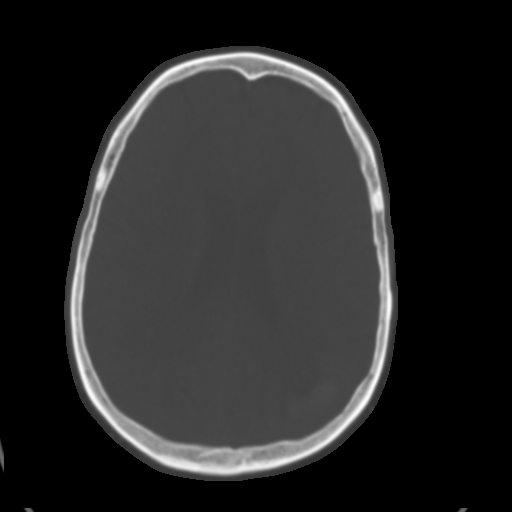
[im 28/37  brain]
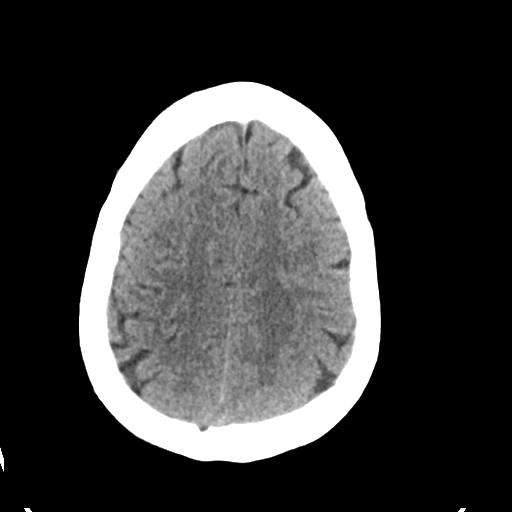
[im 32/37  brain]
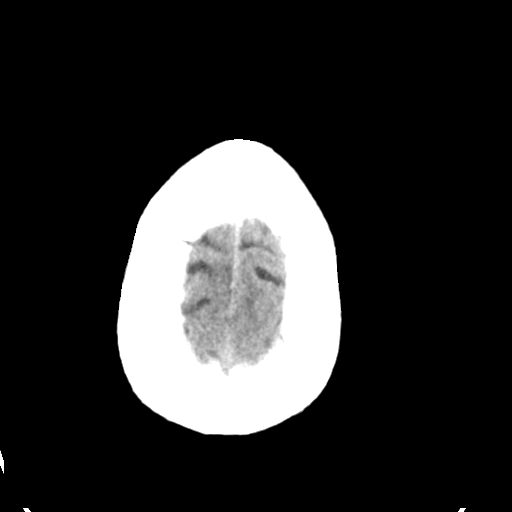

[Series 4: head bone · axial · 0.44mm/px · z∈[+1274,+1338]mm · 4 of 93 slices shown]
[im 10/93  bone]
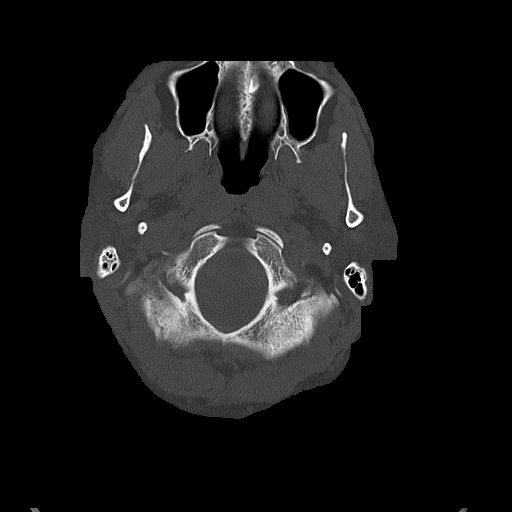
[im 19/93  bone]
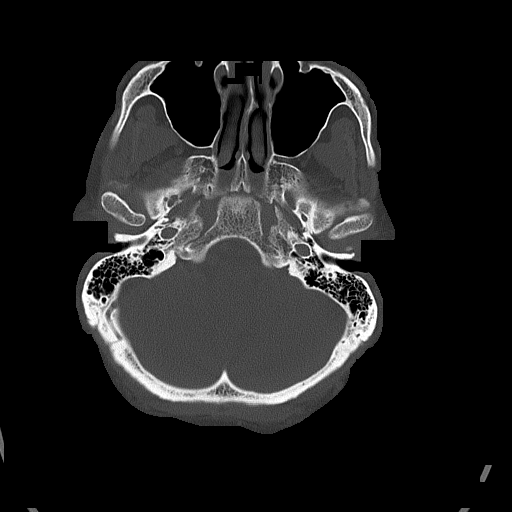
[im 28/93  bone]
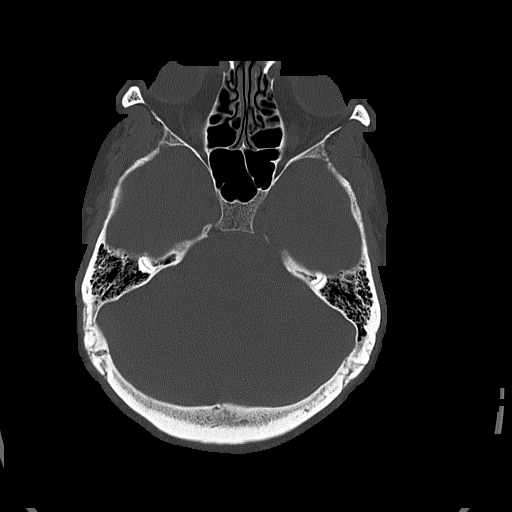
[im 42/93  bone]
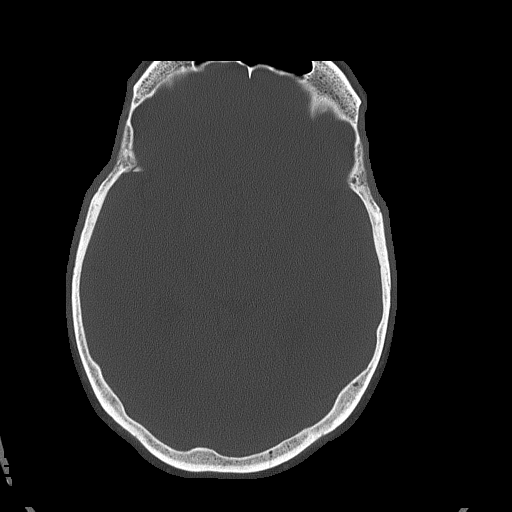

[Series 5: head without cor · coronal · non-contrast · 0.36mm/px · 3 of 74 slices shown]
[im 25/74  brain]
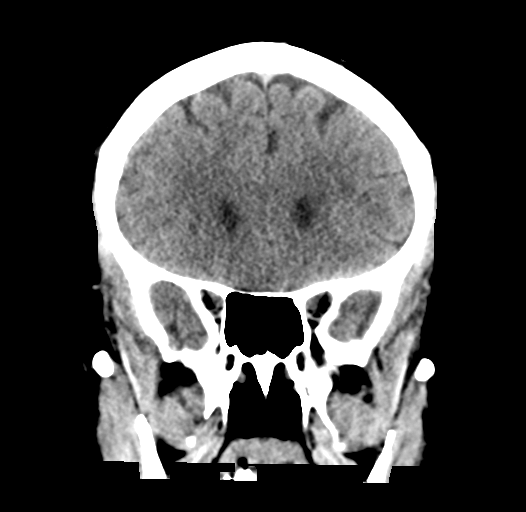
[im 33/74  brain]
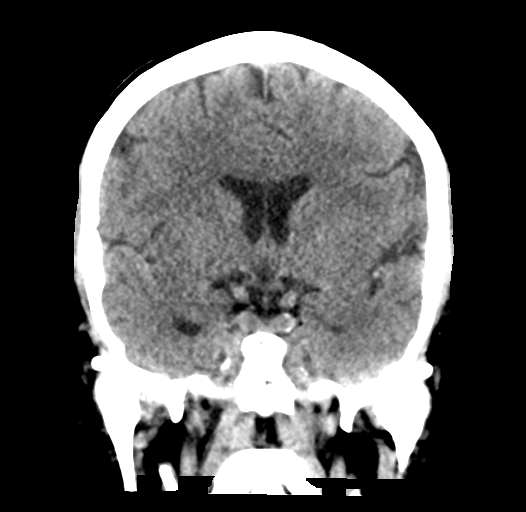
[im 41/74  brain]
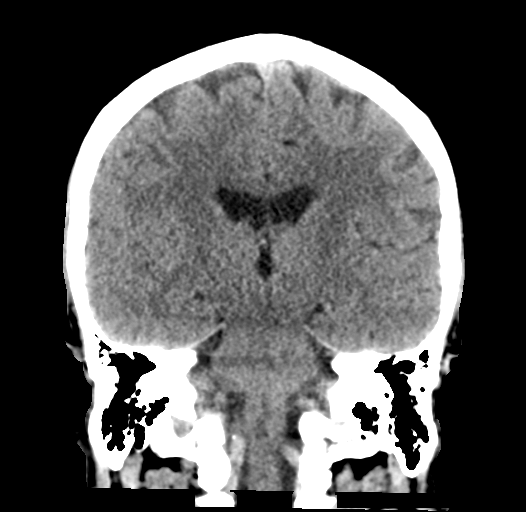

[Series 6: head without sag · sagittal · non-contrast · 0.38mm/px · 3 of 58 slices shown]
[im 20/58  brain]
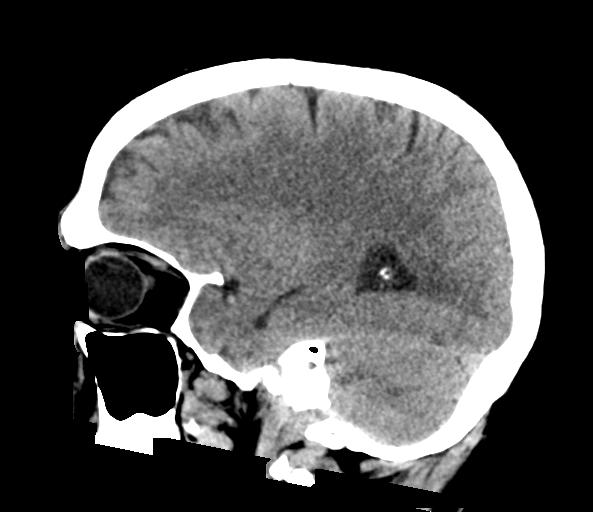
[im 29/58  brain]
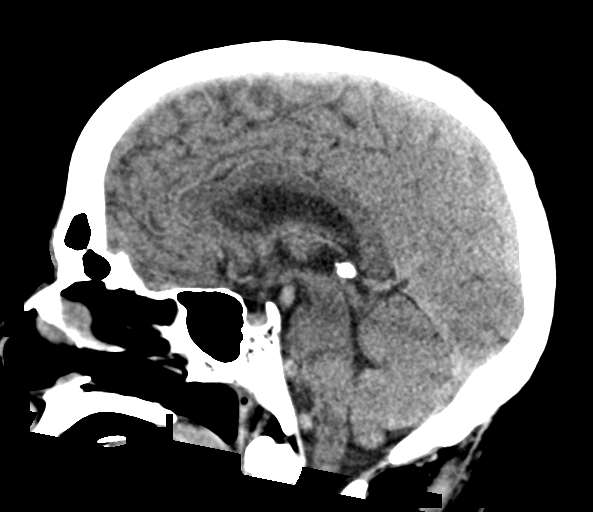
[im 39/58  brain]
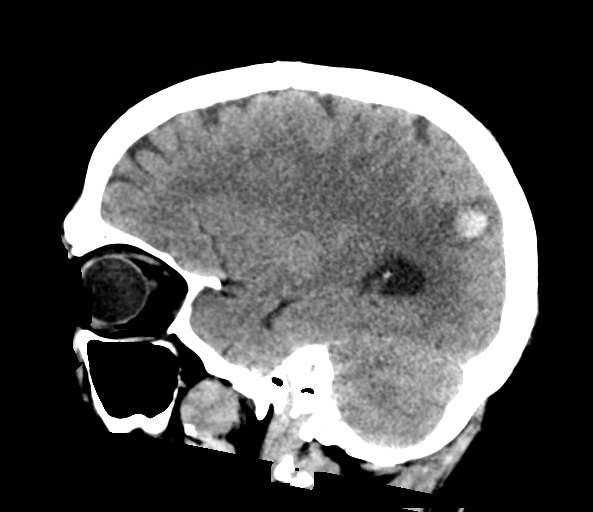

[17 of 47 positions shown; findings below may reference images not displayed]

FINDINGS: Brain: Posterior left parietal hemorrhage measures 2.7 x 2.4 x
cm (essentially unchanged from prior exam allowing for differences
in caliper placement). Similar degree of surrounding edema. No
associated midline shift or mass effect. Possible tiny hemorrhage in
the dependent left lateral ventricle. No other new hemorrhage. No
hydrocephalus. The basilar cisterns are patent. No subdural or
extra-axial collection.

Vascular: No hyperdense vessel or evidence of large vessel
occlusion. Slight increased density of intracranial vasculature may
be secondary to prior IV contrast administration.

Skull: No fracture or focal lesion.

Sinuses/Orbits: No acute findings. Bilateral cataract resection.

Other: None.
IMPRESSION: 1. Unchanged size of posterior left parietal hemorrhage with
surrounding edema.
2. Possible tiny hemorrhage in the dependent left lateral ventricle.
No other change from prior exam.

## 2019-11-16 IMAGING — CT CT ANGIO HEAD
2 of 7 series · 8 of 33 positions shown · IV contrast (OMNI 350)
Comparison: None.

CLINICAL DATA: Follow-up intracranial hemorrhage.

EXAM:
CT ANGIOGRAPHY HEAD AND NECK
TECHNIQUE: Multidetector CT imaging of the head and neck was performed using
the standard protocol during bolus administration of intravenous
contrast. Multiplanar CT image reconstructions and MIPs were
obtained to evaluate the vascular anatomy. Carotid stenosis
measurements (when applicable) are obtained utilizing NASCET
criteria, using the distal internal carotid diameter as the
denominator.
CONTRAST:  100mL OMNIPAQUE IOHEXOL 350 MG/ML SOLN

[Series 5: cta neck · axial · 0.47mm/px · z∈[-221,-99]mm · 2 of 185 slices shown]
[im 62/185  soft-tissue]
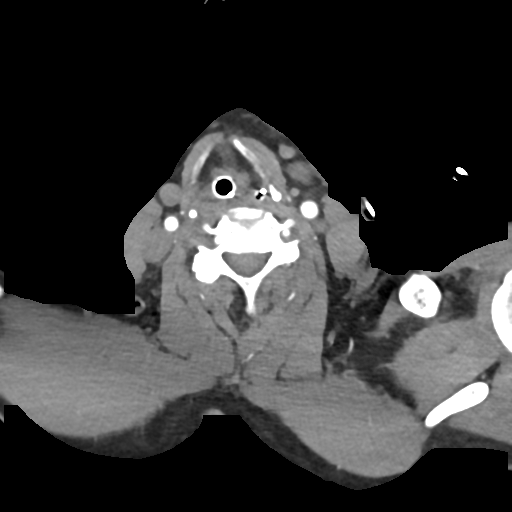
[im 123/185  soft-tissue]
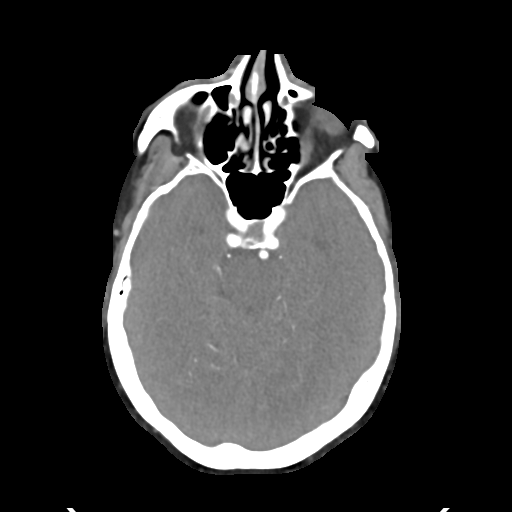

[Series 7: cta neck axial · axial · 0.39mm/px · z∈[-290,-28]mm · 6 of 368 slices shown]
[im 53/368  soft-tissue]
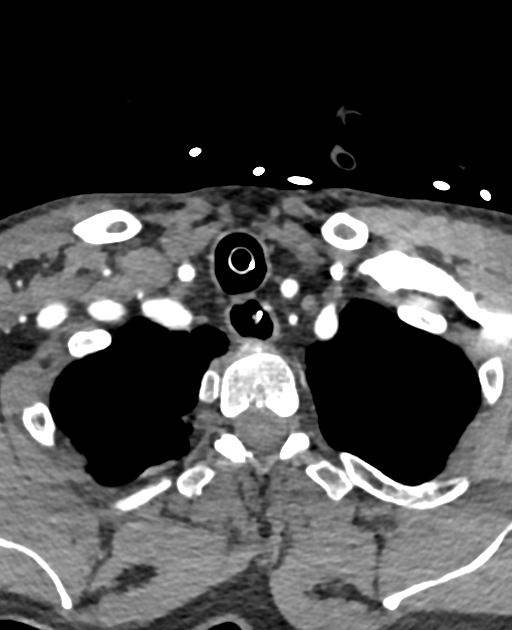
[im 105/368  bone]
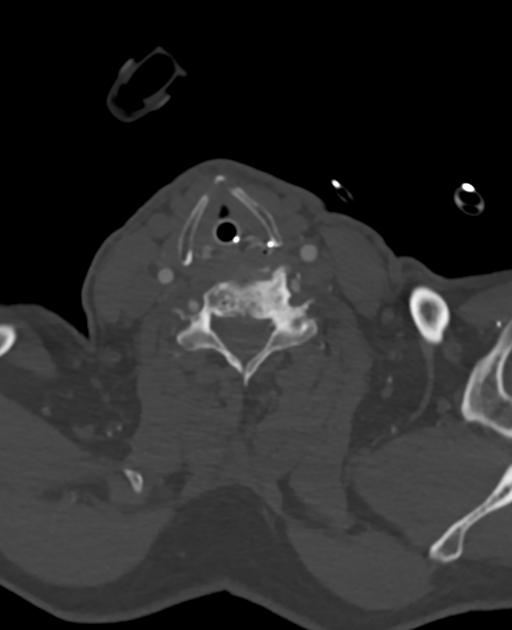
[im 158/368  soft-tissue]
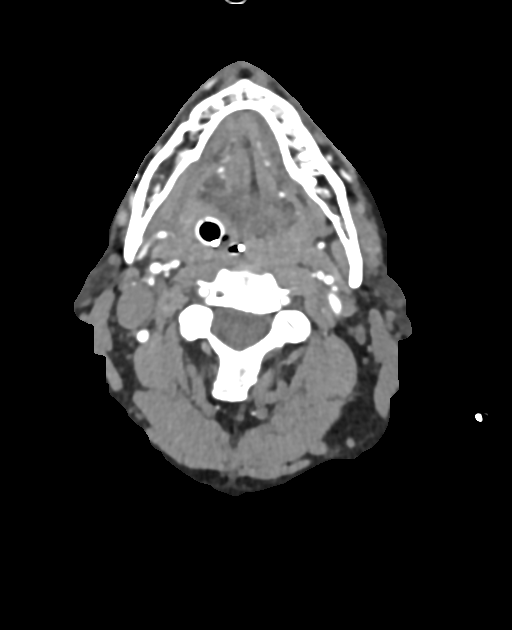
[im 210/368  bone]
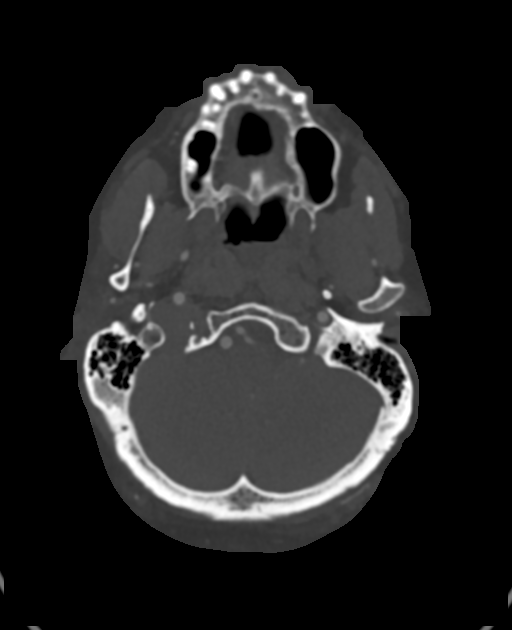
[im 263/368  soft-tissue]
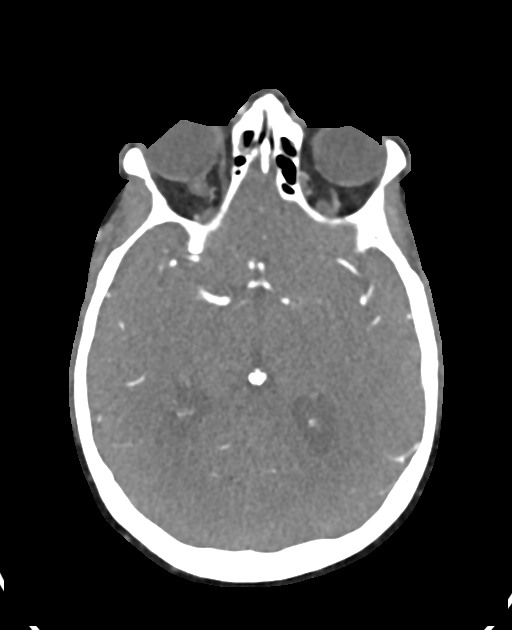
[im 315/368  bone]
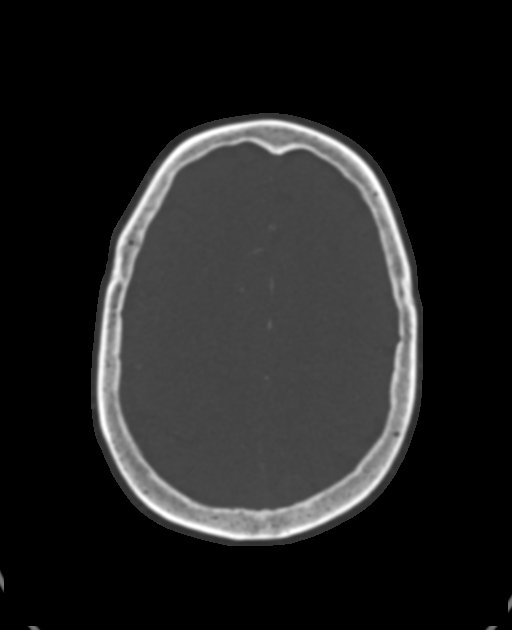

[8 of 33 positions shown; findings below may reference images not displayed]

FINDINGS: CTA NECK FINDINGS

Aortic arch: Widely patent arch vessel origins. Common origin of the
left subclavian artery and left vertebral artery from the arch with
an ectatic/fusiform dilated appearance of the common vessel
measuring 2 cm in diameter and with a normal caliber of the
vertebral and subclavian arteries more distally. No saccular
aneurysm.

Right carotid system: Patent and smooth without evidence of stenosis
or dissection.

Left carotid system: Patent and smooth without evidence of stenosis
or dissection.

Vertebral arteries: Patent and smooth without evidence of stenosis
or dissection. Mildly dominant right vertebral artery.

Skeleton: Moderate cervical disc and facet degeneration.

Other neck: No evidence of cervical lymphadenopathy or mass.
Endotracheal and enteric tubes in place with the former terminating
well above the carina.

Upper chest: Clear lung apices.

Review of the MIP images confirms the above findings

CTA HEAD FINDINGS

Anterior circulation: The internal carotid arteries are widely
patent from skull base to carotid termini. ACAs and MCAs are patent
without evidence of proximal branch occlusion or significant
proximal stenosis. No aneurysm or vascular malformation is
identified.

Posterior circulation: The intracranial vertebral arteries are
widely patent to the basilar. A patent left PICA and bilateral SCAs
are visualized. AICAs and a right PICA are not clearly identified.
The basilar artery is widely patent. There is a small left posterior
communicating artery. Both PCAs are patent without evidence of
significant stenosis. No aneurysm or vascular malformation is
identified.

Venous sinuses: Not well evaluated due to arterial phase contrast
timing.

Anatomic variants: None.

Review of the MIP images confirms the above findings
IMPRESSION: No large vessel occlusion, stenosis, or significant atherosclerosis
in the head and neck.

## 2019-11-19 IMAGING — DX DG CHEST 2V
2 series · 2 of 2 positions shown · non-contrast
Comparison: Chest x-ray 07/15/2019

CLINICAL DATA: Fever and shortness of breath.

EXAM:
CHEST - 2 VIEW

[chest pa]
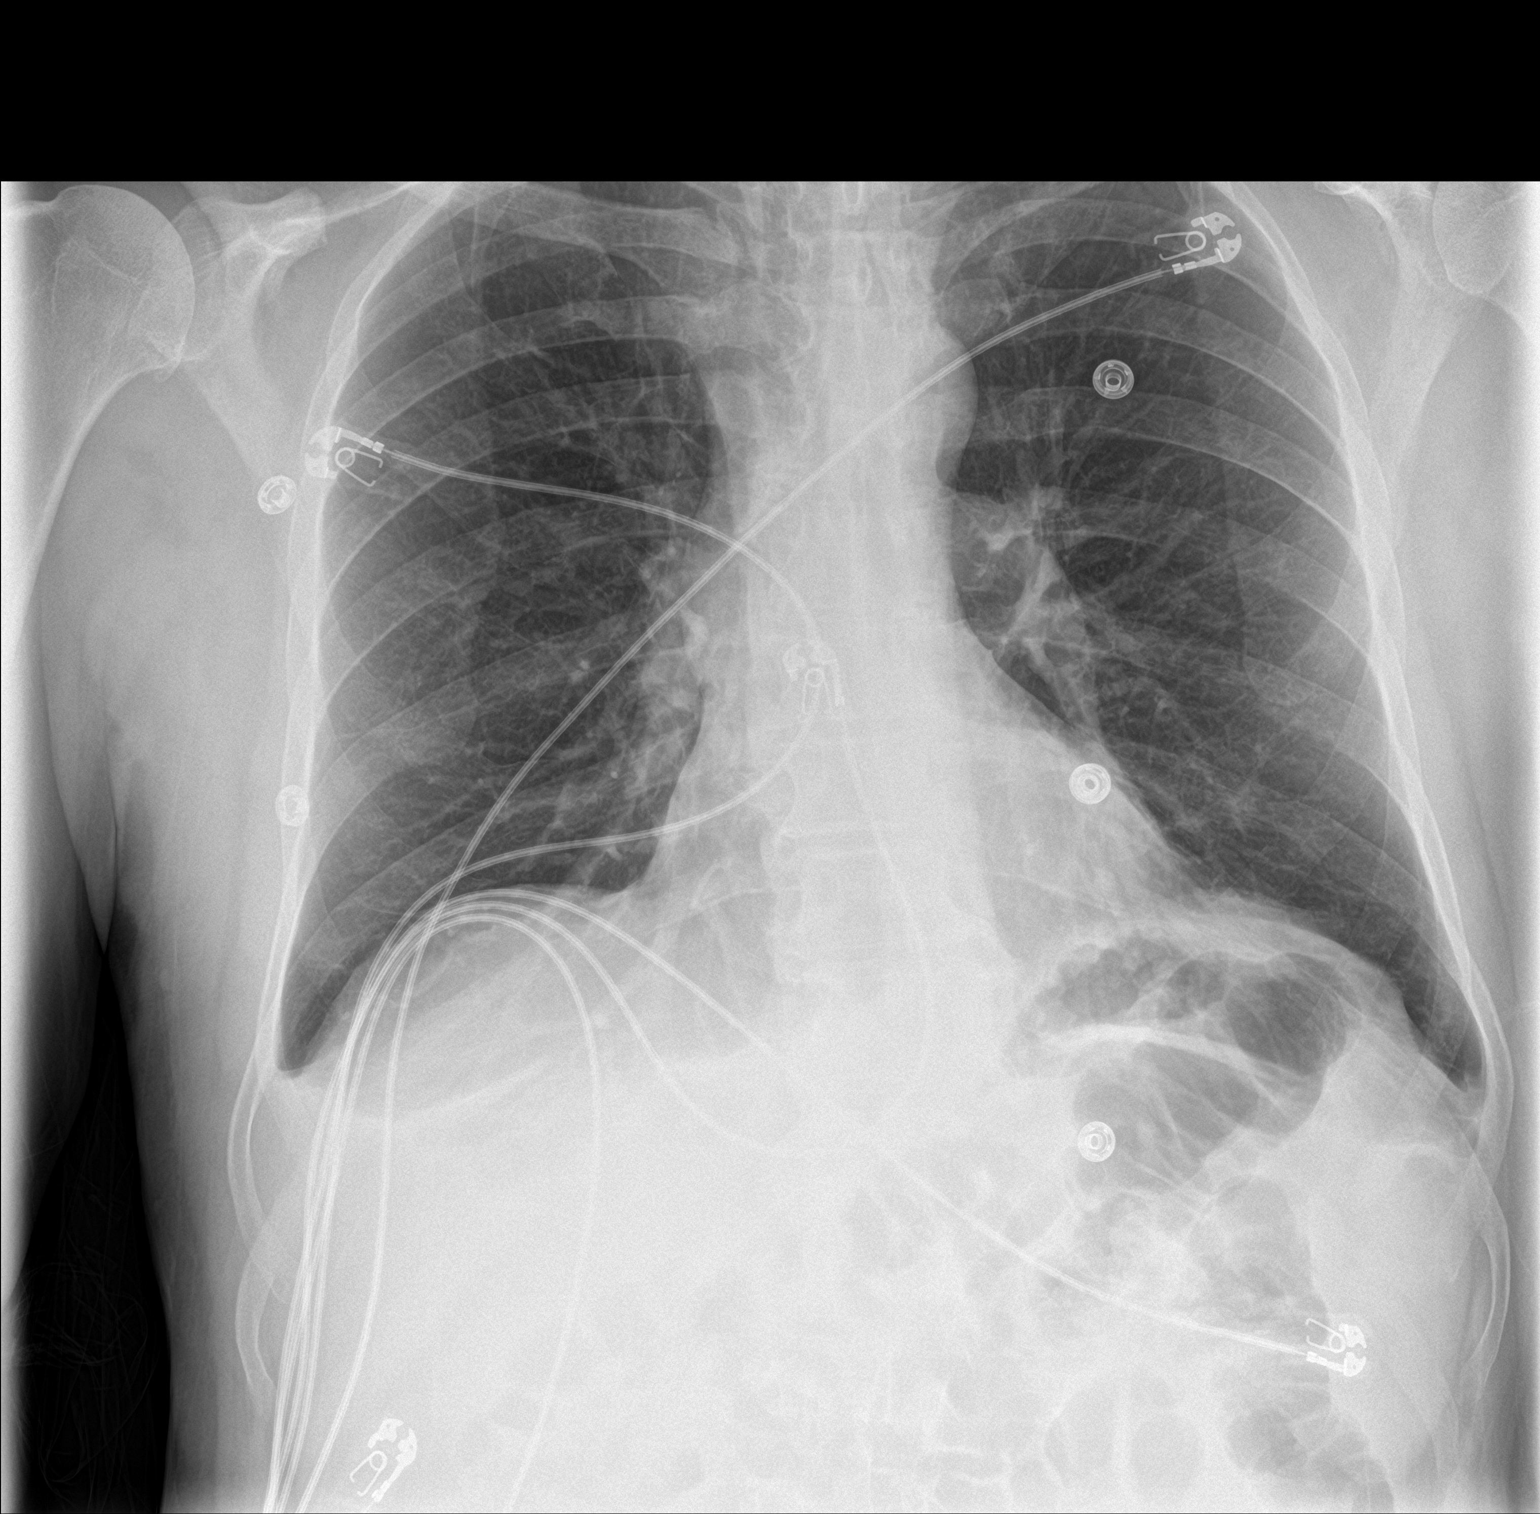

[chest lat]
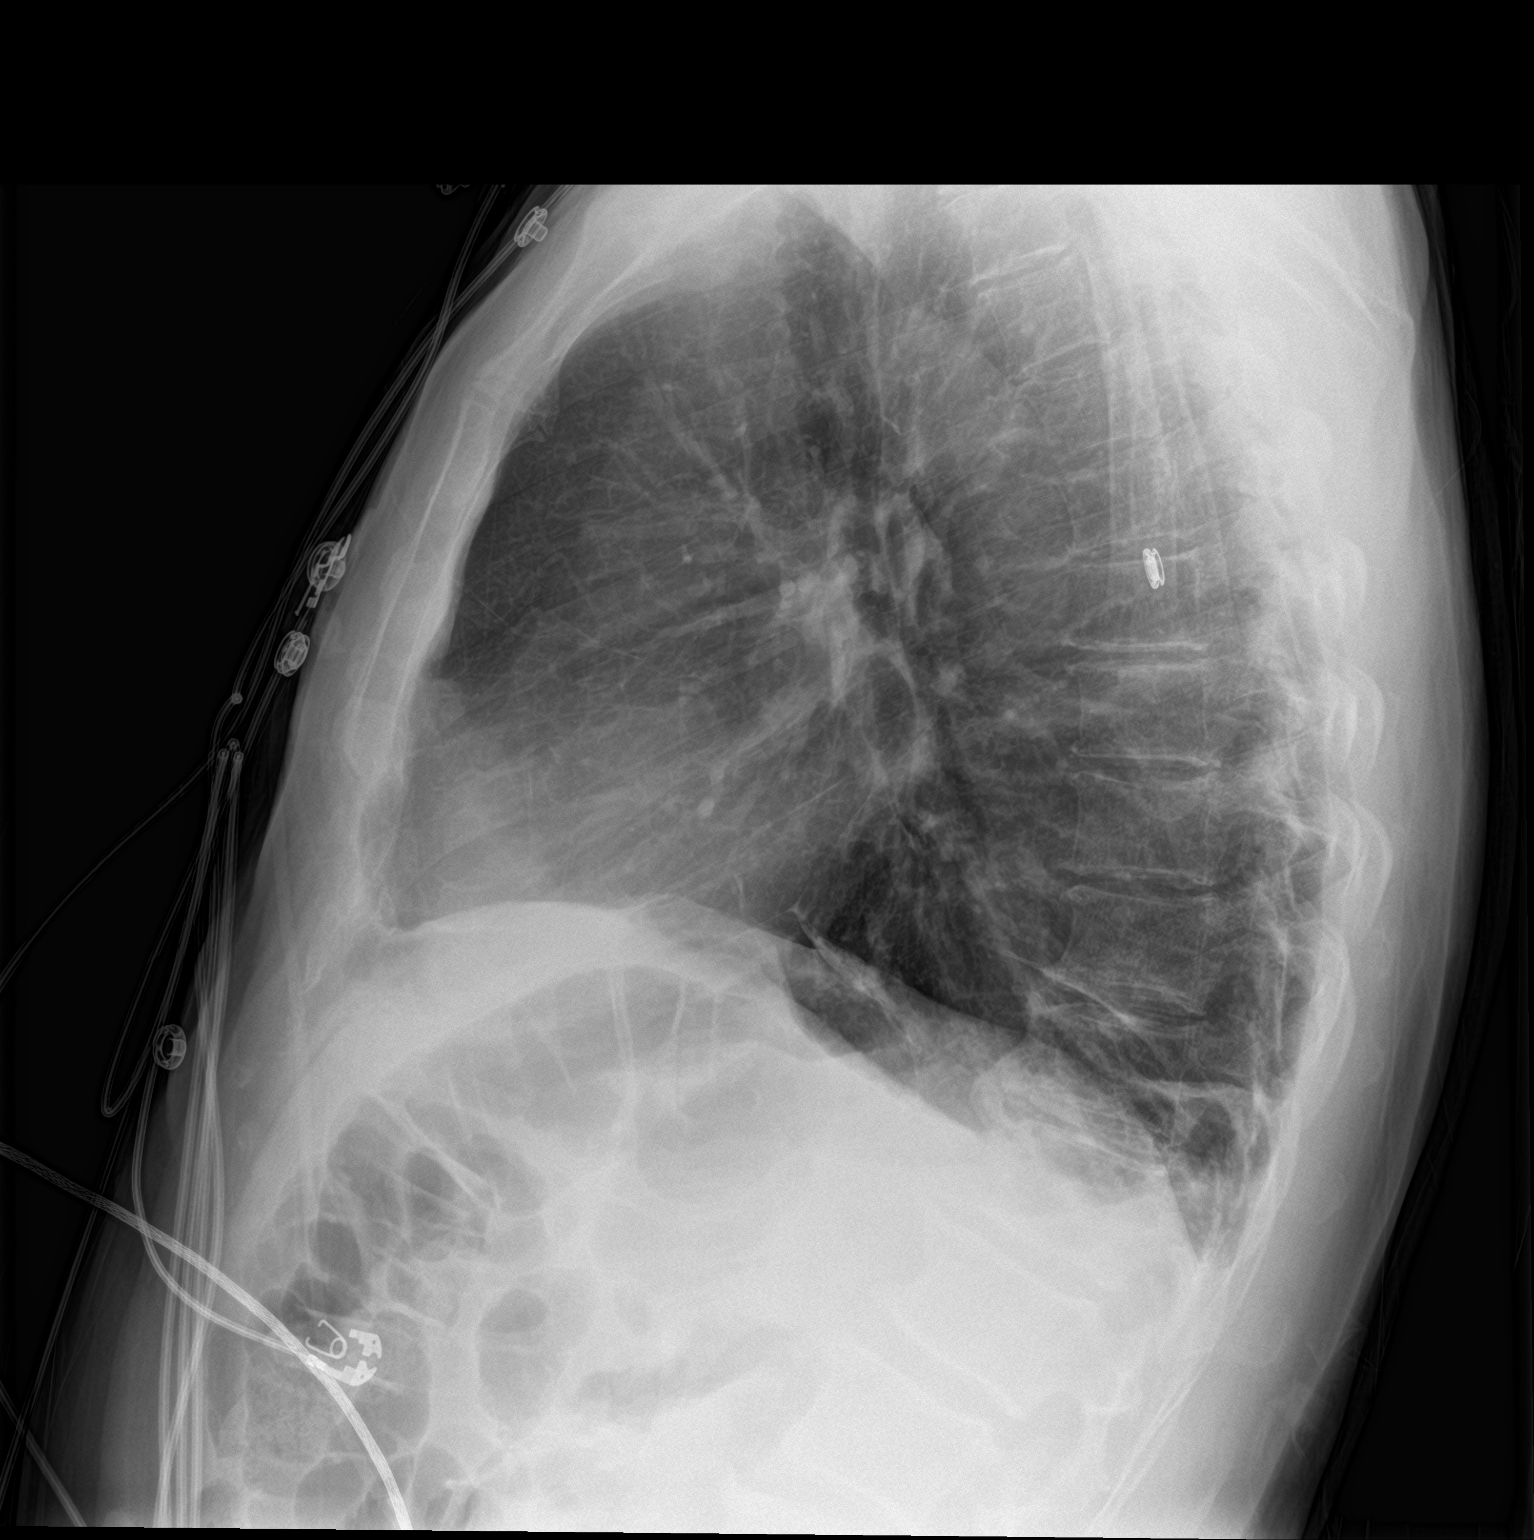

[2 of 2 positions shown; findings below may reference images not displayed]

FINDINGS: The endotracheal tube and NG tubes have been removed. The cardiac
silhouette, mediastinal and hilar contours are within normal limits
and stable. There are small bilateral pleural effusions and
bibasilar atelectasis but no definite infiltrates, edema or
worrisome pulmonary lesions. The bony thorax is intact.
IMPRESSION: 1. Removal of the ET and NG tubes.
2. Small bilateral pleural effusions and bibasilar atelectasis.

## 2019-11-20 ENCOUNTER — Telehealth: Payer: Self-pay | Admitting: Adult Health

## 2019-11-20 NOTE — Telephone Encounter (Signed)
Called Thomas Norman regarding recent MRI result.  Advised him that recent imaging was similar compared to prior imaging without any underlying etiologies of prior ICH.  Reviewed imaging with Dr. Leonie Man and agree that at this time prior hemorrhage is indeterminate as he has underwent cerebral angiograms and multiple follow-up MRIs without any evidence of aneurysms, tumors, venous sinus thrombosis, vasculitis or AVMs.  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  Advised him that as long as he continues to do well without any reoccurring seizure events, he is cleared to return to driving at that time.  Answered all questions to patient satisfaction and appreciative of phone call.

## 2019-12-17 DIAGNOSIS — L570 Actinic keratosis: Secondary | ICD-10-CM | POA: Diagnosis not present

## 2019-12-17 DIAGNOSIS — D225 Melanocytic nevi of trunk: Secondary | ICD-10-CM | POA: Diagnosis not present

## 2019-12-17 DIAGNOSIS — L821 Other seborrheic keratosis: Secondary | ICD-10-CM | POA: Diagnosis not present

## 2019-12-17 DIAGNOSIS — Z85828 Personal history of other malignant neoplasm of skin: Secondary | ICD-10-CM | POA: Diagnosis not present

## 2019-12-17 DIAGNOSIS — L111 Transient acantholytic dermatosis [Grover]: Secondary | ICD-10-CM | POA: Diagnosis not present

## 2019-12-17 DIAGNOSIS — D1801 Hemangioma of skin and subcutaneous tissue: Secondary | ICD-10-CM | POA: Diagnosis not present

## 2020-01-23 DIAGNOSIS — Z961 Presence of intraocular lens: Secondary | ICD-10-CM | POA: Diagnosis not present

## 2020-01-23 DIAGNOSIS — H35373 Puckering of macula, bilateral: Secondary | ICD-10-CM | POA: Diagnosis not present

## 2020-02-10 ENCOUNTER — Other Ambulatory Visit: Payer: Self-pay

## 2020-02-10 ENCOUNTER — Encounter: Payer: Self-pay | Admitting: Adult Health

## 2020-02-10 ENCOUNTER — Ambulatory Visit (INDEPENDENT_AMBULATORY_CARE_PROVIDER_SITE_OTHER): Payer: Medicare Other | Admitting: Adult Health

## 2020-02-10 VITALS — BP 122/70 | HR 70 | Temp 97.1°F | Ht 71.0 in | Wt 167.0 lb

## 2020-02-10 DIAGNOSIS — I639 Cerebral infarction, unspecified: Secondary | ICD-10-CM

## 2020-02-10 DIAGNOSIS — I609 Nontraumatic subarachnoid hemorrhage, unspecified: Secondary | ICD-10-CM | POA: Diagnosis not present

## 2020-02-10 DIAGNOSIS — I10 Essential (primary) hypertension: Secondary | ICD-10-CM | POA: Diagnosis not present

## 2020-02-10 DIAGNOSIS — E785 Hyperlipidemia, unspecified: Secondary | ICD-10-CM

## 2020-02-10 DIAGNOSIS — R569 Unspecified convulsions: Secondary | ICD-10-CM | POA: Diagnosis not present

## 2020-02-10 NOTE — Patient Instructions (Addendum)
Your Plan:  Continue Keppra 500 mg twice daily for seizure prophylaxis - refill placed  Continue managing stroke risk factors with routine follow-up by PCP    Follow-up in 6 months or call earlier if needed      Thank you for coming to see [place] at [facility]. I hope we have been able to provide you high quality care today.  You may receive a patient satisfaction survey over the next few weeks. We would appreciate your feedback and comments so that we may continue to improve ourselves and the health of our patients.

## 2020-02-10 NOTE — Progress Notes (Signed)
[facility] [address] [place]. [place] [address] [phone]       STROKE FOLLOW UP NOTE  [name] Date of Birth:  Feb 23, 1951 Medical Record Number:  [id number]   Reason for Referral: stroke follow up    CHIEF COMPLAINT:  Chief Complaint  Patient presents with  . Follow-up    rm 9, hx of stroke, pt states he feels great    HPI:  Today, 02/10/2020, Thomas Norman returns for follow-up regarding left parietal ICH in 06/2019.  He has been doing well since prior visit without residual stroke deficits or new/reoccurring stroke/TIA symptoms.  He does note occasional short-term memory issues prior to his stroke and has noticed improvements of short-term memory after his stroke.  He also endorses prior episodes of transient confusion lasting approximately 30 minutes and then subsiding.  He reports 3 prior episodes with last episode occurring approximately 3 years prior to his stroke.  He had no work-up or evaluation for those episodes previously.  Denies any recurrent episodes or concerns.  Repeat MRI w/wo contrast 11/12/2019 again did not show any abnormalities or significant change from prior MRI.  Continues on Keppra 500 mg twice daily tolerating well without recurrent seizure activity.  Blood pressure today 122/70.  Reports recent lab work by PCP which did show slightly elevated lipid panel and has restarted on omega-3 and plans on repeating lipid panel PCP in the fall.  No concerns at this time.      History provided for reference purposes only Update 10/09/2019: Thomas Norman is a 69 year old male who is being seen today for stroke follow-up.  He has been doing well from a stroke standpoint without residual deficits or reoccurring/new stroke/TIA symptoms.  MRI w/wo contrast was done on 09/07/2019 which did not show any underlying abnormalities for possible cause/etiology of recent hemorrhage.  Also repeated EEG which was normal.  He was evaluated by [facility]  neurology for second opinion regarding left parietal ICH etiology.  It was recommended by Dr. Patsey Berthold to undergo catheter angiogram for further evaluation.  Angiogram unremarkable for evidence of AVMs or abnormalities.  He continues to be stable from a neurological standpoint without residual deficits.  He continues on Keppra tolerating well without reoccurring seizure activity.  Continues to monitor blood pressure at home and typically range 100-1 29/70 to 80s.  Patient questions etiology of hemorrhage which was primarily discussed during visit.  He does endorse after his fall, he immediately returned to trying to pull his trailer using increased exertion and continued increased exertion up until he presented to ED.  He also endorses possible worsening cognition/memory as he uses a calendar to write dates are important information down typically 1 day/week but he noticed after his fall, he was writing something on his calendar daily.  He also reports upon his symptom onset of increased confusion and aphasia, he took 2x 325 mg tablets of aspirin as he believes he was having a stroke.  Stroke admission 07/13/2019: Thomas Norman is a 69 y.o. male with history of HTN and HLD  presented on 07/13/2019 with aphasia and confusion.  Witnessed tonic-clonic seizure activity in ED lobby.    Stroke work-up revealed small acute left parietal ICH as evidenced on CT head and MRI likely related to hypertension.  He did not receive IV t-PA due to [place].  MRI did not show evidence of underlying mass lesion, AVM or CAA but did show mild scattered subarachnoid hemorrhage seen within the adjacent left  frontal-occipital region.  CTA head/neck unremarkable.  LDL 95.  A1c 5.2.  Witnessed tonic-clonic seizure while in ER with EEG showing evidence of epileptogenicity in left frontal area along with severe diffuse encephalopathy likely secondary to sedated state during EEG.  Elevated blood pressure post seizure activity but otherwise  stable during admission.  Loaded with Keppra and recommended continuation at discharge for seizure prophylaxis.  HTN and HLD stable.  Other stroke risk factors include advanced age and EtOH use but no prior history of stroke.  Discharged home in stable condition recommendation of outpatient PT/OT.  Initial visit 08/19/2019 Thomas Norman: Thomas Norman is a 69 year old male who is being seen today for stroke follow-up accompanied by his wife.  He has been doing well since discharge with complete resolution of residual deficits.  He was initially participating in outpatient PT/ST and has since been discharged.  He has not returned back to prior activities as he was not sure of any restrictions and fear of reoccurring stroke or worsening of bleed.  He does endorse roughly 10 days prior to symptom onset, he was attempting to move a loaded trailer but his hand slipped off the jack and fell backwards "with considerable force" hitting the back portion of his head.  He denies loss of consciousness but reports "worst head "I ever remember having".  He did experience pain for approximately 30 minutes but then resolved and did not experience any headache, vision loss, weakness, AMS or speech difficulty until he presented to the ED.  Blood pressure has always been well managed typically ranging 110-120/70s.  Blood pressure today 116/70.  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  He has remained on Keppra 500 mg twice daily without side effects or reoccurring seizure activity.  He does have a history of easy bruising noticeably greater distal extremities upper>lower.  He ensures avoidance of aspirin, aspirin-containing products or ibuprofen products. no further concerns at this time.        ROS:   14 system review of systems performed and negative with exception of see HPI  PMH:  Past Medical History:  Diagnosis Date  . Hypertension   . Kidney  calculi     PSH:  Past Surgical History:  Procedure Laterality Date  . LITHOTRIPSY    . skin cancers    . TONSILLECTOMY      Social History:  Social History   Socioeconomic History  . Marital status: Married    Spouse name: Not on file  . Number of children: Not on file  . Years of education: Not on file  . Highest education level: Not on file  Occupational History  . Not on file  Tobacco Use  . Smoking status: Never Smoker  . Smokeless tobacco: Never Used  Substance and Sexual Activity  . Alcohol use: Yes    Alcohol/week: 1.0 standard drinks    Types: 1 Glasses of wine per week  . Drug use: No  . Sexual activity: Yes  Other Topics Concern  . Not on file  Social History Narrative  . Not on file   Social Determinants of Health   Financial Resource Strain:   . Difficulty of Paying Living Expenses:   Food Insecurity:   . Worried About Charity fundraiser in the Last Year:   . Arboriculturist in the Last Year:   Transportation Needs:   . Film/video editor (Medical):   Marland Kitchen  Lack of Transportation (Non-Medical):   Physical Activity:   . Days of Exercise per Week:   . Minutes of Exercise per Session:   Stress:   . Feeling of Stress :   Social Connections:   . Frequency of Communication with Friends and Family:   . Frequency of Social Gatherings with Friends and Family:   . Attends Religious Services:   . Active Member of Clubs or Organizations:   . Attends Archivist Meetings:   Marland Kitchen Marital Status:   Intimate Partner Violence:   . Fear of Current or Ex-Partner:   . Emotionally Abused:   Marland Kitchen Physically Abused:   . Sexually Abused:     Family History:  Family History  Problem Relation Age of Onset  . Diabetes Mother   . Dementia Mother     Medications:   Current Outpatient Medications on File Prior to Visit  Medication Sig Dispense Refill  . amLODipine (NORVASC) 2.5 MG tablet Take 1 tablet (2.5 mg total) by mouth daily. 30 tablet 11  .  Coenzyme Q10 (CO Q 10 PO) Take 1 tablet by mouth daily.    Marland Kitchen levETIRAcetam (KEPPRA) 500 MG tablet Take 1 tablet (500 mg total) by mouth 2 (two) times daily. 180 tablet 3  . metoprolol tartrate (LOPRESSOR) 25 MG tablet Take 1 tablet (25 mg total) by mouth 2 (two) times daily. 60 tablet 11  . Omega-3 Fatty Acids (FISH OIL) 1000 MG CAPS Take by mouth 3 (three) times a week.     No current facility-administered medications on file prior to visit.    Allergies:   Allergies  Allergen Reactions  . Sulfonamide Derivatives Rash     Physical Exam  Vitals:   02/10/20 1337  BP: 122/70  Pulse: 70  Temp: (!) 97.1 F (36.2 C)  Weight: 167 lb (75.8 kg)  Height: 5\' 11"  (1.803 m)   Body mass index is 23.29 kg/m. No exam data present  General: well developed, well nourished, pleasant middle-age Caucasian male, seated, in no evident distress Head: head normocephalic and atraumatic.   Neck: supple with no carotid or supraclavicular bruits Cardiovascular: regular rate and rhythm, no murmurs Musculoskeletal: no deformity Skin:  no rash/petichiae Vascular:  Normal pulses all extremities   Neurologic Exam Mental Status: Awake and fully alert.   Normal speech and language.  Oriented to place and time. Recent and remote memory intact. Attention span, concentration and fund of knowledge appropriate. Mood and affect appropriate.  Cranial Nerves: Pupils equal, briskly reactive to light. Extraocular movements full without nystagmus. Visual fields full to confrontation. Hearing intact. Facial sensation intact. Face, tongue, palate moves normally and symmetrically.  Motor: Normal bulk and tone. Normal strength in all tested extremity muscles. Sensory.: intact to touch , pinprick , position and vibratory sensation.  Coordination: Rapid alternating movements normal in all extremities. Finger-to-nose and heel-to-shin performed accurately bilaterally. Gait and Station: Arises from chair without difficulty.  Stance is normal. Gait demonstrates normal stride length and balance Reflexes: 1+ and symmetric. Toes downgoing.         ASSESSMENT: Thomas Norman is a 69 y.o. year old male presented with aphasia, confusion, agitation and elevated BP (post ictal)  on 07/13/2019 with stroke work-up revealing small acute left parietal ICH and mild scattered subarachnoid hemorrhage left parietal-occipital region with subsequent tonic-clonic seizure. Vascular risk factors include HTN and HLD.  Recovered well from a stroke standpoint without residual deficits. MRI W/WO contrast x2 negative for underlying abnormalities.  He was  evaluated by [facility] neurology for second opinion regarding hemorrhage etiology which remained indeterminate.      PLAN:  1. Left parietal ICH/parietal-occipital SAH:  -Etiology remains indeterminate which has been discussed with patient -No indication for antithrombotic therefore advised to avoid aspirin, aspirin-containing products and ibuprofen products.  Continue fish oil for secondary stroke prevention.  -Maintain strict control of hypertension with blood pressure goal below 130/90, diabetes with hemoglobin A1c goal below 6.5% and cholesterol with LDL cholesterol (bad cholesterol) goal below 70 mg/dL.  I also advised the patient to eat a healthy diet with plenty of whole grains, cereals, fruits and vegetables, exercise regularly with at least 30 minutes of continuous activity daily and maintain ideal body weight.  Advised patient to slowly increase activity as tolerated and to limit stressors or activities that could increase blood pressure such as bearing down or lifting heavy objects 2. Seizure, post ICH: Continuation of Keppra 500 mg twice daily for seizure prophylaxis.  Refill provided.  3. HTN: Stable.  Continue to monitor at home and ongoing follow-up with PCP for monitoring and management 4. HLD: Continue fish oil and ongoing follow-up with PCP for monitoring management   Follow  up in 6 months or call earlier if needed   I spent 33 minutes of face-to-face and non-face-to-face time with patient.  This included previsit chart review, lab review, study review, order entry, electronic health record documentation, patient education regarding indeterminate etiology of ICH, importance of managing stroke risk factors, post ICH seizure and answered all questions to patient satisfaction    Frann Rider, [facility]  [facility] [address] [place], [place] [address]  Phone [phone] Fax [phone] Note: This document was prepared with digital dictation and possible smart phrase technology. Any transcriptional errors that result from this process are unintentional.

## 2020-02-10 NOTE — Progress Notes (Signed)
I agree with the above plan 

## 2020-07-09 DIAGNOSIS — Z1211 Encounter for screening for malignant neoplasm of colon: Secondary | ICD-10-CM | POA: Diagnosis not present

## 2020-07-09 DIAGNOSIS — Z1389 Encounter for screening for other disorder: Secondary | ICD-10-CM | POA: Diagnosis not present

## 2020-07-09 DIAGNOSIS — R569 Unspecified convulsions: Secondary | ICD-10-CM | POA: Diagnosis not present

## 2020-07-09 DIAGNOSIS — I10 Essential (primary) hypertension: Secondary | ICD-10-CM | POA: Diagnosis not present

## 2020-07-09 DIAGNOSIS — Z8679 Personal history of other diseases of the circulatory system: Secondary | ICD-10-CM | POA: Diagnosis not present

## 2020-07-09 DIAGNOSIS — Z125 Encounter for screening for malignant neoplasm of prostate: Secondary | ICD-10-CM | POA: Diagnosis not present

## 2020-07-09 DIAGNOSIS — Z0000 Encounter for general adult medical examination without abnormal findings: Secondary | ICD-10-CM | POA: Diagnosis not present

## 2020-07-09 DIAGNOSIS — D692 Other nonthrombocytopenic purpura: Secondary | ICD-10-CM | POA: Diagnosis not present

## 2020-07-09 DIAGNOSIS — E7800 Pure hypercholesterolemia, unspecified: Secondary | ICD-10-CM | POA: Diagnosis not present

## 2020-07-22 ENCOUNTER — Encounter: Payer: Self-pay | Admitting: Adult Health

## 2020-07-22 ENCOUNTER — Ambulatory Visit (INDEPENDENT_AMBULATORY_CARE_PROVIDER_SITE_OTHER): Payer: Medicare Other | Admitting: Adult Health

## 2020-07-22 VITALS — BP 131/77 | HR 58 | Ht 71.0 in | Wt 167.0 lb

## 2020-07-22 DIAGNOSIS — I639 Cerebral infarction, unspecified: Secondary | ICD-10-CM

## 2020-07-22 DIAGNOSIS — R569 Unspecified convulsions: Secondary | ICD-10-CM

## 2020-07-22 DIAGNOSIS — I10 Essential (primary) hypertension: Secondary | ICD-10-CM

## 2020-07-22 DIAGNOSIS — E785 Hyperlipidemia, unspecified: Secondary | ICD-10-CM

## 2020-07-22 DIAGNOSIS — I609 Nontraumatic subarachnoid hemorrhage, unspecified: Secondary | ICD-10-CM

## 2020-07-22 MED ORDER — LEVETIRACETAM 500 MG PO TABS: 500.0000 mg | ORAL_TABLET | Freq: Two times a day (BID) | ORAL | 3 refills | Status: DC

## 2020-07-22 NOTE — Progress Notes (Signed)
[facility] [address] [place]. [place] [address] [phone]       STROKE FOLLOW UP NOTE  Thomas Norman Date of Birth:  28-Jan-1951 Medical Record Number:  [id number]   Reason for Referral: stroke follow up    CHIEF COMPLAINT:  Chief Complaint  Patient presents with  . Follow-up    tx rm  . Cerebrovascular Accident    pt has no new sx    HPI:  Today, 07/22/2020, Thomas Norman is being seen for stroke follow-up.  He has been doing well since prior visit without new or reoccurring stroke/TIA symptoms.  He remains on Keppra 500 mg twice daily tolerating well without seizure activity.  Blood pressure today 131/77.  He does monitor at home and typically 110-120/80s.  Reports recent physical with PCP with lab work all satisfactory.  No concerns at this time.   History provided for reference purposes only Update 02/10/2020 JM: Thomas Norman returns for follow-up regarding left parietal ICH in 06/2019.  He has been doing well since prior visit without residual stroke deficits or new/reoccurring stroke/TIA symptoms.  He does note occasional short-term memory issues prior to his stroke and has noticed improvements of short-term memory after his stroke.  He also endorses prior episodes of transient confusion lasting approximately 30 minutes and then subsiding.  He reports 3 prior episodes with last episode occurring approximately 3 years prior to his stroke.  He had no work-up or evaluation for those episodes previously.  Denies any recurrent episodes or concerns.  Repeat MRI w/wo contrast 11/12/2019 again did not show any abnormalities or significant change from prior MRI.  Continues on Keppra 500 mg twice daily tolerating well without recurrent seizure activity.  Blood pressure today 122/70.  Reports recent lab work by PCP which did show slightly elevated lipid panel and has restarted on omega-3 and plans on repeating lipid panel PCP in the fall.  No concerns at this  time.  Update 10/09/2019: Thomas Norman is a 69 year old male who is being seen today for stroke follow-up.  He has been doing well from a stroke standpoint without residual deficits or reoccurring/new stroke/TIA symptoms.  MRI w/wo contrast was done on 09/07/2019 which did not show any underlying abnormalities for possible cause/etiology of recent hemorrhage.  Also repeated EEG which was normal.  He was evaluated by [facility] neurology for second opinion regarding left parietal ICH etiology.  It was recommended by Dr. Patsey Berthold to undergo catheter angiogram for further evaluation.  Angiogram unremarkable for evidence of AVMs or abnormalities.  He continues to be stable from a neurological standpoint without residual deficits.  He continues on Keppra tolerating well without reoccurring seizure activity.  Continues to monitor blood pressure at home and typically range 100-1 29/70 to 80s.  Patient questions etiology of hemorrhage which was primarily discussed during visit.  He does endorse after his fall, he immediately returned to trying to pull his trailer using increased exertion and continued increased exertion up until he presented to ED.  He also endorses possible worsening cognition/memory as he uses a calendar to write dates are important information down typically 1 day/week but he noticed after his fall, he was writing something on his calendar daily.  He also reports upon his symptom onset of increased confusion and aphasia, he took 2x 325 mg tablets of aspirin as he believes he was having a stroke.  Stroke admission 07/13/2019: [name] is a 69 y.o. male with history of HTN and HLD  presented on 07/13/2019 with aphasia and confusion.  Witnessed tonic-clonic seizure activity in ED lobby.    Stroke work-up revealed small acute left parietal ICH as evidenced on CT head and MRI likely related to hypertension.  He did not receive IV t-PA due to [place].  MRI did not show evidence of underlying mass lesion, AVM  or CAA but did show mild scattered subarachnoid hemorrhage seen within the adjacent left frontal-occipital region.  CTA head/neck unremarkable.  LDL 95.  A1c 5.2.  Witnessed tonic-clonic seizure while in ER with EEG showing evidence of epileptogenicity in left frontal area along with severe diffuse encephalopathy likely secondary to sedated state during EEG.  Elevated blood pressure post seizure activity but otherwise stable during admission.  Loaded with Keppra and recommended continuation at discharge for seizure prophylaxis.  HTN and HLD stable.  Other stroke risk factors include advanced age and EtOH use but no prior history of stroke.  Discharged home in stable condition recommendation of outpatient PT/OT.  Initial visit 08/19/2019 JM: Thomas Norman is a 69 year old male who is being seen today for stroke follow-up accompanied by his wife.  He has been doing well since discharge with complete resolution of residual deficits.  He was initially participating in outpatient PT/ST and has since been discharged.  He has not returned back to prior activities as he was not sure of any restrictions and fear of reoccurring stroke or worsening of bleed.  He does endorse roughly 10 days prior to symptom onset, he was attempting to move a loaded trailer but his hand slipped off the jack and fell backwards "with considerable force" hitting the back portion of his head.  He denies loss of consciousness but reports "worst head "I ever remember having".  He did experience pain for approximately 30 minutes but then resolved and did not experience any headache, vision loss, weakness, AMS or speech difficulty until he presented to the ED.  Blood pressure has always been well managed typically ranging 110-120/70s.  Blood pressure today 116/70.  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  He has remained on Keppra 500 mg twice daily without side effects or  reoccurring seizure activity.  He does have a history of easy bruising noticeably greater distal extremities upper>lower.  He ensures avoidance of aspirin, aspirin-containing products or ibuprofen products. no further concerns at this time.        ROS:   14 system review of systems performed and negative with exception of see HPI  PMH:  Past Medical History:  Diagnosis Date  . Hypertension   . Kidney calculi     PSH:  Past Surgical History:  Procedure Laterality Date  . LITHOTRIPSY    . skin cancers    . TONSILLECTOMY      Social History:  Social History   Socioeconomic History  . Marital status: Married    Spouse name: Not on file  . Number of children: Not on file  . Years of education: Not on file  . Highest education level: Not on file  Occupational History  . Not on file  Tobacco Use  . Smoking status: Never Smoker  . Smokeless tobacco: Never Used  Substance and Sexual Activity  . Alcohol use: Yes    Alcohol/week: 1.0 standard drink    Types: 1 Glasses of wine per week  . Drug use: No  . Sexual activity: Yes  Other Topics Concern  . Not  on file  Social History Narrative  . Not on file   Social Determinants of Health   Financial Resource Strain:   . Difficulty of Paying Living Expenses: Not on file  Food Insecurity:   . Worried About Charity fundraiser in the Last Year: Not on file  . Ran Out of Food in the Last Year: Not on file  Transportation Needs:   . Lack of Transportation (Medical): Not on file  . Lack of Transportation (Non-Medical): Not on file  Physical Activity:   . Days of Exercise per Week: Not on file  . Minutes of Exercise per Session: Not on file  Stress:   . Feeling of Stress : Not on file  Social Connections:   . Frequency of Communication with Friends and Family: Not on file  . Frequency of Social Gatherings with Friends and Family: Not on file  . Attends Religious Services: Not on file  . Active Member of Clubs or  Organizations: Not on file  . Attends Archivist Meetings: Not on file  . Marital Status: Not on file  Intimate Partner Violence:   . Fear of Current or Ex-Partner: Not on file  . Emotionally Abused: Not on file  . Physically Abused: Not on file  . Sexually Abused: Not on file    Family History:  Family History  Problem Relation Age of Onset  . Diabetes Mother   . Dementia Mother     Medications:   Current Outpatient Medications on File Prior to Visit  Medication Sig Dispense Refill  . Coenzyme Q10 (CO Q 10 PO) Take 1 tablet by mouth daily.    Marland Kitchen levETIRAcetam (KEPPRA) 500 MG tablet Take 1 tablet (500 mg total) by mouth 2 (two) times daily. 180 tablet 3  . Multiple Vitamins-Minerals (VITAMIN D3 COMPLETE PO) Take by mouth.    . Omega-3 Fatty Acids (FISH OIL) 1000 MG CAPS Take by mouth 3 (three) times a week.    Marland Kitchen amLODipine (NORVASC) 2.5 MG tablet Take 1 tablet (2.5 mg total) by mouth daily. 30 tablet 11  . metoprolol tartrate (LOPRESSOR) 25 MG tablet Take 1 tablet (25 mg total) by mouth 2 (two) times daily. 60 tablet 11   No current facility-administered medications on file prior to visit.    Allergies:   Allergies  Allergen Reactions  . Sulfonamide Derivatives Rash     Physical Exam  Vitals:   07/22/20 1340  BP: 131/77  Pulse: (!) 58  Weight: 167 lb (75.8 kg)  Height: 5\' 11"  (1.803 m)   Body mass index is 23.29 kg/m. No exam data present  General: well developed, well nourished, pleasant middle-age Caucasian male, seated, in no evident distress Head: head normocephalic and atraumatic.   Neck: supple with no carotid or supraclavicular bruits Cardiovascular: regular rate and rhythm, no murmurs Musculoskeletal: no deformity Skin:  no rash/petichiae Vascular:  Normal pulses all extremities   Neurologic Exam Mental Status: Awake and fully alert.   Fluent speech and language.  Oriented to place and time. Recent and remote memory intact. Attention span,  concentration and fund of knowledge appropriate. Mood and affect appropriate.  Cranial Nerves: Pupils equal, briskly reactive to light. Extraocular movements full without nystagmus. Visual fields full to confrontation. Hearing intact. Facial sensation intact. Face, tongue, palate moves normally and symmetrically.  Motor: Normal bulk and tone. Normal strength in all tested extremity muscles. Sensory.: intact to touch , pinprick , position and vibratory sensation.  Coordination: Rapid alternating movements  normal in all extremities. Finger-to-nose and heel-to-shin performed accurately bilaterally. Gait and Station: Arises from chair without difficulty. Stance is normal. Gait demonstrates normal stride length and balance Reflexes: 1+ and symmetric. Toes downgoing.         ASSESSMENT: JOELLE FLESSNER is a 69 y.o. year old male presented with aphasia, confusion, agitation and elevated BP (post ictal)  on 07/13/2019 with stroke work-up revealing small acute left parietal ICH and mild scattered subarachnoid hemorrhage left parietal-occipital region with subsequent tonic-clonic seizure. Vascular risk factors include HTN and HLD.  MRI W/WO contrast x2 negative for underlying abnormalities.  He was evaluated by [facility] neurology for second opinion regarding hemorrhage etiology which remained indeterminate.      PLAN:  1. Left parietal ICH/parietal-occipital SAH:  a. Recovered well without residual deficits b. No indication for antithrombotic therefore advised to avoid aspirin, aspirin-containing products and ibuprofen products.  Continue fish oil for secondary stroke prevention.  c. Maintain strict control of hypertension with blood pressure goal below 130/90, diabetes with hemoglobin A1c goal below 6.5% and cholesterol with LDL cholesterol (bad cholesterol) goal below 70 mg/dL.  I also advised the patient to eat a healthy diet with plenty of whole grains, cereals, fruits and vegetables, exercise regularly  with at least 30 minutes of continuous activity daily and maintain ideal body weight.   2. Seizure, post ICH:  a. Stable without reoccurring seizure events b. Continuation of Keppra 500 mg twice daily for seizure prophylaxis.  Refill provided.  3. HTN:  a. Stable.  Continue to monitor at home and ongoing follow-up with PCP for monitoring and management 4. HLD:  a. Continue fish oil and ongoing follow-up with PCP for monitoring management   Follow up in 6 months or call earlier if needed   I spent 30 minutes of face-to-face and non-face-to-face time with patient.  This included previsit chart review, lab review, study review, order entry, electronic health record documentation, patient education regarding ICH, importance of managing stroke risk factors, post ICH seizure and answered all questions to patient satisfaction   Frann Rider, [facility]  [facility] [address] [place], [place] [address]  Phone [phone] Fax [phone] Note: This document was prepared with digital dictation and possible smart phrase technology. Any transcriptional errors that result from this process are unintentional.

## 2020-07-23 NOTE — Progress Notes (Signed)
I agree with the above plan 

## 2020-12-16 DIAGNOSIS — D2272 Melanocytic nevi of left lower limb, including hip: Secondary | ICD-10-CM | POA: Diagnosis not present

## 2020-12-16 DIAGNOSIS — Z85820 Personal history of malignant melanoma of skin: Secondary | ICD-10-CM | POA: Diagnosis not present

## 2020-12-16 DIAGNOSIS — D2271 Melanocytic nevi of right lower limb, including hip: Secondary | ICD-10-CM | POA: Diagnosis not present

## 2020-12-16 DIAGNOSIS — L821 Other seborrheic keratosis: Secondary | ICD-10-CM | POA: Diagnosis not present

## 2020-12-16 DIAGNOSIS — L820 Inflamed seborrheic keratosis: Secondary | ICD-10-CM | POA: Diagnosis not present

## 2020-12-16 DIAGNOSIS — D692 Other nonthrombocytopenic purpura: Secondary | ICD-10-CM | POA: Diagnosis not present

## 2020-12-16 DIAGNOSIS — D224 Melanocytic nevi of scalp and neck: Secondary | ICD-10-CM | POA: Diagnosis not present

## 2020-12-16 DIAGNOSIS — L570 Actinic keratosis: Secondary | ICD-10-CM | POA: Diagnosis not present

## 2020-12-16 DIAGNOSIS — D1801 Hemangioma of skin and subcutaneous tissue: Secondary | ICD-10-CM | POA: Diagnosis not present

## 2020-12-16 DIAGNOSIS — Z85828 Personal history of other malignant neoplasm of skin: Secondary | ICD-10-CM | POA: Diagnosis not present

## 2021-01-11 DIAGNOSIS — R972 Elevated prostate specific antigen [PSA]: Secondary | ICD-10-CM | POA: Diagnosis not present

## 2021-01-11 DIAGNOSIS — Z125 Encounter for screening for malignant neoplasm of prostate: Secondary | ICD-10-CM | POA: Diagnosis not present

## 2021-01-20 ENCOUNTER — Ambulatory Visit: Payer: Medicare Other | Admitting: Adult Health

## 2021-01-20 ENCOUNTER — Telehealth: Payer: Self-pay | Admitting: Adult Health

## 2021-01-20 NOTE — Telephone Encounter (Signed)
FYI: Pt called, cancelling appt today, having jury duty. Will reschedule after trial is finished.

## 2021-01-20 NOTE — Progress Notes (Deleted)
[facility] [address] [place]. [place] [address] [phone]       STROKE FOLLOW UP NOTE  [name] Date of Birth:  04-Jul-1951 Medical Record Number:  [id number]   Reason for Referral: stroke follow up    CHIEF COMPLAINT:  No chief complaint on file.   HPI:  Today, 01/20/2021, [name] returns for 32-month stroke and seizure follow-up  Stable from stroke standpoint without new or recurring stroke/TIA symptoms Blood pressure today *** on amlodipine 2.5 mg daily and metoprolol 25 mg twice daily  Remains on Keppra 500 mg twice daily without associated side effects Denies any reoccurring seizure activity      History provided for reference purposes only Update 07/22/2020 JM: [name] is being seen for stroke follow-up.  He has been doing well since prior visit without new or reoccurring stroke/TIA symptoms.  He remains on Keppra 500 mg twice daily tolerating well without seizure activity.  Blood pressure today 131/77.  He does monitor at home and typically 110-120/80s.  Reports recent physical with PCP with lab work all satisfactory.  No concerns at this time.  Update 02/10/2020 JM: [name] returns for follow-up regarding left parietal ICH in 06/2019.  He has been doing well since prior visit without residual stroke deficits or new/reoccurring stroke/TIA symptoms.  He does note occasional short-term memory issues prior to his stroke and has noticed improvements of short-term memory after his stroke.  He also endorses prior episodes of transient confusion lasting approximately 30 minutes and then subsiding.  He reports 3 prior episodes with last episode occurring approximately 3 years prior to his stroke.  He had no work-up or evaluation for those episodes previously.  Denies any recurrent episodes or concerns.  Repeat MRI w/wo contrast 11/12/2019 again did not show any abnormalities or significant change from prior MRI.  Continues on Keppra 500 mg  twice daily tolerating well without recurrent seizure activity.  Blood pressure today 122/70.  Reports recent lab work by PCP which did show slightly elevated lipid panel and has restarted on omega-3 and plans on repeating lipid panel PCP in the fall.  No concerns at this time.  Update 10/09/2019: [name] is a 70 year old male who is being seen today for stroke follow-up.  He has been doing well from a stroke standpoint without residual deficits or reoccurring/new stroke/TIA symptoms.  MRI w/wo contrast was done on 09/07/2019 which did not show any underlying abnormalities for possible cause/etiology of recent hemorrhage.  Also repeated EEG which was normal.  He was evaluated by [facility] neurology for second opinion regarding left parietal ICH etiology.  It was recommended by Dr. Patsey Berthold to undergo catheter angiogram for further evaluation.  Angiogram unremarkable for evidence of AVMs or abnormalities.  He continues to be stable from a neurological standpoint without residual deficits.  He continues on Keppra tolerating well without reoccurring seizure activity.  Continues to monitor blood pressure at home and typically range 100-1 29/70 to 80s.  Patient questions etiology of hemorrhage which was primarily discussed during visit.  He does endorse after his fall, he immediately returned to trying to pull his trailer using increased exertion and continued increased exertion up until he presented to ED.  He also endorses possible worsening cognition/memory as he uses a calendar to write dates are important information down typically 1 day/week but he noticed after his fall, he was writing something on his calendar daily.  He also reports upon his symptom onset of increased confusion  and aphasia, he took 2x 325 mg tablets of aspirin as he believes he was having a stroke.  Stroke admission 07/13/2019: [name] is a 70 y.o. male with history of HTN and HLD  presented on 07/13/2019 with aphasia and confusion.   Witnessed tonic-clonic seizure activity in ED lobby.    Stroke work-up revealed small acute left parietal ICH as evidenced on CT head and MRI likely related to hypertension.  He did not receive IV t-PA due to [place].  MRI did not show evidence of underlying mass lesion, AVM or CAA but did show mild scattered subarachnoid hemorrhage seen within the adjacent left frontal-occipital region.  CTA head/neck unremarkable.  LDL 95.  A1c 5.2.  Witnessed tonic-clonic seizure while in ER with EEG showing evidence of epileptogenicity in left frontal area along with severe diffuse encephalopathy likely secondary to sedated state during EEG.  Elevated blood pressure post seizure activity but otherwise stable during admission.  Loaded with Keppra and recommended continuation at discharge for seizure prophylaxis.  HTN and HLD stable.  Other stroke risk factors include advanced age and EtOH use but no prior history of stroke.  Discharged home in stable condition recommendation of outpatient PT/OT.  Initial visit 08/19/2019 JM: [name] is a 70 year old male who is being seen today for stroke follow-up accompanied by his wife.  He has been doing well since discharge with complete resolution of residual deficits.  He was initially participating in outpatient PT/ST and has since been discharged.  He has not returned back to prior activities as he was not sure of any restrictions and fear of reoccurring stroke or worsening of bleed.  He does endorse roughly 10 days prior to symptom onset, he was attempting to move a loaded trailer but his hand slipped off the jack and fell backwards "with considerable force" hitting the back portion of his head.  He denies loss of consciousness but reports "worst head "I ever remember having".  He did experience pain for approximately 30 minutes but then resolved and did not experience any headache, vision loss, weakness, AMS or speech difficulty until he presented to the ED.  Blood pressure has  always been well managed typically ranging 110-120/70s.  Blood pressure today 116/70.  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  He has remained on Keppra 500 mg twice daily without side effects or reoccurring seizure activity.  He does have a history of easy bruising noticeably greater distal extremities upper>lower.  He ensures avoidance of aspirin, aspirin-containing products or ibuprofen products. no further concerns at this time.        ROS:   14 system review of systems performed and negative with exception of see HPI  PMH:  Past Medical History:  Diagnosis Date  . Hypertension   . Kidney calculi     PSH:  Past Surgical History:  Procedure Laterality Date  . LITHOTRIPSY    . skin cancers    . TONSILLECTOMY      Social History:  Social History   Socioeconomic History  . Marital status: Married    Spouse name: Not on file  . Number of children: Not on file  . Years of education: Not on file  . Highest education level: Not on file  Occupational History  . Not on file  Tobacco Use  . Smoking status: Never Smoker  . Smokeless tobacco: Never Used  Substance and Sexual Activity  .  Alcohol use: Yes    Alcohol/week: 1.0 standard drink    Types: 1 Glasses of wine per week  . Drug use: No  . Sexual activity: Yes  Other Topics Concern  . Not on file  Social History Narrative  . Not on file   Social Determinants of Health   Financial Resource Strain: Not on file  Food Insecurity: Not on file  Transportation Needs: Not on file  Physical Activity: Not on file  Stress: Not on file  Social Connections: Not on file  Intimate Partner Violence: Not on file    Family History:  Family History  Problem Relation Age of Onset  . Diabetes Mother   . Dementia Mother     Medications:   Current Outpatient Medications on File Prior to Visit  Medication Sig Dispense Refill  . amLODipine  (NORVASC) 2.5 MG tablet Take 1 tablet (2.5 mg total) by mouth daily. 30 tablet 11  . Coenzyme Q10 (CO Q 10 PO) Take 1 tablet by mouth daily.    Marland Kitchen levETIRAcetam (KEPPRA) 500 MG tablet Take 1 tablet (500 mg total) by mouth 2 (two) times daily. 180 tablet 3  . metoprolol tartrate (LOPRESSOR) 25 MG tablet Take 1 tablet (25 mg total) by mouth 2 (two) times daily. 60 tablet 11  . Multiple Vitamins-Minerals (VITAMIN D3 COMPLETE PO) Take by mouth.    . Omega-3 Fatty Acids (FISH OIL) 1000 MG CAPS Take by mouth 3 (three) times a week.     No current facility-administered medications on file prior to visit.    Allergies:   Allergies  Allergen Reactions  . Sulfonamide Derivatives Rash     Physical Exam  There were no vitals filed for this visit. There is no height or weight on file to calculate BMI. No exam data present  General: well developed, well nourished, pleasant middle-age Caucasian male, seated, in no evident distress Head: head normocephalic and atraumatic.   Neck: supple with no carotid or supraclavicular bruits Cardiovascular: regular rate and rhythm, no murmurs Musculoskeletal: no deformity Skin:  no rash/petichiae Vascular:  Normal pulses all extremities   Neurologic Exam Mental Status: Awake and fully alert.   Fluent speech and language.  Oriented to place and time. Recent and remote memory intact. Attention span, concentration and fund of knowledge appropriate. Mood and affect appropriate.  Cranial Nerves: Pupils equal, briskly reactive to light. Extraocular movements full without nystagmus. Visual fields full to confrontation. Hearing intact. Facial sensation intact. Face, tongue, palate moves normally and symmetrically.  Motor: Normal bulk and tone. Normal strength in all tested extremity muscles. Sensory.: intact to touch , pinprick , position and vibratory sensation.  Coordination: Rapid alternating movements normal in all extremities. Finger-to-nose and heel-to-shin  performed accurately bilaterally. Gait and Station: Arises from chair without difficulty. Stance is normal. Gait demonstrates normal stride length and balance Reflexes: 1+ and symmetric. Toes downgoing.         ASSESSMENT: Thomas Norman is a 70 y.o. year old male presented with aphasia, confusion, agitation and elevated BP (post ictal)  on 07/13/2019 with stroke work-up revealing small acute left parietal ICH and mild scattered subarachnoid hemorrhage left parietal-occipital region with subsequent tonic-clonic seizure. Vascular risk factors include HTN and HLD.  MRI W/WO contrast x2 negative for underlying abnormalities.  He was evaluated by [facility] neurology for second opinion regarding hemorrhage etiology which remained indeterminate.      PLAN:  1. Left parietal ICH/parietal-occipital SAH:  a. Recovered well without residual deficits b.  No indication for antithrombotic therefore advised to avoid aspirin, aspirin-containing products and ibuprofen products.  Continue fish oil for secondary stroke prevention.  c. Maintain strict control of hypertension with blood pressure goal below 130/90, diabetes with hemoglobin A1c goal below 6.5% and cholesterol with LDL cholesterol (bad cholesterol) goal below 70 mg/dL.  2. Seizure, post ICH:  a. Stable without reoccurring seizure events b. Continuation of Keppra 500 mg twice daily for seizure prophylaxis.  Refill provided.  c. Discussed avoidance of seizure provoking triggers and to call office with any seizure activity or concerns    Follow up in 6 months or call earlier if needed   CC:  [place] provider: Dr. Milus Banister, Herbie Baltimore, MD    I spent 30 minutes of face-to-face and non-face-to-face time with patient.  This included previsit chart review, lab review, study review, order entry, electronic health record documentation, patient education regarding ICH, importance of managing stroke risk factors, post ICH seizure and answered all questions  to patient satisfaction   Frann Rider, [facility]  [facility] [address] [place], [place] [address]  Phone [phone] Fax [phone] Note: This document was prepared with digital dictation and possible smart phrase technology. Any transcriptional errors that result from this process are unintentional.

## 2021-01-20 NOTE — Telephone Encounter (Signed)
Noted  

## 2021-01-27 DIAGNOSIS — Z961 Presence of intraocular lens: Secondary | ICD-10-CM | POA: Diagnosis not present

## 2021-01-27 DIAGNOSIS — H35373 Puckering of macula, bilateral: Secondary | ICD-10-CM | POA: Diagnosis not present

## 2021-02-01 ENCOUNTER — Ambulatory Visit: Payer: Medicare Other | Admitting: Adult Health

## 2021-02-02 ENCOUNTER — Ambulatory Visit (INDEPENDENT_AMBULATORY_CARE_PROVIDER_SITE_OTHER): Payer: Medicare Other | Admitting: Adult Health

## 2021-02-02 ENCOUNTER — Encounter: Payer: Self-pay | Admitting: Adult Health

## 2021-02-02 VITALS — BP 135/77 | HR 58 | Ht 70.0 in | Wt 168.0 lb

## 2021-02-02 DIAGNOSIS — I609 Nontraumatic subarachnoid hemorrhage, unspecified: Secondary | ICD-10-CM | POA: Diagnosis not present

## 2021-02-02 DIAGNOSIS — Z5181 Encounter for therapeutic drug level monitoring: Secondary | ICD-10-CM | POA: Diagnosis not present

## 2021-02-02 DIAGNOSIS — I639 Cerebral infarction, unspecified: Secondary | ICD-10-CM

## 2021-02-02 DIAGNOSIS — R569 Unspecified convulsions: Secondary | ICD-10-CM

## 2021-02-02 NOTE — Patient Instructions (Addendum)
No changes at this time - continue current treatment plan  Repeat imaging of your brain for surveillance monitoring  Obtain CBC today with ongoing use of Keppra    Follow up in 6 months or call earlier if needed

## 2021-02-02 NOTE — Progress Notes (Signed)
I agree with the above plan 

## 2021-02-02 NOTE — Progress Notes (Signed)
[facility] [address] [place]. [place] [address] [phone]       STROKE FOLLOW UP NOTE  [name] Date of Birth:  Aug 02, 1951 Medical Record Number:  [id number]   Reason for Referral: stroke follow up    CHIEF COMPLAINT:  Chief Complaint  Patient presents with  . Follow-up    Rm 14 alone Pt is well, doing great with no complaints     HPI:  Today, 02/02/2021, Thomas Norman returns for 57-month stroke and seizure follow-up  Stable from stroke standpoint without new or recurring stroke/TIA symptoms Blood pressure today 135/77 on amlodipine 2.5 mg daily and metoprolol 25 mg twice daily. Monitors at home and typically 110-120s/70s-80s.  Remains on Keppra 500 mg twice daily tolerating without side effects without any recent seizure activity Remains active maintaining all ADLs and IADLs independently He does report chronic history of easy bruising more so upper arms   No further concerns at this time    History provided for reference purposes only Update 07/22/2020 JM: Thomas Norman is being seen for stroke follow-up.  He has been doing well since prior visit without new or reoccurring stroke/TIA symptoms.  He remains on Keppra 500 mg twice daily tolerating well without seizure activity.  Blood pressure today 131/77.  He does monitor at home and typically 110-120/80s.  Reports recent physical with PCP with lab work all satisfactory.  No concerns at this time.  Update 02/10/2020 JM: Thomas Norman returns for follow-up regarding left parietal ICH in 06/2019.  He has been doing well since prior visit without residual stroke deficits or new/reoccurring stroke/TIA symptoms.  He does note occasional short-term memory issues prior to his stroke and has noticed improvements of short-term memory after his stroke.  He also endorses prior episodes of transient confusion lasting approximately 30 minutes and then subsiding.  He reports 3 prior episodes with last episode  occurring approximately 3 years prior to his stroke.  He had no work-up or evaluation for those episodes previously.  Denies any recurrent episodes or concerns.  Repeat MRI w/wo contrast 11/12/2019 again did not show any abnormalities or significant change from prior MRI.  Continues on Keppra 500 mg twice daily tolerating well without recurrent seizure activity.  Blood pressure today 122/70.  Reports recent lab work by PCP which did show slightly elevated lipid panel and has restarted on omega-3 and plans on repeating lipid panel PCP in the fall.  No concerns at this time.  Update 10/09/2019: Thomas Norman is a 70 year old male who is being seen today for stroke follow-up.  He has been doing well from a stroke standpoint without residual deficits or reoccurring/new stroke/TIA symptoms.  MRI w/wo contrast was done on 09/07/2019 which did not show any underlying abnormalities for possible cause/etiology of recent hemorrhage.  Also repeated EEG which was normal.  He was evaluated by [facility] neurology for second opinion regarding left parietal ICH etiology.  It was recommended by Dr. Patsey Berthold to undergo catheter angiogram for further evaluation.  Angiogram unremarkable for evidence of AVMs or abnormalities.  He continues to be stable from a neurological standpoint without residual deficits.  He continues on Keppra tolerating well without reoccurring seizure activity.  Continues to monitor blood pressure at home and typically range 100-1 29/70 to 80s.  Patient questions etiology of hemorrhage which was primarily discussed during visit.  He does endorse after his fall, he immediately returned to trying to pull his trailer using increased exertion and continued increased exertion up  until he presented to ED.  He also endorses possible worsening cognition/memory as he uses a calendar to write dates are important information down typically 1 day/week but he noticed after his fall, he was writing something on his calendar daily.   He also reports upon his symptom onset of increased confusion and aphasia, he took 2x 325 mg tablets of aspirin as he believes he was having a stroke.  Stroke admission 07/13/2019: Thomas Norman is a 70 y.o. male with history of HTN and HLD  presented on 07/13/2019 with aphasia and confusion.  Witnessed tonic-clonic seizure activity in ED lobby.    Stroke work-up revealed small acute left parietal ICH as evidenced on CT head and MRI likely related to hypertension.  He did not receive IV t-PA due to [place].  MRI did not show evidence of underlying mass lesion, AVM or CAA but did show mild scattered subarachnoid hemorrhage seen within the adjacent left frontal-occipital region.  CTA head/neck unremarkable.  LDL 95.  A1c 5.2.  Witnessed tonic-clonic seizure while in ER with EEG showing evidence of epileptogenicity in left frontal area along with severe diffuse encephalopathy likely secondary to sedated state during EEG.  Elevated blood pressure post seizure activity but otherwise stable during admission.  Loaded with Keppra and recommended continuation at discharge for seizure prophylaxis.  HTN and HLD stable.  Other stroke risk factors include advanced age and EtOH use but no prior history of stroke.  Discharged home in stable condition recommendation of outpatient PT/OT.  Initial visit 08/19/2019 JM: Thomas Norman is a 70 year old male who is being seen today for stroke follow-up accompanied by his wife.  He has been doing well since discharge with complete resolution of residual deficits.  He was initially participating in outpatient PT/ST and has since been discharged.  He has not returned back to prior activities as he was not sure of any restrictions and fear of reoccurring stroke or worsening of bleed.  He does endorse roughly 10 days prior to symptom onset, he was attempting to move a loaded trailer but his hand slipped off the jack and fell backwards "with considerable force" hitting the back portion of his  head.  He denies loss of consciousness but reports "worst head "I ever remember having".  He did experience pain for approximately 30 minutes but then resolved and did not experience any headache, vision loss, weakness, AMS or speech difficulty until he presented to the ED.  Blood pressure has always been well managed typically ranging 110-120/70s.  Blood pressure today 116/70.  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  He has remained on Keppra 500 mg twice daily without side effects or reoccurring seizure activity.  He does have a history of easy bruising noticeably greater distal extremities upper>lower.  He ensures avoidance of aspirin, aspirin-containing products or ibuprofen products. no further concerns at this time.        ROS:   14 system review of systems performed and negative with exception of see HPI  PMH:  Past Medical History:  Diagnosis Date  . Hypertension   . Kidney calculi     PSH:  Past Surgical History:  Procedure Laterality Date  . LITHOTRIPSY    . skin cancers    . TONSILLECTOMY      Social History:  Social History   Socioeconomic History  . Marital status: Married    Spouse name: Not on file  .  Number of children: Not on file  . Years of education: Not on file  . Highest education level: Not on file  Occupational History  . Not on file  Tobacco Use  . Smoking status: Never Smoker  . Smokeless tobacco: Never Used  Substance and Sexual Activity  . Alcohol use: Yes    Alcohol/week: 1.0 standard drink    Types: 1 Glasses of wine per week  . Drug use: No  . Sexual activity: Yes  Other Topics Concern  . Not on file  Social History Narrative  . Not on file   Social Determinants of Health   Financial Resource Strain: Not on file  Food Insecurity: Not on file  Transportation Needs: Not on file  Physical Activity: Not on file  Stress: Not on file  Social Connections:  Not on file  Intimate Partner Violence: Not on file    Family History:  Family History  Problem Relation Age of Onset  . Diabetes Mother   . Dementia Mother     Medications:   Current Outpatient Medications on File Prior to Visit  Medication Sig Dispense Refill  . amLODipine (NORVASC) 2.5 MG tablet Take 1 tablet (2.5 mg total) by mouth daily. 30 tablet 11  . Coenzyme Q10 (CO Q 10 PO) Take 1 tablet by mouth daily.    Marland Kitchen levETIRAcetam (KEPPRA) 500 MG tablet Take 1 tablet (500 mg total) by mouth 2 (two) times daily. 180 tablet 3  . metoprolol tartrate (LOPRESSOR) 25 MG tablet Take 1 tablet (25 mg total) by mouth 2 (two) times daily. 60 tablet 11  . Multiple Vitamins-Minerals (VITAMIN D3 COMPLETE PO) Take by mouth.    . Omega-3 Fatty Acids (FISH OIL) 1000 MG CAPS Take by mouth 3 (three) times a week.     No current facility-administered medications on file prior to visit.    Allergies:   Allergies  Allergen Reactions  . Sulfonamide Derivatives Rash     Physical Exam  Vitals:   02/02/21 1340  BP: 135/77  Pulse: (!) 58  Weight: 168 lb (76.2 kg)  Height: 5\' 10"  (1.778 m)   Body mass index is 24.11 kg/m. No exam data present  General: well developed, well nourished, pleasant middle-age Caucasian male, seated, in no evident distress Head: head normocephalic and atraumatic.   Neck: supple with no carotid or supraclavicular bruits Cardiovascular: regular rate and rhythm, no murmurs Musculoskeletal: no deformity Skin:  no rash/petichiae Vascular:  Normal pulses all extremities   Neurologic Exam Mental Status: Awake and fully alert.   Fluent speech and language.  Oriented to place and time. Recent and remote memory intact. Attention span, concentration and fund of knowledge appropriate. Mood and affect appropriate.  Cranial Nerves: Pupils equal, briskly reactive to light. Extraocular movements full without nystagmus. Visual fields full to confrontation. Hearing intact. Facial  sensation intact. Face, tongue, palate moves normally and symmetrically.  Motor: Normal bulk and tone. Normal strength in all tested extremity muscles. Sensory.: intact to touch , pinprick , position and vibratory sensation.  Coordination: Rapid alternating movements normal in all extremities. Finger-to-nose and heel-to-shin performed accurately bilaterally. Gait and Station: Arises from chair without difficulty. Stance is normal. Gait demonstrates normal stride length and balance Reflexes: 1+ and symmetric. Toes downgoing.         ASSESSMENT: Thomas Norman is a 70 y.o. year old male presented with aphasia, confusion, agitation and elevated BP (post ictal)  on 07/13/2019 with stroke work-up revealing small acute left parietal  ICH and mild scattered subarachnoid hemorrhage left parietal-occipital region with subsequent tonic-clonic seizure. Vascular risk factors include HTN and HLD.  MRI W/WO contrast x2 negative for underlying abnormalities.  He was evaluated by [facility] neurology for second opinion regarding hemorrhage etiology which remained indeterminate.      PLAN:  1. Left parietal ICH/parietal-occipital SAH:  a. Recovered well without residual deficits b. No indication for antithrombotic therefore advised to avoid aspirin, aspirin-containing products and ibuprofen products.  Continue fish oil for secondary stroke prevention.  c. Repeat MR brain and MRA head for surveillance monitoring and secondary stroke prevention d. Maintain strict control of hypertension with blood pressure goal below 130/90  and cholesterol with LDL cholesterol (bad cholesterol) goal below 70 mg/dL.  2. Seizure, post ICH:  a. Stable without reoccurring seizure events b. Continuation of Keppra 500 mg twice daily for seizure prophylaxis. Refill UTD c. Discussed avoidance of seizure provoking triggers and to call office with any seizure activity or concerns d. Obtain CBC as unable to verify if recent CBC obtained -  prior CBC during hospitalization in 06/2019 showing mild anemia which may be contributing to easy bruising concerns. Prior CMP 06/2020 unremarkable    Follow up in 6 months or call earlier if needed   CC:  [facility] provider: Dr. Milus Banister, Herbie Baltimore, MD    I spent 30 minutes of face-to-face and non-face-to-face time with patient.  This included previsit chart review, lab review, study review, order entry, electronic health record documentation, patient education regarding hx of ICH and secondary stroke prevention measures, importance of managing stroke risk factors, post ICH seizure and use of AED, surveillance monitoring, bruising concerns and answered all other questions to patient satisfaction   Frann Rider, AGNP-BC  [facility] [address] [place], [place] [address]  Phone [phone] Fax [phone] Note: This document was prepared with digital dictation and possible smart phrase technology. Any transcriptional errors that result from this process are unintentional.

## 2021-02-03 LAB — CBC WITH DIFFERENTIAL/PLATELET
Basophils Absolute: 0.1 10*3/uL (ref 0.0–0.2)
Basos: 1 %
EOS (ABSOLUTE): 0.1 10*3/uL (ref 0.0–0.4)
Eos: 2 %
Hematocrit: 42.7 % (ref 37.5–51.0)
Hemoglobin: 14.6 g/dL (ref 13.0–17.7)
Immature Grans (Abs): 0 10*3/uL (ref 0.0–0.1)
Immature Granulocytes: 0 %
Lymphocytes Absolute: 1.7 10*3/uL (ref 0.7–3.1)
Lymphs: 22 %
MCH: 30 pg (ref 26.6–33.0)
MCHC: 34.2 g/dL (ref 31.5–35.7)
MCV: 88 fL (ref 79–97)
Monocytes Absolute: 0.6 10*3/uL (ref 0.1–0.9)
Monocytes: 7 %
Neutrophils Absolute: 5.1 10*3/uL (ref 1.4–7.0)
Neutrophils: 68 %
Platelets: 240 10*3/uL (ref 150–450)
RBC: 4.86 x10E6/uL (ref 4.14–5.80)
RDW: 12.5 % (ref 11.6–15.4)
WBC: 7.5 10*3/uL (ref 3.4–10.8)

## 2021-04-23 DIAGNOSIS — Z20822 Contact with and (suspected) exposure to covid-19: Secondary | ICD-10-CM | POA: Diagnosis not present

## 2021-07-15 DIAGNOSIS — R569 Unspecified convulsions: Secondary | ICD-10-CM | POA: Diagnosis not present

## 2021-07-15 DIAGNOSIS — I10 Essential (primary) hypertension: Secondary | ICD-10-CM | POA: Diagnosis not present

## 2021-07-15 DIAGNOSIS — E7800 Pure hypercholesterolemia, unspecified: Secondary | ICD-10-CM | POA: Diagnosis not present

## 2021-07-15 DIAGNOSIS — Z125 Encounter for screening for malignant neoplasm of prostate: Secondary | ICD-10-CM | POA: Diagnosis not present

## 2021-07-15 DIAGNOSIS — Z0000 Encounter for general adult medical examination without abnormal findings: Secondary | ICD-10-CM | POA: Diagnosis not present

## 2021-07-15 DIAGNOSIS — Z8679 Personal history of other diseases of the circulatory system: Secondary | ICD-10-CM | POA: Diagnosis not present

## 2021-07-15 DIAGNOSIS — D692 Other nonthrombocytopenic purpura: Secondary | ICD-10-CM | POA: Diagnosis not present

## 2021-07-15 DIAGNOSIS — Z1389 Encounter for screening for other disorder: Secondary | ICD-10-CM | POA: Diagnosis not present

## 2021-08-10 ENCOUNTER — Ambulatory Visit (INDEPENDENT_AMBULATORY_CARE_PROVIDER_SITE_OTHER): Payer: Medicare Other | Admitting: Adult Health

## 2021-08-10 ENCOUNTER — Encounter: Payer: Self-pay | Admitting: Adult Health

## 2021-08-10 VITALS — BP 129/74 | HR 54 | Ht 69.0 in | Wt 169.0 lb

## 2021-08-10 DIAGNOSIS — I639 Cerebral infarction, unspecified: Secondary | ICD-10-CM

## 2021-08-10 DIAGNOSIS — I609 Nontraumatic subarachnoid hemorrhage, unspecified: Secondary | ICD-10-CM

## 2021-08-10 DIAGNOSIS — R569 Unspecified convulsions: Secondary | ICD-10-CM

## 2021-08-10 MED ORDER — LEVETIRACETAM 250 MG PO TABS: 250.0000 mg | ORAL_TABLET | Freq: Two times a day (BID) | ORAL | 5 refills | Status: DC

## 2021-08-10 NOTE — Patient Instructions (Addendum)
Continue Keppra but will attempt to wean dosage - decrease dose down to 250 mg twice daily for 3 months then can proceed to 250 mg nightly for 3 months then can stop. We will plan on repeat EEG after completing Keppra - you will be called to schedule  Continue to follow up with PCP regarding blood pressure management  Maintain strict control of hypertension with blood pressure goal below 130/90      Followup in the future with me in 8 months or call earlier if needed       Thank you for coming to see [place] at [facility]. I hope we have been able to provide you high quality care today.  You may receive a patient satisfaction survey over the next few weeks. We would appreciate your feedback and comments so that we may continue to improve ourselves and the health of our patients.

## 2021-08-10 NOTE — Progress Notes (Signed)
[facility] [address] [place]. [place] [address] [phone]       STROKE FOLLOW UP NOTE  [name] Date of Birth:  Oct 24, 1951 Medical Record Number:  [id number]   Reason for Referral: stroke follow up    CHIEF COMPLAINT:  Chief Complaint  Patient presents with   Follow-up    RM 3 alone Pt is well and stable, no new concerns     HPI:  Update 08/10/2021 JM: Returns for 34-month stroke and seizure follow-up.  Unaccompanied.  Overall stable.  Denies new or reoccurring stroke/TIA symptoms.  Compliant on Keppra tolerating and no seizure activity. He does report recent lab work by PCP which showed keppra level on lower side of normal and was told dosage may need to be increased. Continues to maintain ADLs and IADLs independently.  Blood pressure today 129/74.  No new concerns at this time.     History provided for reference purposes only Update 02/02/2021 JM: Thomas Norman returns for 62-month stroke and seizure follow-up  Stable from stroke standpoint without new or recurring stroke/TIA symptoms Blood pressure today 135/77 on amlodipine 2.5 mg daily and metoprolol 25 mg twice daily. Monitors at home and typically 110-120s/70s-80s.  Remains on Keppra 500 mg twice daily tolerating without side effects without any recent seizure activity Remains active maintaining all ADLs and IADLs independently He does report chronic history of easy bruising more so upper arms   No further concerns at this time  Update 07/22/2020 JM: Thomas Norman is being seen for stroke follow-up.  He has been doing well since prior visit without new or reoccurring stroke/TIA symptoms.  He remains on Keppra 500 mg twice daily tolerating well without seizure activity.  Blood pressure today 131/77.  He does monitor at home and typically 110-120/80s.  Reports recent physical with PCP with lab work all satisfactory.  No concerns at this time.  Update 02/10/2020 JM: Thomas Norman returns for  follow-up regarding left parietal ICH in 06/2019.  He has been doing well since prior visit without residual stroke deficits or new/reoccurring stroke/TIA symptoms.  He does note occasional short-term memory issues prior to his stroke and has noticed improvements of short-term memory after his stroke.  He also endorses prior episodes of transient confusion lasting approximately 30 minutes and then subsiding.  He reports 3 prior episodes with last episode occurring approximately 3 years prior to his stroke.  He had no work-up or evaluation for those episodes previously.  Denies any recurrent episodes or concerns.  Repeat MRI w/wo contrast 11/12/2019 again did not show any abnormalities or significant change from prior MRI.  Continues on Keppra 500 mg twice daily tolerating well without recurrent seizure activity.  Blood pressure today 122/70.  Reports recent lab work by PCP which did show slightly elevated lipid panel and has restarted on omega-3 and plans on repeating lipid panel PCP in the fall.  No concerns at this time.  Update 10/09/2019: Thomas Norman is a 69 year old male who is being seen today for stroke follow-up.  He has been doing well from a stroke standpoint without residual deficits or reoccurring/new stroke/TIA symptoms.  MRI w/wo contrast was done on 09/07/2019 which did not show any underlying abnormalities for possible cause/etiology of recent hemorrhage.  Also repeated EEG which was normal.  He was evaluated by [facility] neurology for second opinion regarding left parietal ICH etiology.  It was recommended by Dr. Patsey Berthold to undergo catheter angiogram for further evaluation.  Angiogram unremarkable for evidence  of AVMs or abnormalities.  He continues to be stable from a neurological standpoint without residual deficits.  He continues on Keppra tolerating well without reoccurring seizure activity.  Continues to monitor blood pressure at home and typically range 100-1 29/70 to 80s.  Patient questions  etiology of hemorrhage which was primarily discussed during visit.  He does endorse after his fall, he immediately returned to trying to pull his trailer using increased exertion and continued increased exertion up until he presented to ED.  He also endorses possible worsening cognition/memory as he uses a calendar to write dates are important information down typically 1 day/week but he noticed after his fall, he was writing something on his calendar daily.  He also reports upon his symptom onset of increased confusion and aphasia, he took 2x 325 mg tablets of aspirin as he believes he was having a stroke.  Stroke admission 07/13/2019: Thomas Norman is a 70 y.o. male with history of HTN and HLD  presented on 07/13/2019 with aphasia and confusion.  Witnessed tonic-clonic seizure activity in ED lobby.    Stroke work-up revealed small acute left parietal ICH as evidenced on CT head and MRI likely related to hypertension.  He did not receive IV t-PA due to [place].  MRI did not show evidence of underlying mass lesion, AVM or CAA but did show mild scattered subarachnoid hemorrhage seen within the adjacent left frontal-occipital region.  CTA head/neck unremarkable.  LDL 95.  A1c 5.2.  Witnessed tonic-clonic seizure while in ER with EEG showing evidence of epileptogenicity in left frontal area along with severe diffuse encephalopathy likely secondary to sedated state during EEG.  Elevated blood pressure post seizure activity but otherwise stable during admission.  Loaded with Keppra and recommended continuation at discharge for seizure prophylaxis.  HTN and HLD stable.  Other stroke risk factors include advanced age and EtOH use but no prior history of stroke.  Discharged home in stable condition recommendation of outpatient PT/OT.  Initial visit 08/19/2019 JM: Thomas Norman is a 70 year old male who is being seen today for stroke follow-up accompanied by his wife.  He has been doing well since discharge with complete  resolution of residual deficits.  He was initially participating in outpatient PT/ST and has since been discharged.  He has not returned back to prior activities as he was not sure of any restrictions and fear of reoccurring stroke or worsening of bleed.  He does endorse roughly 10 days prior to symptom onset, he was attempting to move a loaded trailer but his hand slipped off the jack and fell backwards "with considerable force" hitting the back portion of his head.  He denies loss of consciousness but reports "worst head "I ever remember having".  He did experience pain for approximately 30 minutes but then resolved and did not experience any headache, vision loss, weakness, AMS or speech difficulty until he presented to the ED.  Blood pressure has always been well managed typically ranging 110-120/70s.  Blood pressure today 116/70.  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  He has remained on Keppra 500 mg twice daily without side effects or reoccurring seizure activity.  He does have a history of easy bruising noticeably greater distal extremities upper>lower.  He ensures avoidance of aspirin, aspirin-containing products or ibuprofen products. no further concerns at this time.        ROS:   14 system review of systems performed  and negative with exception of see HPI  PMH:  Past Medical History:  Diagnosis Date   Hypertension    Kidney calculi     PSH:  Past Surgical History:  Procedure Laterality Date   LITHOTRIPSY     skin cancers     TONSILLECTOMY      Social History:  Social History   Socioeconomic History   Marital status: Married    Spouse name: Not on file   Number of children: Not on file   Years of education: Not on file   Highest education level: Not on file  Occupational History   Not on file  Tobacco Use   Smoking status: Never   Smokeless tobacco: Never  Substance and Sexual  Activity   Alcohol use: Yes    Alcohol/week: 1.0 standard drink    Types: 1 Glasses of wine per week   Drug use: No   Sexual activity: Yes  Other Topics Concern   Not on file  Social History Narrative   Not on file   Social Determinants of Health   Financial Resource Strain: Not on file  Food Insecurity: Not on file  Transportation Needs: Not on file  Physical Activity: Not on file  Stress: Not on file  Social Connections: Not on file  Intimate Partner Violence: Not on file    Family History:  Family History  Problem Relation Age of Onset   Diabetes Mother    Dementia Mother     Medications:   Current Outpatient Medications on File Prior to Visit  Medication Sig Dispense Refill   amLODipine (NORVASC) 2.5 MG tablet Take 1 tablet (2.5 mg total) by mouth daily. 30 tablet 11   Coenzyme Q10 (CO Q 10 PO) Take 1 tablet by mouth daily.     levETIRAcetam (KEPPRA) 500 MG tablet Take 1 tablet (500 mg total) by mouth 2 (two) times daily. 180 tablet 3   metoprolol tartrate (LOPRESSOR) 25 MG tablet Take 1 tablet (25 mg total) by mouth 2 (two) times daily. 60 tablet 11   Multiple Vitamins-Minerals (VITAMIN D3 COMPLETE PO) Take by mouth.     Omega-3 Fatty Acids (FISH OIL) 1000 MG CAPS Take by mouth 3 (three) times a week.     No current facility-administered medications on file prior to visit.    Allergies:   Allergies  Allergen Reactions   Sulfonamide Derivatives Rash     Physical Exam  Vitals:   08/10/21 1238  BP: 129/74  Pulse: (!) 54  Weight: 169 lb (76.7 kg)  Height: 5\' 9"  (1.753 m)   Body mass index is 24.96 kg/m. No results found.  General: well developed, well nourished, pleasant middle-age Caucasian male, seated, in no evident distress Head: head normocephalic and atraumatic.   Neck: supple with no carotid or supraclavicular bruits Cardiovascular: regular rate and rhythm, no murmurs Musculoskeletal: no deformity Skin:  no rash/petichiae Vascular:  Normal  pulses all extremities   Neurologic Exam Mental Status: Awake and fully alert.   Fluent speech and language.  Oriented to place and time. Recent and remote memory intact. Attention span, concentration and fund of knowledge appropriate. Mood and affect appropriate.  Cranial Nerves: Pupils equal, briskly reactive to light. Extraocular movements full without nystagmus. Visual fields full to confrontation. Hearing intact. Facial sensation intact. Face, tongue, palate moves normally and symmetrically.  Motor: Normal bulk and tone. Normal strength in all tested extremity muscles. Sensory.: intact to touch , pinprick , position and vibratory sensation.  Coordination:  Rapid alternating movements normal in all extremities. Finger-to-nose and heel-to-shin performed accurately bilaterally. Gait and Station: Arises from chair without difficulty. Stance is normal. Gait demonstrates normal stride length and balance Reflexes: 1+ and symmetric. Toes downgoing.         ASSESSMENT: Thomas Norman is a 70 y.o. year old male presented with aphasia, confusion, agitation and elevated BP (post ictal)  on 07/13/2019 with stroke work-up revealing small acute left parietal ICH and mild scattered subarachnoid hemorrhage left parietal-occipital region with subsequent tonic-clonic seizure. Vascular risk factors include HTN and HLD.  MRI W/WO contrast x2 negative for underlying abnormalities.  He was evaluated by [facility] neurology for second opinion regarding hemorrhage etiology which remained indeterminate.      PLAN:  Left parietal ICH/parietal-occipital SAH:  Recovered well without residual deficits No indication for antithrombotic therefore advised to avoid aspirin, aspirin-containing products and ibuprofen products.  Continue fish oil for secondary stroke prevention.  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 Start with 250 mg twice daily for 3 months then 250 mg nightly for 3 months then discontinue Will plan on repeat EEG once Keppra completely stopped Did discuss possibility of seizures with decreasing dosage and to call with any concerns Labs routinely monitored by PCP - no indication for routine Keppra level monitoring    Follow up in 8 months or call earlier if needed   CC:  Gaynelle Arabian, MD    I spent 32 minutes of face-to-face and non-face-to-face time with patient.  This included previsit chart review, lab review, study review, order entry, electronic health record documentation, patient education regarding hx of ICH and secondary stroke prevention measures, importance of managing stroke risk factors, post ICH seizure and use of AED and possibly weaning, and answered all other questions to patient satisfaction  Frann Rider, AGNP-BC  [facility] [address] [place], [place] [address]  Phone [phone] Fax [phone] Note: This document was prepared with digital dictation and possible smart phrase technology. Any transcriptional errors that result from this process are unintentional.

## 2021-09-08 DIAGNOSIS — Z20822 Contact with and (suspected) exposure to covid-19: Secondary | ICD-10-CM | POA: Diagnosis not present

## 2021-10-07 DIAGNOSIS — Z20828 Contact with and (suspected) exposure to other viral communicable diseases: Secondary | ICD-10-CM | POA: Diagnosis not present

## 2021-12-16 DIAGNOSIS — D225 Melanocytic nevi of trunk: Secondary | ICD-10-CM | POA: Diagnosis not present

## 2021-12-16 DIAGNOSIS — Z85828 Personal history of other malignant neoplasm of skin: Secondary | ICD-10-CM | POA: Diagnosis not present

## 2021-12-16 DIAGNOSIS — L821 Other seborrheic keratosis: Secondary | ICD-10-CM | POA: Diagnosis not present

## 2021-12-16 DIAGNOSIS — L570 Actinic keratosis: Secondary | ICD-10-CM | POA: Diagnosis not present

## 2021-12-16 DIAGNOSIS — Z85820 Personal history of malignant melanoma of skin: Secondary | ICD-10-CM | POA: Diagnosis not present

## 2021-12-16 DIAGNOSIS — D1801 Hemangioma of skin and subcutaneous tissue: Secondary | ICD-10-CM | POA: Diagnosis not present

## 2022-02-02 DIAGNOSIS — Z961 Presence of intraocular lens: Secondary | ICD-10-CM | POA: Diagnosis not present

## 2022-02-02 DIAGNOSIS — H35373 Puckering of macula, bilateral: Secondary | ICD-10-CM | POA: Diagnosis not present

## 2022-02-10 DIAGNOSIS — Z20822 Contact with and (suspected) exposure to covid-19: Secondary | ICD-10-CM | POA: Diagnosis not present

## 2022-02-25 DIAGNOSIS — Z20822 Contact with and (suspected) exposure to covid-19: Secondary | ICD-10-CM | POA: Diagnosis not present

## 2022-04-12 ENCOUNTER — Ambulatory Visit (INDEPENDENT_AMBULATORY_CARE_PROVIDER_SITE_OTHER): Payer: Medicare Other | Admitting: Adult Health

## 2022-04-12 ENCOUNTER — Encounter: Payer: Self-pay | Admitting: Adult Health

## 2022-04-12 VITALS — BP 118/68 | HR 68 | Ht 71.0 in | Wt 167.0 lb

## 2022-04-12 DIAGNOSIS — I639 Cerebral infarction, unspecified: Secondary | ICD-10-CM | POA: Diagnosis not present

## 2022-04-12 DIAGNOSIS — I609 Nontraumatic subarachnoid hemorrhage, unspecified: Secondary | ICD-10-CM | POA: Diagnosis not present

## 2022-04-12 DIAGNOSIS — R569 Unspecified convulsions: Secondary | ICD-10-CM

## 2022-04-12 NOTE — Patient Instructions (Addendum)
Your Plan:  No changes today   You will be scheduled repeat EEG to ensure you are not having an silent seizures or at an increased risk of seizures   Continue to follow with PCP for aggressive stroke risk factor management      Follow up in 1 year or call earlier if needed      Thank you for coming to see [place] at [facility]. I hope we have been able to provide you high quality care today.  You may receive a patient satisfaction survey over the next few weeks. We would appreciate your feedback and comments so that we may continue to improve ourselves and the health of our patients.

## 2022-04-12 NOTE — Progress Notes (Signed)
[facility] [address] [place]. [place] [address] [phone]       STROKE FOLLOW UP NOTE  Thomas Norman Date of Birth:  1951-09-12 Medical Record Number:  [id number]   Reason for Referral: stroke follow up    CHIEF COMPLAINT:  Chief Complaint  Patient presents with   Follow-up    Rm 3 alone Pt is well and stable, no new sz or stroke concerns  Stopped keppra in march     HPI:  Update 04/12/2022 JM: Patient returns for stroke and seizure follow-up after prior visit 8 months ago.  He is unaccompanied.  Overall doing well.  Denies new or reoccurring stroke/TIA symptoms.  Denies any seizure activity and has since stopped his keppra back in March. He does feel better overall since stopping.  Continues to maintain ADLs and IADLs independently.  Blood pressure today 118/68. Monitors at home - typically 110-120s/70-80s.  No new concerns at this time.       History provided for reference purposes only Update 08/10/2021 JM: Returns for 53-monthstroke and seizure follow-up.  Unaccompanied.  Overall stable.  Denies new or reoccurring stroke/TIA symptoms.  Compliant on Keppra tolerating and no seizure activity. He does report recent lab work by PCP which showed keppra level on lower side of normal and was told dosage may need to be increased. Continues to maintain ADLs and IADLs independently.  Blood pressure today 129/74.  No new concerns at this time.  Update 02/02/2021 JM: Mr. BBurdellreturns for 671-monthtroke and seizure follow-up  Stable from stroke standpoint without new or recurring stroke/TIA symptoms Blood pressure today 135/77 on amlodipine 2.5 mg daily and metoprolol 25 mg twice daily. Monitors at home and typically 110-120s/70s-80s.  Remains on Keppra 500 mg twice daily tolerating without side effects without any recent seizure activity Remains active maintaining all ADLs and IADLs independently He does report chronic history of easy bruising more so  upper arms   No further concerns at this time  Update 07/22/2020 JM: Thomas Norman for stroke follow-up.  He has been doing well since prior visit without new or reoccurring stroke/TIA symptoms.  He remains on Keppra 500 mg twice daily tolerating well without seizure activity.  Blood pressure today 131/77.  He does monitor at home and typically 110-120/80s.  Reports recent physical with PCP with lab work all satisfactory.  No concerns at this time.  Update 02/10/2020 JM: Thomas Norman for follow-up regarding left parietal ICH in 06/2019.  He has been doing well since prior visit without residual stroke deficits or new/reoccurring stroke/TIA symptoms.  He does note occasional short-term memory issues prior to his stroke and has noticed improvements of short-term memory after his stroke.  He also endorses prior episodes of transient confusion lasting approximately 30 minutes and then subsiding.  He reports 3 prior episodes with last episode occurring approximately 3 years prior to his stroke.  He had no work-up or evaluation for those episodes previously.  Denies any recurrent episodes or concerns.  Repeat MRI w/wo contrast 11/12/2019 again did not show any abnormalities or significant change from prior MRI.  Continues on Keppra 500 mg twice daily tolerating well without recurrent seizure activity.  Blood pressure today 122/70.  Reports recent lab work by PCP which did show slightly elevated lipid panel and has restarted on omega-3 and plans on repeating lipid panel PCP in the fall.  No concerns at this time.  Update 10/09/2019: Thomas Norman a  71 year old male who is being Norman today for stroke follow-up.  He has been doing well from a stroke standpoint without residual deficits or reoccurring/new stroke/TIA symptoms.  MRI w/wo contrast was done on 09/07/2019 which did not show any underlying abnormalities for possible cause/etiology of recent hemorrhage.  Also repeated EEG which was normal.   He was evaluated by [facility] neurology for second opinion regarding left parietal ICH etiology.  It was recommended by Dr. Patsey Berthold to undergo catheter angiogram for further evaluation.  Angiogram unremarkable for evidence of AVMs or abnormalities.  He continues to be stable from a neurological standpoint without residual deficits.  He continues on Keppra tolerating well without reoccurring seizure activity.  Continues to monitor blood pressure at home and typically range 100-1 29/70 to 80s.  Patient questions etiology of hemorrhage which was primarily discussed during visit.  He does endorse after his fall, he immediately returned to trying to pull his trailer using increased exertion and continued increased exertion up until he presented to ED.  He also endorses possible worsening cognition/memory as he uses a calendar to write dates are important information down typically 1 day/week but he noticed after his fall, he was writing something on his calendar daily.  He also reports upon his symptom onset of increased confusion and aphasia, he took 2x 325 mg tablets of aspirin as he believes he was having a stroke.  Stroke admission 07/13/2019: Thomas Norman is a 71 y.o. male with history of HTN and HLD  presented on 07/13/2019 with aphasia and confusion.  Witnessed tonic-clonic seizure activity in ED lobby.    Stroke work-up revealed small acute left parietal ICH as evidenced on CT head and MRI likely related to hypertension.  He did not receive IV t-PA due to [place].  MRI did not show evidence of underlying mass lesion, AVM or CAA but did show mild scattered subarachnoid hemorrhage Norman within the adjacent left frontal-occipital region.  CTA head/neck unremarkable.  LDL 95.  A1c 5.2.  Witnessed tonic-clonic seizure while in ER with EEG showing evidence of epileptogenicity in left frontal area along with severe diffuse encephalopathy likely secondary to sedated state during EEG.  Elevated blood pressure post seizure  activity but otherwise stable during admission.  Loaded with Keppra and recommended continuation at discharge for seizure prophylaxis.  HTN and HLD stable.  Other stroke risk factors include advanced age and EtOH use but no prior history of stroke.  Discharged home in stable condition recommendation of outpatient PT/OT.  Initial visit 08/19/2019 JM: Thomas Norman is a 71 year old male who is being Norman today for stroke follow-up accompanied by his wife.  He has been doing well since discharge with complete resolution of residual deficits.  He was initially participating in outpatient PT/ST and has since been discharged.  He has not returned back to prior activities as he was not sure of any restrictions and fear of reoccurring stroke or worsening of bleed.  He does endorse roughly 10 days prior to symptom onset, he was attempting to move a loaded trailer but his hand slipped off the jack and fell backwards "with considerable force" hitting the back portion of his head.  He denies loss of consciousness but reports "worst head "I ever remember having".  He did experience pain for approximately 30 minutes but then resolved and did not experience any headache, vision loss, weakness, AMS or speech difficulty until he presented to the ED.  Blood pressure has always been well managed typically ranging 110-120/70s.  Blood pressure today 116/70.  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  He has remained on Keppra 500 mg twice daily without side effects or reoccurring seizure activity.  He does have a history of easy bruising noticeably greater distal extremities upper>lower.  He ensures avoidance of aspirin, aspirin-containing products or ibuprofen products. no further concerns at this time.        ROS:   14 system review of systems performed and negative with exception of see HPI  PMH:  Past Medical History:  Diagnosis Date    Hypertension    Kidney calculi     PSH:  Past Surgical History:  Procedure Laterality Date   LITHOTRIPSY     skin cancers     TONSILLECTOMY      Social History:  Social History   Socioeconomic History   Marital status: Married    Spouse name: Not on file   Number of children: Not on file   Years of education: Not on file   Highest education level: Not on file  Occupational History   Not on file  Tobacco Use   Smoking status: Never   Smokeless tobacco: Never  Substance and Sexual Activity   Alcohol use: Yes    Alcohol/week: 1.0 standard drink of alcohol    Types: 1 Glasses of wine per week   Drug use: No   Sexual activity: Yes  Other Topics Concern   Not on file  Social History Narrative   Not on file   Social Determinants of Health   Financial Resource Strain: Not on file  Food Insecurity: Not on file  Transportation Needs: Not on file  Physical Activity: Not on file  Stress: Not on file  Social Connections: Not on file  Intimate Partner Violence: Not on file    Family History:  Family History  Problem Relation Age of Onset   Diabetes Mother    Dementia Mother     Medications:   Current Outpatient Medications on File Prior to Visit  Medication Sig Dispense Refill   amLODipine (NORVASC) 2.5 MG tablet Take 1 tablet (2.5 mg total) by mouth daily. 30 tablet 11   cholecalciferol (VITAMIN D3) 25 MCG (1000 UNIT) tablet Take 1,000 Units by mouth daily.     Coenzyme Q10 (CO Q 10 PO) Take 1 tablet by mouth daily.     metoprolol tartrate (LOPRESSOR) 25 MG tablet Take 1 tablet (25 mg total) by mouth 2 (two) times daily. 60 tablet 11   Multiple Vitamins-Minerals (VITAMIN D3 COMPLETE PO) Take by mouth.     Omega-3 Fatty Acids (FISH OIL) 1000 MG CAPS Take by mouth 3 (three) times a week.     No current facility-administered medications on file prior to visit.    Allergies:   Allergies  Allergen Reactions   Sulfonamide Derivatives Rash     Physical  Exam  Vitals:   04/12/22 1233  BP: 118/68  Pulse: 68  Weight: 167 lb (75.8 kg)  Height: '5\' 11"'$  (1.803 m)   Body mass index is 23.29 kg/m. No results found.   General: well developed, well nourished, very pleasant middle-age Caucasian male, seated, in no evident distress Head: head normocephalic and atraumatic.   Neck: supple with no carotid or supraclavicular bruits Cardiovascular: regular rate and rhythm, no murmurs Musculoskeletal: no deformity Skin:  no rash/petichiae Vascular:  Normal pulses all extremities   Neurologic Exam Mental Status: Awake and fully  alert.   Fluent speech and language.  Oriented to place and time. Recent and remote memory intact. Attention span, concentration and fund of knowledge appropriate. Mood and affect appropriate.  Cranial Nerves: Pupils equal, briskly reactive to light. Extraocular movements full without nystagmus. Visual fields full to confrontation. Hearing intact. Facial sensation intact. Face, tongue, palate moves normally and symmetrically.  Motor: Normal bulk and tone. Normal strength in all tested extremity muscles. Sensory.: intact to touch , pinprick , position and vibratory sensation.  Coordination: Rapid alternating movements normal in all extremities. Finger-to-nose and heel-to-shin performed accurately bilaterally. Gait and Station: Arises from chair without difficulty. Stance is normal. Gait demonstrates normal stride length and balance without use of assistive device Reflexes: 1+ and symmetric. Toes downgoing.         ASSESSMENT: Thomas Norman is a 71 y.o. year old male presented with aphasia, confusion, agitation and elevated BP (post ictal)  on 07/13/2019 with stroke work-up revealing small acute left parietal ICH and mild scattered subarachnoid hemorrhage left parietal-occipital region with subsequent tonic-clonic seizure. Vascular risk factors include HTN and HLD.  MRI W/WO contrast x2 negative for underlying abnormalities.   He was evaluated by [facility] neurology for second opinion regarding hemorrhage etiology which remained indeterminate.      PLAN:  Left parietal ICH/parietal-occipital SAH:  Recovered well without residual deficits No indication for antithrombotic therefore advised to avoid aspirin, aspirin-containing products and ibuprofen products.  Continue fish oil for secondary stroke prevention.  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 Repeat EEG to ensure stability since stopping. Did discuss possibility of seizures with stopping AED and to call with any concerns    Follow up in 1 year per patient request or call earlier if needed   CC:  Gaynelle Arabian, MD    I spent 26 minutes of face-to-face and non-face-to-face time with patient.  This included previsit chart review, lab review, study review, order entry, electronic health record documentation, patient education regarding hx of ICH and secondary stroke prevention measures, importance of managing stroke risk factors, post ICH seizure and discontinuing AED and indication for EEG, and answered all other questions to patient satisfaction  Frann Rider, AGNP-BC  [facility] [address] [place], [place] [address]  Phone [phone] Fax [phone] Note: This document was prepared with digital dictation and possible smart phrase technology. Any transcriptional errors that result from this process are unintentional.

## 2022-05-10 ENCOUNTER — Ambulatory Visit: Payer: Medicare Other | Admitting: Neurology

## 2022-05-10 DIAGNOSIS — I639 Cerebral infarction, unspecified: Secondary | ICD-10-CM

## 2022-05-10 DIAGNOSIS — R569 Unspecified convulsions: Secondary | ICD-10-CM | POA: Diagnosis not present

## 2022-05-10 DIAGNOSIS — I609 Nontraumatic subarachnoid hemorrhage, unspecified: Secondary | ICD-10-CM

## 2022-05-16 ENCOUNTER — Encounter: Payer: Self-pay | Admitting: Adult Health

## 2022-06-30 ENCOUNTER — Observation Stay (HOSPITAL_COMMUNITY): Payer: Medicare Other

## 2022-06-30 ENCOUNTER — Inpatient Hospital Stay (HOSPITAL_COMMUNITY)
Admission: EM | Admit: 2022-06-30 | Discharge: 2022-07-02 | DRG: 100 | Disposition: A | Discharge status: Home / Self Care | Payer: Medicare Other | Attending: Internal Medicine | Admitting: Internal Medicine

## 2022-06-30 ENCOUNTER — Emergency Department (HOSPITAL_COMMUNITY): Payer: Medicare Other

## 2022-06-30 ENCOUNTER — Encounter (HOSPITAL_COMMUNITY): Payer: Self-pay | Admitting: Internal Medicine

## 2022-06-30 DIAGNOSIS — T4275XA Adverse effect of unspecified antiepileptic and sedative-hypnotic drugs, initial encounter: Secondary | ICD-10-CM | POA: Diagnosis present

## 2022-06-30 DIAGNOSIS — D72829 Elevated white blood cell count, unspecified: Secondary | ICD-10-CM | POA: Diagnosis present

## 2022-06-30 DIAGNOSIS — G40409 Other generalized epilepsy and epileptic syndromes, not intractable, without status epilepticus: Principal | ICD-10-CM | POA: Diagnosis present

## 2022-06-30 DIAGNOSIS — I10 Essential (primary) hypertension: Secondary | ICD-10-CM | POA: Diagnosis not present

## 2022-06-30 DIAGNOSIS — Z20822 Contact with and (suspected) exposure to covid-19: Secondary | ICD-10-CM | POA: Diagnosis present

## 2022-06-30 DIAGNOSIS — I69198 Other sequelae of nontraumatic intracerebral hemorrhage: Secondary | ICD-10-CM | POA: Diagnosis not present

## 2022-06-30 DIAGNOSIS — R4182 Altered mental status, unspecified: Secondary | ICD-10-CM | POA: Diagnosis not present

## 2022-06-30 DIAGNOSIS — R29810 Facial weakness: Secondary | ICD-10-CM | POA: Diagnosis present

## 2022-06-30 DIAGNOSIS — Z85828 Personal history of other malignant neoplasm of skin: Secondary | ICD-10-CM

## 2022-06-30 DIAGNOSIS — G9340 Encephalopathy, unspecified: Secondary | ICD-10-CM | POA: Diagnosis not present

## 2022-06-30 DIAGNOSIS — Z79899 Other long term (current) drug therapy: Secondary | ICD-10-CM

## 2022-06-30 DIAGNOSIS — R5381 Other malaise: Secondary | ICD-10-CM | POA: Diagnosis present

## 2022-06-30 DIAGNOSIS — Z8673 Personal history of transient ischemic attack (TIA), and cerebral infarction without residual deficits: Secondary | ICD-10-CM | POA: Diagnosis not present

## 2022-06-30 DIAGNOSIS — R4701 Aphasia: Secondary | ICD-10-CM | POA: Diagnosis present

## 2022-06-30 DIAGNOSIS — R531 Weakness: Secondary | ICD-10-CM | POA: Diagnosis not present

## 2022-06-30 DIAGNOSIS — T424X5A Adverse effect of benzodiazepines, initial encounter: Secondary | ICD-10-CM | POA: Diagnosis present

## 2022-06-30 DIAGNOSIS — J9811 Atelectasis: Secondary | ICD-10-CM | POA: Diagnosis present

## 2022-06-30 DIAGNOSIS — Z833 Family history of diabetes mellitus: Secondary | ICD-10-CM

## 2022-06-30 DIAGNOSIS — G9389 Other specified disorders of brain: Secondary | ICD-10-CM | POA: Diagnosis present

## 2022-06-30 DIAGNOSIS — G9341 Metabolic encephalopathy: Secondary | ICD-10-CM | POA: Diagnosis present

## 2022-06-30 DIAGNOSIS — R569 Unspecified convulsions: Secondary | ICD-10-CM | POA: Diagnosis not present

## 2022-06-30 HISTORY — DX: Unspecified convulsions: R56.9

## 2022-06-30 HISTORY — DX: Nontraumatic intracranial hemorrhage, unspecified: I62.9

## 2022-06-30 LAB — DIFFERENTIAL
Abs Immature Granulocytes: 0.05 10*3/uL (ref 0.00–0.07)
Basophils Absolute: 0.1 10*3/uL (ref 0.0–0.1)
Basophils Relative: 1 %
Eosinophils Absolute: 0.1 10*3/uL (ref 0.0–0.5)
Eosinophils Relative: 1 %
Immature Granulocytes: 1 %
Lymphocytes Relative: 12 %
Lymphs Abs: 1.2 10*3/uL (ref 0.7–4.0)
Monocytes Absolute: 0.5 10*3/uL (ref 0.1–1.0)
Monocytes Relative: 5 %
Neutro Abs: 8.6 10*3/uL — ABNORMAL HIGH (ref 1.7–7.7)
Neutrophils Relative %: 80 %

## 2022-06-30 LAB — COMPREHENSIVE METABOLIC PANEL
ALT: 17 U/L (ref 0–44)
AST: 26 U/L (ref 15–41)
Albumin: 3.6 g/dL (ref 3.5–5.0)
Alkaline Phosphatase: 74 U/L (ref 38–126)
Anion gap: 15 (ref 5–15)
BUN: 16 mg/dL (ref 8–23)
CO2: 16 mmol/L — ABNORMAL LOW (ref 22–32)
Calcium: 8.3 mg/dL — ABNORMAL LOW (ref 8.9–10.3)
Chloride: 108 mmol/L (ref 98–111)
Creatinine, Ser: 1.23 mg/dL (ref 0.61–1.24)
GFR, Estimated: >60 mL/min (ref >60)
Glucose, Bld: 126 mg/dL — ABNORMAL HIGH (ref 70–99)
Potassium: 3.7 mmol/L (ref 3.5–5.1)
Sodium: 139 mmol/L (ref 135–145)
Total Bilirubin: 0.8 mg/dL (ref 0.3–1.2)
Total Protein: 6.1 g/dL — ABNORMAL LOW (ref 6.5–8.1)

## 2022-06-30 LAB — CBC
HCT: 40.6 % (ref 39.0–52.0)
Hemoglobin: 13.4 g/dL (ref 13.0–17.0)
MCH: 30.2 pg (ref 26.0–34.0)
MCHC: 33 g/dL (ref 30.0–36.0)
MCV: 91.4 fL (ref 80.0–100.0)
Platelets: 200 10*3/uL (ref 150–400)
RBC: 4.44 MIL/uL (ref 4.22–5.81)
RDW: 11.9 % (ref 11.5–15.5)
WBC: 10.6 10*3/uL — ABNORMAL HIGH (ref 4.0–10.5)
nRBC: 0 % (ref 0.0–0.2)

## 2022-06-30 LAB — PROTIME-INR
INR: 1.2 (ref 0.8–1.2)
Prothrombin Time: 15 seconds (ref 11.4–15.2)

## 2022-06-30 LAB — I-STAT CHEM 8, ED
BUN: 16 mg/dL (ref 8–23)
Calcium, Ion: 1.15 mmol/L (ref 1.15–1.40)
Chloride: 105 mmol/L (ref 98–111)
Creatinine, Ser: 1.2 mg/dL (ref 0.61–1.24)
Glucose, Bld: 128 mg/dL — ABNORMAL HIGH (ref 70–99)
HCT: 42 % (ref 39.0–52.0)
Hemoglobin: 14.3 g/dL (ref 13.0–17.0)
Potassium: 4 mmol/L (ref 3.5–5.1)
Sodium: 139 mmol/L (ref 135–145)
TCO2: 19 mmol/L — ABNORMAL LOW (ref 22–32)

## 2022-06-30 LAB — GLUCOSE, CAPILLARY: Glucose-Capillary: 90 mg/dL (ref 70–99)

## 2022-06-30 LAB — ETHANOL: Alcohol, Ethyl (B): <10 mg/dL (ref <10)

## 2022-06-30 LAB — CBG MONITORING, ED: Glucose-Capillary: 137 mg/dL — ABNORMAL HIGH (ref 70–99)

## 2022-06-30 LAB — MAGNESIUM: Magnesium: 2.4 mg/dL (ref 1.7–2.4)

## 2022-06-30 LAB — APTT: aPTT: 28 seconds (ref 24–36)

## 2022-06-30 LAB — RESP PANEL BY RT-PCR (FLU A&B, COVID) ARPGX2
Influenza A by PCR: NEGATIVE
Influenza B by PCR: NEGATIVE
SARS Coronavirus 2 by RT PCR: NEGATIVE

## 2022-06-30 LAB — CK: Total CK: 202 U/L (ref 49–397)

## 2022-06-30 MED ORDER — LORAZEPAM 2 MG/ML IJ SOLN
INTRAMUSCULAR | Status: AC
  Administered 2022-06-30: 4 mg via INTRAVENOUS
  Filled 2022-06-30: qty 2

## 2022-06-30 MED ORDER — VALPROATE SODIUM 100 MG/ML IV SOLN
1000.0000 mg | Freq: Once | INTRAVENOUS | Status: AC
  Administered 2022-06-30: 1000 mg via INTRAVENOUS
  Filled 2022-06-30: qty 10

## 2022-06-30 MED ORDER — ACETAMINOPHEN 650 MG RE SUPP: 650.0000 mg | Freq: Four times a day (QID) | RECTAL | Status: DC | PRN

## 2022-06-30 MED ORDER — LORAZEPAM 2 MG/ML IJ SOLN
4.0000 mg | Freq: Once | INTRAMUSCULAR | Status: AC
Start: 2022-06-30 — End: 2022-06-30

## 2022-06-30 MED ORDER — LEVETIRACETAM IN NACL 1000 MG/100ML IV SOLN
1000.0000 mg | Freq: Once | INTRAVENOUS | Status: DC
Start: 2022-06-30 — End: 2022-06-30

## 2022-06-30 MED ORDER — LORAZEPAM 2 MG/ML IJ SOLN: 2.0000 mg | INTRAMUSCULAR | Status: DC | PRN

## 2022-06-30 MED ORDER — LORAZEPAM 2 MG/ML IJ SOLN
INTRAMUSCULAR | Status: AC
  Administered 2022-06-30: 2 mg
  Filled 2022-06-30: qty 1

## 2022-06-30 MED ORDER — LORAZEPAM 2 MG/ML IJ SOLN: 4.0000 mg | Freq: Once | INTRAMUSCULAR | Status: AC

## 2022-06-30 MED ORDER — LEVETIRACETAM IN NACL 500 MG/100ML IV SOLN
500.0000 mg | Freq: Two times a day (BID) | INTRAVENOUS | Status: DC
Start: 2022-07-01 — End: 2022-06-30

## 2022-06-30 MED ORDER — LEVETIRACETAM IN NACL 500 MG/100ML IV SOLN
500.0000 mg | Freq: Two times a day (BID) | INTRAVENOUS | Status: DC
  Administered 2022-06-30 – 2022-07-01 (×2): 500 mg via INTRAVENOUS
  Filled 2022-06-30 (×3): qty 100

## 2022-06-30 MED ORDER — ACETAMINOPHEN 325 MG PO TABS: 650.0000 mg | ORAL_TABLET | Freq: Four times a day (QID) | ORAL | Status: DC | PRN

## 2022-06-30 NOTE — ED Provider Notes (Addendum)
[facility] EMERGENCY DEPARTMENT Provider Note   CSN: [id number] Arrival date & time: 06/30/22  1613     History  Chief Complaint  Patient presents with   Code Stroke   Altered Mental Status    Thomas Norman is a 71 y.o. male.   Altered Mental Status Patient presents with mental status change.  Prior to arrival was having some difficulty speaking.  Potentially some mild weakness on right.  Did have previous intracranial hemorrhage around 3 years ago.  Had been on seizure medicines.  Stopped recently.  Code stroke had been called but then waiting room had a tonic-clonic seizure.  From a nonverbal and unable to provide any history.    Past Medical History:  Diagnosis Date   Hypertension    Kidney calculi     Home Medications Prior to Admission medications   Medication Sig Start Date End Date Taking? Authorizing Provider  amLODipine (NORVASC) 2.5 MG tablet Take 1 tablet (2.5 mg total) by mouth daily. 07/19/19 04/12/22  Nita Sells, MD  cholecalciferol (VITAMIN D3) 25 MCG (1000 UNIT) tablet Take 1,000 Units by mouth daily.    [provider]  Coenzyme Q10 (CO Q 10 PO) Take 1 tablet by mouth daily.    [provider]  metoprolol tartrate (LOPRESSOR) 25 MG tablet Take 1 tablet (25 mg total) by mouth 2 (two) times daily. 07/19/19 04/12/22  Nita Sells, MD  Multiple Vitamins-Minerals (VITAMIN D3 COMPLETE PO) Take by mouth.    [provider]  Omega-3 Fatty Acids (FISH OIL) 1000 MG CAPS Take by mouth 3 (three) times a week.    [provider]      Allergies    Sulfonamide derivatives    Review of Systems   Review of Systems  Physical Exam Updated Vital Signs BP 116/71   Pulse 74   Resp 19   Ht '5\' 11"'$  (1.803 m)   Wt 72.6 kg   SpO2 100%   BMI 22.32 kg/m  Physical Exam Vitals and nursing note reviewed.  Cardiovascular:     Rate and Rhythm: Regular rhythm.  Pulmonary:     Breath sounds: No wheezing or  rhonchi.  Abdominal:     Tenderness: There is no abdominal tenderness.  Musculoskeletal:        General: No tenderness.  Skin:    General: Skin is warm.  Neurological:     Comments: Patient initially somewhat combative.  Moving all extremities.  Somewhat difficult to get exam off this however.  Patient later developed eyes deviated to the right and likely seizure activity.     ED Results / Procedures / Treatments   Labs (all labs ordered are listed, but only abnormal results are displayed) Labs Reviewed  CBC - Abnormal; Notable for the following components:      Result Value   WBC 10.6 (*)    All other components within normal limits  DIFFERENTIAL - Abnormal; Notable for the following components:   Neutro Abs 8.6 (*)    All other components within normal limits  COMPREHENSIVE METABOLIC PANEL - Abnormal; Notable for the following components:   CO2 16 (*)    Glucose, Bld 126 (*)    Calcium 8.3 (*)    Total Protein 6.1 (*)    All other components within normal limits  I-STAT CHEM 8, ED - Abnormal; Notable for the following components:   Glucose, Bld 128 (*)    TCO2 19 (*)    All other components  within normal limits  CBG MONITORING, ED - Abnormal; Notable for the following components:   Glucose-Capillary 137 (*)    All other components within normal limits  RESP PANEL BY RT-PCR (FLU A&B, COVID) ARPGX2  ETHANOL  PROTIME-INR  APTT  RAPID URINE DRUG SCREEN, HOSP PERFORMED  URINALYSIS, ROUTINE W REFLEX MICROSCOPIC    EKG None  Radiology CT HEAD CODE STROKE WO CONTRAST  Result Date: 06/30/2022 CLINICAL DATA:  Code stroke.  Stroke, follow up EXAM: CT HEAD WITHOUT CONTRAST TECHNIQUE: Contiguous axial images were obtained from the base of the skull through the vertex without intravenous contrast. RADIATION DOSE REDUCTION: This exam was performed according to the departmental dose-optimization program which includes automated exposure control, adjustment of the mA and/or kV  according to patient size and/or use of iterative reconstruction technique. COMPARISON:  CT head 07/14/2019. FINDINGS: Brain: Encephalomalacia in the left parietal lobe, compatible with the sequela of prior hemorrhage. Additional patchy white matter hypoattenuation, nonspecific but compatible with chronic microvascular ischemic disease. No evidence of acute large vascular territory infarct, acute hemorrhage, mass lesion, midline shift, or hydrocephalus Vascular: No hyperdense vessel identified. Calcific intracranial atherosclerosis. Skull: No acute fracture. Sinuses/Orbits: No acute findings. Other: Partially imaged rotation of C1 on C2. ASPECTS ([place] Stroke Program Early CT Score) Total score (0-10 with 10 being normal): 10. IMPRESSION: 1. No evidence of acute intracranial abnormality.  ASPECTS is 10. 2. Encephalomalacia in the left parietal lobe, compatible with the sequela of prior hemorrhage. 3. Chronic microvascular ischemic disease. 4. Partially imaged rotation of C1 on C2, probably positional in the absence of a fixed torticollis. Code stroke imaging results were communicated on 06/30/2022 at 5:29 pm to provider Bhagat via secure text paging. Electronically Signed   By: Margaretha Sheffield M.D.   On: 06/30/2022 17:30    Procedures Procedures    Medications Ordered in ED Medications  LORazepam (ATIVAN) 2 MG/ML injection (2 mg  Given 06/30/22 1658)  LORazepam (ATIVAN) injection 4 mg (4 mg Intravenous Given 06/30/22 1709)  valproate (DEPACON) 1,000 mg in dextrose 5 % 50 mL IVPB (1,000 mg Intravenous New Bag/Given 06/30/22 1814)  LORazepam (ATIVAN) injection 4 mg (2 mg Intravenous Given 06/30/22 1651)    ED Course/ Medical Decision Making/ A&P                           Medical Decision Making Risk Prescription drug management.   Patient presented as a code stroke.  Had last normal around 3:00.  Confusion difficulty speaking.  Reportedly had some mild right-sided weakness and code stroke Been called.   Then had seizure activity.  Had some improvement but then recurrent seizure activity.  Had been given initially 2 of Ativan then another 2.  Then had recurrent seizure activity and given 4 more.  This made him sedated enough that he was able to get CT scan which was reassuring.  Not felt to be a TnkCandidate due to seizure activity and no definite focal neurodeficits.  Low on Depakote since per report patient had been off the [place] because he did not like the way it was making him feel.  Later discussing wife she states that he was taken off it because he did not need it anymore.  Keppra had been stopped in March.  CRITICAL CARE Performed by: Davonna Belling Total critical care time: 30 minutes Critical care time was exclusive of separately billable procedures and treating other patients. Critical care was necessary to treat  or prevent imminent or life-threatening deterioration. Critical care was time spent personally by me on the following activities: development of treatment plan with patient and/or surrogate as well as nursing, discussions with consultants, evaluation of patient's response to treatment, examination of patient, obtaining history from patient or surrogate, ordering and performing treatments and interventions, ordering and review of laboratory studies, ordering and review of radiographic studies, pulse oximetry and re-evaluation of patient's condition.         Final Clinical Impression(s) / ED Diagnoses Final diagnoses:  Seizures [facility])    Rx / DC Orders ED Discharge Orders     None         Davonna Belling, MD 06/30/22 Darlin Drop    Davonna Belling, MD 06/30/22 1919

## 2022-06-30 NOTE — H&P (Signed)
History and Physical    PLEASE NOTE THAT DRAGON DICTATION SOFTWARE WAS USED IN THE CONSTRUCTION OF THIS NOTE.   Thomas Norman LKG:[id number] DOB: November 28, 1950 DOA: 06/30/2022  PCP: Gaynelle Arabian, MD  Patient coming from: home   I have personally briefly reviewed patient's old medical records in [place]  Chief Complaint: Confusion  HPI: Thomas Norman is a 71 y.o. 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   In the setting of the patient's current altered mental status, the following history is provided via my discussions with the patient's wife as well as the patient's sister-in-law, both of whom are present at bedside, in addition to my discussions with the EDP and and via chart review.  Patient's wife conveys that the patient was in his normal state of health, without any acute complaints over the last several days, including today, and notes that the patient drove his wife to and from [place] earlier today without incident.  Then, at approximately 1500 today, the patient, while conversing with his wife, became acutely confused, and the wife noted that the patient was " using the incorrect words to describe" what was occurring, "like he was having trouble finding the correct words".  She did not note any associated slurring of speech nor any associated facial droop.  No recent preceding trauma. she subsequent brought the patient to [facility] emergency department for further evaluation and management.   While in triage she reportedly experienced a witnessed tonic-clonic seizure involving all 4 extremities.  While the duration of this seizure-like activities not entirely clear, it appears to have completely resolved following administration of Ativan 2 mg IV x1.  He was  subsequently somnolent, with diminished responsiveness.  He was subsequently becoming more responsive and alert, but was noted to be agitated.  At that time, he received Ativan 4 mg IV x1 in order to facilitate pursuit of CT head, noting that this 4 mg dose of IV Ativan was not administered for any overt residual seizure-like activity.  Following completion of CT head, the patient reportedly exhibited evidence of an additional witnessed episode of generalized tonic-clonic activity, which resolved following dose of Ativan 4 mg IV x1.  He also received Depakote 1000 mg IV x1, and has subsequently been somnolent with diminished responsiveness, but without any additional evidence of tonic-clonic activity.  Is unclear if the above episodes were associated with any loss of bowel/bladder function or any tongue biting.  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   He had subsequently been on Keppra 500 mg p.o. twice daily leading up to March 2023, at which time his Keppra was discontinued, as the patient reportedly had had no additional seizure activity over the preceding 2 years.  He is noted to follow with [facility] neurology, with most recent appointment having occurred on 04/12/2022.  He is set to follow-up with [facility] in June 2024.  At this time, he is not on any additional antiepileptic medications as an outpatient following aforementioned discontinuation of  his Keppra in March 2023.  Wife conveys that the patient consumes 1 beer 1 time per week on average, with a recent increase in volume or frequency of alcohol consumption.  No history of  recreational drug use, including abuse of stimulant class recreational drugs, including the use of cocaine, methamphetamine, or amphetamines.  Aside from the aforementioned discontinuation of Keppra in March 2023, no recent changes to home medication regimen.  Not on any chronic or recent benzodiazepines, opioids, or antidepressant medications, including no SSRIs or SNRIs.  No history of diabetes, and does not take insulin or sulfonylureas as an outpatient.  Not on a blood thinners as an outpatient, including no baby aspirin.  No report of any recent subjective fever, chills, rigors, or generalized myalgias.  No recent report of chest pain, shortness of breath, cough, abdominal pain, diarrhea, headache or neck stiffness.     Remainder of [facility] ED Course:  Vital signs in the ED were notable for the following: Afebrile; heart rate 70-87; blood pressure 107/74 - 120/83; respiratory rate 19-22; oxygen saturation 97 to 100% on room air.  Labs were notable for the following: CMP notable for the following: Sodium 139, bicarbonate 16, anion gap 15, creatinine 1.23, glucose 126, calcium, adjusted for hypoalbuminemia was noted to be 8.7, albumin 3.6; otherwise, liver enzymes within normal limits.  CBC notable for will with cell count 10,600.  INR 1.2.  Serum ethanol level less than 10.  Urinalysis and urine drug screen been ordered, with results currently pending.  COVID-19/influenza PCR currently pending.  Imaging and additional notable ED work-up: EKG shows sinus rhythm with heart rate 71, normal intervals, nonspecific T wave inversion in lead III, and no evidence of ST changes, including no evidence of ST elevation.  Noncontrast CT head showed no evidence of acute intracranial process, including no evidence of intracranial hemorrhage nor any evidence of acute infarct.  CT head also showed evidence of encephalomalacia in the left parietal lobe, compatible with sequela of prior hemorrhage, and also  demonstrated evidence of chronic microvascular ischemic disease.  EDP discussed patient's case with the on-call neurologist, Dr. Curly Shores, Who has formally consulted, recommending admission to the hospital service for further evaluation and management of presenting seizures, suspecting contribution from recent discontinuation of outpatient Keppra, but also recommending further evaluation with MRI brain, stat EEG.  She also recommends resumption of previously discontinued Keppra 500 mg twice daily, chest x-ray, CPK level.   While in the ED, the following were administered: Ativan 4 mg IV x2 doses, Ativan 2 mg IV x1, valproic acid 1 g IV x1.  Subsequently, the patient was admitted for further evaluation and management of presenting seizures.     Review of Systems: As per HPI otherwise 10 point review of systems negative.   Past Medical History:  Diagnosis Date   Hypertension    Intracranial hemorrhage ([place])    Kidney calculi    Seizures ([employer])     Past Surgical History:  Procedure Laterality Date   LITHOTRIPSY     skin cancers     TONSILLECTOMY      Social History:  reports that he has never smoked. He has never used smokeless tobacco. He reports current alcohol use of about 1.0 standard drink of alcohol per week. He reports that he does not use drugs.   Allergies  Allergen Reactions   Sulfonamide Derivatives Rash    Family History  Problem Relation Age of Onset   Diabetes Mother    Dementia Mother  Prior to Admission medications   Medication Sig Start Date End Date Taking? Authorizing Provider  amLODipine (NORVASC) 2.5 MG tablet Take 1 tablet (2.5 mg total) by mouth daily. 07/19/19 06/30/22 Yes Nita Sells, MD  Coenzyme Q10 (CO Q 10 PO) Take 1 tablet by mouth daily.   Yes [provider]  metoprolol tartrate (LOPRESSOR) 25 MG tablet Take 1 tablet (25 mg total) by mouth 2 (two) times daily. 07/19/19 06/30/22 Yes Nita Sells, MD  Multiple  Vitamins-Minerals (VITAMIN D3 COMPLETE PO) Take 1 tablet by mouth daily.   Yes [provider]  Omega-3 Fatty Acids (FISH OIL) 1000 MG CAPS Take by mouth 3 (three) times a week.   Yes [provider]     Objective    Physical Exam: Vitals:   06/30/22 1830 06/30/22 1845 06/30/22 1900 06/30/22 1915  BP: 116/68 120/83 116/71 115/73  Pulse: 72 67 70 74  Resp: (!) 21 (!) _0 SpO2: 100% 100% 100% 100%  Weight:      Height:        General: appears to be stated age; somnolent  Skin: warm, dry, no rash Head:  AT/[place] Mouth:  Oral mucosa membranes appear moist, normal dentition Neck: supple; trachea midline Heart:  RRR; did not appreciate any M/R/G Lungs: CTAB, did not appreciate any wheezes, rales, or rhonchi Abdomen: + BS; soft, ND Vascular: 2+ pedal pulses b/l; 2+ radial pulses b/l Extremities: no peripheral edema, no muscle wasting Neuro:  In the setting of the patient's current mental status and associated inability to follow instructions, unable to perform full neurologic exam at this time.  As such, assessment of strength, sensation, and cranial nerves is limited at this time. Patient noted to spontaneously move all 4 extremities. No tremors.     Labs on Admission: I have personally reviewed following labs and imaging studies  CBC: Recent Labs  Lab 06/30/22 1741 06/30/22 1801  WBC 10.6*  --   NEUTROABS 8.6*  --   HGB 13.4 14.3  HCT 40.6 42.0  MCV 91.4  --   PLT 200  --    Basic Metabolic Panel: Recent Labs  Lab 06/30/22 1741 06/30/22 1801  NA 139 139  K 3.7 4.0  CL 108 105  CO2 16*  --   GLUCOSE 126* 128*  BUN 16 16  CREATININE 1.23 1.20  CALCIUM 8.3*  --    GFR: Estimated Creatinine Clearance: 59.7 mL/min (by C-G formula based on SCr of 1.2 mg/dL). Liver Function Tests: Recent Labs  Lab 06/30/22 1741  AST 26  ALT 17  ALKPHOS 74  BILITOT 0.8  PROT 6.1*  ALBUMIN 3.6   No results for input(s): "LIPASE", "AMYLASE" in the last  168 hours. No results for input(s): "AMMONIA" in the last 168 hours. Coagulation Profile: Recent Labs  Lab 06/30/22 1741  INR 1.2   Cardiac Enzymes: No results for input(s): "CKTOTAL", "CKMB", "CKMBINDEX", "TROPONINI" in the last 168 hours. BNP (last 3 results) No results for input(s): "PROBNP" in the last 8760 hours. HbA1C: No results for input(s): "HGBA1C" in the last 72 hours. CBG: Recent Labs  Lab 06/30/22 1724  GLUCAP 137*   Lipid Profile: No results for input(s): "CHOL", "HDL", "LDLCALC", "TRIG", "CHOLHDL", "LDLDIRECT" in the last 72 hours. Thyroid Function Tests: No results for input(s): "TSH", "T4TOTAL", "FREET4", "T3FREE", "THYROIDAB" in the last 72 hours. Anemia Panel: No results for input(s): "VITAMINB12", "FOLATE", "FERRITIN", "TIBC", "IRON", "RETICCTPCT" in the last 72 hours. Urine analysis:  Component Value Date/Time   COLORURINE YELLOW 07/17/2019 1520   APPEARANCEUR CLEAR 07/17/2019 1520   LABSPEC 1.018 07/17/2019 1520   PHURINE 6.0 07/17/2019 1520   GLUCOSEU NEGATIVE 07/17/2019 1520   HGBUR SMALL (A) 07/17/2019 1520   BILIRUBINUR NEGATIVE 07/17/2019 [place] 07/17/2019 1520   PROTEINUR 30 (A) 07/17/2019 1520   NITRITE POSITIVE (A) 07/17/2019 1520   LEUKOCYTESUR MODERATE (A) 07/17/2019 1520    Radiological Exams on Admission: CT HEAD CODE STROKE WO CONTRAST  Result Date: 06/30/2022 CLINICAL DATA:  Code stroke.  Stroke, follow up EXAM: CT HEAD WITHOUT CONTRAST TECHNIQUE: Contiguous axial images were obtained from the base of the skull through the vertex without intravenous contrast. RADIATION DOSE REDUCTION: This exam was performed according to the departmental dose-optimization program which includes automated exposure control, adjustment of the mA and/or kV according to patient size and/or use of iterative reconstruction technique. COMPARISON:  CT head 07/14/2019. FINDINGS: Brain: Encephalomalacia in the left parietal lobe, compatible  with the sequela of prior hemorrhage. Additional patchy white matter hypoattenuation, nonspecific but compatible with chronic microvascular ischemic disease. No evidence of acute large vascular territory infarct, acute hemorrhage, mass lesion, midline shift, or hydrocephalus Vascular: No hyperdense vessel identified. Calcific intracranial atherosclerosis. Skull: No acute fracture. Sinuses/Orbits: No acute findings. Other: Partially imaged rotation of C1 on C2. ASPECTS ([place] Stroke Program Early CT Score) Total score (0-10 with 10 being normal): 10. IMPRESSION: 1. No evidence of acute intracranial abnormality.  ASPECTS is 10. 2. Encephalomalacia in the left parietal lobe, compatible with the sequela of prior hemorrhage. 3. Chronic microvascular ischemic disease. 4. Partially imaged rotation of C1 on C2, probably positional in the absence of a fixed torticollis. Code stroke imaging results were communicated on 06/30/2022 at 5:29 pm to provider Bhagat via secure text paging. Electronically Signed   By: Margaretha Sheffield M.D.   On: 06/30/2022 17:30     EKG: Independently reviewed, with result as described above.    Assessment/Plan   Principal Problem:   Seizures ([place]) Active Problems:   Essential hypertension   Acute encephalopathy   Leukocytosis     #) Generalized tonic-clonic seizures: 2 such episodes witnessed in the emergency department today, first of which terminated with Ativan 2 mg IV x1 with ensuing improvement in mental status followed by a second episode of tonic-clonic activity, which terminated with Ativan 4 mg IV x1.  Has subsequently remained somnolent, but is also received 1 g of Depakote in the interval.  No evidence of residual tonic-clonic activity.  Dr. Curly Shores of neuro has consulted, and suspects influence from recently discontinued Keppra, upon which the patient had been without any seizures over the preceding 2 years.  No additional provoking features identified at this time.   Presenting CT head showed no evidence of acute process.  Overall, presentation does not appear consistent with status epilepticus at this time.   Neurology recommends resumption of home Keppra, MRI brain, stat EEG, as well as additional infectious work-up to evaluate mild leukocytosis, including chest x-ray.   Aside from recent discontinuation of Keppra, no overt pharmacologic influences, no history of recreational drug abuse nor alcohol abuse.  Clinically, no overt evidence of underlying infectious process at this time, although chest x-ray, urinalysis, and covid/flu pcr are pending at this time.   Plan: NPO until mental status improves sufficiently such that the patient is able to participate in/pass nursing bedside swallow screen. Seizure precautions. Prn IV Ativan for breakthrough seizure.  Monitor on telemetry. continuous  pulse-ox. Serial neuro checks. CMP/CBC in the AM. Check serum Mg level.  Check UDS, UA.  Q4 hour accu-checks x 3 occurrences to evaluate for any evidence of contributory hypoglycemia. Neurology consulted, as above.  Resume home Keppra.  MRI brain.  Stat EEG.  Chest x-ray. Check CPK per neurology recommendations.        #) Acute encephalopathy: Somnolence and diminished responsiveness following two episodes of  generalized tonic-clonic activity, with interval improvement in mental status in between these episodes, consistent with postictal state with additional pharmacologic implications from a total of 10 mg of IV Ativan as well as 1 g of Depakote.  No overt additional source identified at this time.  Does not appear septic and no e/o  underlying infectious process at this time, although ua, cxr, covid pcr are all currently pending.  No overt additional metabolic contribution, including no significant electrolyte abnormalities.   Per neurology recs, will also check MRI, which will also further evaluate for any underlying acute ischemic infarct.      Plan: Work-up and  management of presenting seizures, as above. Repeat CMP/CBC in AM. TSH. Fall precautions.  Follow-up results of chest x-ray, urinalysis, urinary drug screen, COVID-19/influenza PCR.  MRI brain, as above.         #) Leukocytosis: Mildly elevated white blood cell count 10,600.  Suspect that this is reactive in nature in the context of presenting to episodes of generalized tonic-clonic activity, especially representing an underlying infectious process.  Overall, SIRS criteria are currently met, and in the absence of any suspected underlying infection, patient does not appear septic at this time.  Consequently, we will refrain from initiation of IV antibiotics, and pursue additional infectious evaluation as outlined below.  Plan: Follow-up results of urinalysis, chest x-ray, COVID-19/influenza PCR.  Repeat CBC with differential in the morning.  Monitor strict I's and O's.            #) Essential Hypertension: documented h/o such, with outpatient antihypertensive regimen including amlodipine, metoprolol tartrate.  SBP's in the ED today: Normotensive.    Plan: Close monitoring of subsequent BP via routine VS. in the setting of current n.p.o. status, will hold home antihypertensive medications for now.  Follow for results of MRI brain.      DVT prophylaxis: SCD's   Code Status: Full code Family Communication: I discussed the patient's case with his wife and sister-in-law, with whom are present at bedside. Disposition Plan: Per Rounding Team Consults called: Dr. Curly Shores of Neurology has consulted, as further detailed above;  Admission status: Observation    PLEASE NOTE THAT DRAGON DICTATION SOFTWARE WAS USED IN THE CONSTRUCTION OF THIS NOTE.   [place] DO [facility]  From [place]   06/30/2022, 8:25 PM

## 2022-06-30 NOTE — Consult Note (Signed)
Neurology Consultation Reason for Consult: Code stroke Requesting Physician: Babs Bertin   CC: Aphasia  History is obtained from: Wife at bedside and chart review  HPI: Thomas Norman is a 71 y.o. male past medical history significant for left parietal intracerebral hemorrhage (06/2019, presenting with seizure), hypertension  Patient had confusion and was unable to speak or answer questions correctly with some right gaze preference.  While in triage she began to have seizure activity.  He was initially returning to baseline but was still quite agitated.  He was given 4 mg of Ativan to facilitate CT scan due to ongoing agitation (2 mg at a time), but subsequently had recurrent focal seizure activity for which he was given another 4 mg of Ativan.  He was subsequently loaded with 1 g of Depakote, as family had initially reported Keppra was stopped earlier in the spring due to him not liking the side effects of Keppra (and not having had any subsequent seizure activity).  Subsequently they clarified that he simply felt that he was a little bit clearer after he stopped the [place].  They noted he was just slightly irritable on Keppra but not severely so, and they would prefer Keppra over Depakote going forward given side effect profile and monitoring required  Wife notes that the presentation of his seizure was identical to his 1 prior seizure and that he had significant postictal agitation previously as well.  She denies that the patient had any poor sleep recently, any concern for substance use, notes that he drinks occasionally but typically only 1 or 2 beers at a time and his last drink was 2 days prior to admission, 1 beer with dinner.  She does not note that the patient has had any infectious symptoms recently (may have had a cold but recovered)  LKW: 1500 Thrombolytic given?: No, seizure and prior ICH IA performed?: No, not consistent with LVO Premorbid modified rankin scale:      0 - No  symptoms.  ROS: Unable to obtain due to altered mental status.   Past Medical History:  Diagnosis Date   Hypertension    Intracranial hemorrhage (HCC)    Kidney calculi    Seizures ([place])    Past Surgical History:  Procedure Laterality Date   LITHOTRIPSY     skin cancers     TONSILLECTOMY     Current Outpatient Medications  Medication Instructions   amLODipine (NORVASC) 2.5 mg, Oral, Daily   Coenzyme Q10 (CO Q 10 PO) 1 tablet, Oral, Daily   metoprolol tartrate (LOPRESSOR) 25 mg, Oral, 2 times daily   Multiple Vitamins-Minerals (VITAMIN D3 COMPLETE PO) 1 tablet, Oral, Daily   Omega-3 Fatty Acids (FISH OIL) 1000 MG CAPS Oral, 3 times weekly   Family History  Problem Relation Age of Onset   Diabetes Mother    Dementia Mother    Social History:  reports that he has never smoked. He has never used smokeless tobacco. He reports current alcohol use of about 1.0 standard drink of alcohol per week. He reports that he does not use drugs.   Exam: Current vital signs: BP 115/73   Pulse 74   Resp 19   Ht '5\' 11"'$  (1.803 m)   Wt 72.6 kg   SpO2 100%   BMI 22.32 kg/m  Vital signs in last 24 hours: Pulse Rate:  [67-86] 74 (09/07 1915) Resp:  [19-23] 19 (09/07 1915) BP: (107-120)/(68-83) 115/73 (09/07 1915) SpO2:  [97 %-100 %] 100 % (09/07 1915) Weight:  [  72.6 kg] 72.6 kg (09/07 1829)   Physical Exam  Constitutional: Appears well-developed and well-nourished.  Psych: Agitated postictally and very combative, biting and swinging at staff Eyes: No scleral injection HENT: No oropharyngeal obstruction.  MSK: no joint deformities.  Cardiovascular: Normal rate and regular rhythm. Perfusing extremities well Respiratory: Sonorous breathing postictally, which improved gradually GI: Soft.  No distension. There is no tenderness.  Skin: Warm dry and intact visible skin  Neuro: Mental Status: Severely impaired speech at best, did follow some commands at best Cranial Nerves: II: Visual  Fields are notable for a right field cut. Pupils are equal, round, and reactive to light.   III,IV, VI: Right gaze preference during seizure, left gaze preference postictally V: Facial sensation is symmetric to light eyelash brush VII: Facial movement is notable for right facial droop postictally.  VIII: hearing is intact to voice X: Uvula elevates symmetrically XI: Shoulder shrug is symmetric. XII: tongue is midline without atrophy or fasciculations.  Motor: Tone is normal. Bulk is normal.  Initially strength appeared grossly equal in all 4 extremities, after the second episode of seizure perhaps some subtle right-sided weakness (slightly less brisk response to noxious stimulation) Sensory: Equally reactive to touch in all 4 extremities  I have reviewed labs in epic and the results pertinent to this consultation are:  Basic Metabolic Panel: Recent Labs  Lab 06/30/22 1741 06/30/22 1801  NA 139 139  K 3.7 4.0  CL 108 105  CO2 16*  --   GLUCOSE 126* 128*  BUN 16 16  CREATININE 1.23 1.20  CALCIUM 8.3*  --     CBC: Recent Labs  Lab 06/30/22 1741 06/30/22 1801  WBC 10.6*  --   NEUTROABS 8.6*  --   HGB 13.4 14.3  HCT 40.6 42.0  MCV 91.4  --   PLT 200  --     Coagulation Studies: Recent Labs    06/30/22 1741  LABPROT 15.0  INR 1.2    Ethanol level undetectable  Flu panel negative  I have reviewed the images obtained: Head CT personally reviewed, agree with radiology: 1. No evidence of acute intracranial abnormality.  ASPECTS is 10. 2. Encephalomalacia in the left parietal lobe, compatible with the sequela of prior hemorrhage. 3. Chronic microvascular ischemic disease. 4. Partially imaged rotation of C1 on C2, probably positional in the absence of a fixed torticollis.   Impression: Focal motor status in the setting of discontinuation of seizure medications.  No other clear inciting etiology, favor leukocytosis is secondary to seizure activity but will complete  a basic infectious screen  Recommendations: -S/p Depakote 1 g loading dose and 8 mg of Ativan -Resume Keppra 500 mg twice daily -STAT EEG given patient is still quite sleepy to rule out ongoing seizure activity -Chest x-ray, UA, UDS for infectious work-up -MRI brain with and without to confirm no acute process  Lesleigh Noe MD-PhD [facility] [phone] Available 7 AM to 7 PM, outside these hours please contact Neurologist on call listed on AMION   Total critical care time: 60 minutes   Critical care time was exclusive of separately billable procedures and treating other patients.   Critical care was necessary to treat or prevent imminent or life-threatening deterioration.   Critical care was time spent personally by me on the following activities: development of treatment plan with patient and/or surrogate as well as nursing, discussions with consultants/primary team, evaluation of patient's response to treatment, examination of patient, obtaining history from patient or surrogate, ordering  and performing treatments and interventions, ordering and review of laboratory studies, ordering and review of radiographic studies, and re-evaluation of patient's condition as needed, as documented above.

## 2022-06-30 NOTE — Progress Notes (Signed)
EEG complete - results pending 

## 2022-06-30 NOTE — ED Triage Notes (Signed)
Pt brought in by his wife for sudden onset AMS, pt's wife last saw him  normal at 1500. Pt has confusion, unable to form sentences, answer questions appropriately. Pt had hx of hemorrhagic stroke w/ seizure in 2020, had a full recovery w/ no deficits. Pt in triage altered, no drift noted. Pt was attempting to answer questions and began seizing, lasted approx 1 min. Pt brought back to room for ED evaluation, neurology & stroke team present in triage.

## 2022-06-30 NOTE — ED Notes (Signed)
Pt noted to have redness lying on the CT table, head to the right side and gazes fixed to the right. Pt not responsive to stimul. Pt breathing even and unlabored. Provider aware and at bedside. Orders obtained.

## 2022-07-01 ENCOUNTER — Inpatient Hospital Stay (HOSPITAL_COMMUNITY): Payer: Medicare Other

## 2022-07-01 DIAGNOSIS — G9340 Encephalopathy, unspecified: Secondary | ICD-10-CM | POA: Diagnosis not present

## 2022-07-01 DIAGNOSIS — R569 Unspecified convulsions: Secondary | ICD-10-CM

## 2022-07-01 DIAGNOSIS — I10 Essential (primary) hypertension: Secondary | ICD-10-CM | POA: Diagnosis not present

## 2022-07-01 DIAGNOSIS — D72829 Elevated white blood cell count, unspecified: Secondary | ICD-10-CM | POA: Diagnosis not present

## 2022-07-01 LAB — CBC WITH DIFFERENTIAL/PLATELET
Abs Immature Granulocytes: 0.03 10*3/uL (ref 0.00–0.07)
Basophils Absolute: 0 10*3/uL (ref 0.0–0.1)
Basophils Relative: 0 %
Eosinophils Absolute: 0.1 10*3/uL (ref 0.0–0.5)
Eosinophils Relative: 1 %
HCT: 38.3 % — ABNORMAL LOW (ref 39.0–52.0)
Hemoglobin: 13.4 g/dL (ref 13.0–17.0)
Immature Granulocytes: 0 %
Lymphocytes Relative: 19 %
Lymphs Abs: 2 10*3/uL (ref 0.7–4.0)
MCH: 30.5 pg (ref 26.0–34.0)
MCHC: 35 g/dL (ref 30.0–36.0)
MCV: 87 fL (ref 80.0–100.0)
Monocytes Absolute: 0.9 10*3/uL (ref 0.1–1.0)
Monocytes Relative: 9 %
Neutro Abs: 7.5 10*3/uL (ref 1.7–7.7)
Neutrophils Relative %: 71 %
Platelets: 180 10*3/uL (ref 150–400)
RBC: 4.4 MIL/uL (ref 4.22–5.81)
RDW: 11.9 % (ref 11.5–15.5)
WBC: 10.5 10*3/uL (ref 4.0–10.5)
nRBC: 0 % (ref 0.0–0.2)

## 2022-07-01 LAB — GLUCOSE, CAPILLARY
Glucose-Capillary: 90 mg/dL (ref 70–99)
Glucose-Capillary: 93 mg/dL (ref 70–99)
Glucose-Capillary: 114 mg/dL — ABNORMAL HIGH (ref 70–99)

## 2022-07-01 LAB — URINALYSIS, ROUTINE W REFLEX MICROSCOPIC
Bilirubin Urine: NEGATIVE
Glucose, UA: NEGATIVE mg/dL
Hgb urine dipstick: NEGATIVE
Ketones, ur: 5 mg/dL — AB
Leukocytes,Ua: NEGATIVE
Nitrite: NEGATIVE
Protein, ur: NEGATIVE mg/dL
Specific Gravity, Urine: 1.019 (ref 1.005–1.030)
pH: 5 (ref 5.0–8.0)

## 2022-07-01 LAB — COMPREHENSIVE METABOLIC PANEL
ALT: 17 U/L (ref 0–44)
AST: 30 U/L (ref 15–41)
Albumin: 3.5 g/dL (ref 3.5–5.0)
Alkaline Phosphatase: 71 U/L (ref 38–126)
Anion gap: 10 (ref 5–15)
BUN: 12 mg/dL (ref 8–23)
CO2: 23 mmol/L (ref 22–32)
Calcium: 8.7 mg/dL — ABNORMAL LOW (ref 8.9–10.3)
Chloride: 106 mmol/L (ref 98–111)
Creatinine, Ser: 1.2 mg/dL (ref 0.61–1.24)
GFR, Estimated: >60 mL/min (ref >60)
Glucose, Bld: 100 mg/dL — ABNORMAL HIGH (ref 70–99)
Potassium: 3.7 mmol/L (ref 3.5–5.1)
Sodium: 139 mmol/L (ref 135–145)
Total Bilirubin: 0.7 mg/dL (ref 0.3–1.2)
Total Protein: 6.2 g/dL — ABNORMAL LOW (ref 6.5–8.1)

## 2022-07-01 LAB — MAGNESIUM: Magnesium: 2.2 mg/dL (ref 1.7–2.4)

## 2022-07-01 LAB — TSH: TSH: 1.312 u[IU]/mL (ref 0.350–4.500)

## 2022-07-01 LAB — CK: Total CK: 630 U/L — ABNORMAL HIGH (ref 49–397)

## 2022-07-01 MED ORDER — ORAL CARE MOUTH RINSE: 15.0000 mL | OROMUCOSAL | Status: DC | PRN

## 2022-07-01 MED ORDER — LEVETIRACETAM 500 MG PO TABS
500.0000 mg | ORAL_TABLET | Freq: Two times a day (BID) | ORAL | Status: DC
  Administered 2022-07-01 – 2022-07-02 (×2): 500 mg via ORAL
  Filled 2022-07-01 (×2): qty 1

## 2022-07-01 MED ORDER — LEVETIRACETAM 500 MG PO TABS: 500.0000 mg | ORAL_TABLET | Freq: Two times a day (BID) | ORAL | 2 refills | Status: DC

## 2022-07-01 MED ORDER — AMLODIPINE BESYLATE 2.5 MG PO TABS: 2.5000 mg | ORAL_TABLET | Freq: Every day | ORAL | 2 refills | Status: AC

## 2022-07-01 MED ORDER — GADOBUTROL 1 MMOL/ML IV SOLN
7.0000 mL | Freq: Once | INTRAVENOUS | Status: AC | PRN
Start: 2022-07-01 — End: 2022-07-01
  Administered 2022-07-01: 7 mL via INTRAVENOUS

## 2022-07-01 MED ORDER — METOPROLOL TARTRATE 25 MG PO TABS: 25.0000 mg | ORAL_TABLET | Freq: Two times a day (BID) | ORAL | 2 refills | Status: AC

## 2022-07-01 NOTE — Plan of Care (Signed)
  Problem: Education: Goal: Knowledge of General Education information will improve Description: Including pain rating scale, medication(s)/side effects and non-pharmacologic comfort measures Outcome: Progressing   Problem: Health Behavior/Discharge Planning: Goal: Ability to manage health-related needs will improve Outcome: Progressing   Problem: Clinical Measurements: Goal: Ability to maintain clinical measurements within normal limits will improve Outcome: Progressing Goal: Will remain free from infection Outcome: Progressing Goal: Diagnostic test results will improve Outcome: Progressing Goal: Respiratory complications will improve Outcome: Progressing Goal: Cardiovascular complication will be avoided Outcome: Progressing   Problem: Activity: Goal: Risk for activity intolerance will decrease Outcome: Progressing   Problem: Nutrition: Goal: Adequate nutrition will be maintained Outcome: Progressing   Problem: Coping: Goal: Level of anxiety will decrease Outcome: Progressing   Problem: Elimination: Goal: Will not experience complications related to bowel motility Outcome: Progressing Goal: Will not experience complications related to urinary retention Outcome: Progressing   Problem: Pain Managment: Goal: General experience of comfort will improve Outcome: Progressing   Problem: Safety: Goal: Ability to remain free from injury will improve Outcome: Progressing   Problem: Skin Integrity: Goal: Risk for impaired skin integrity will decrease Outcome: Progressing   Problem: Coping: Goal: Ability to adjust to condition or change in health will improve Outcome: Progressing   Problem: Health Behavior/Discharge Planning: Goal: Compliance with prescribed medication regimen will improve Outcome: Progressing   Problem: Medication: Goal: Risk for medication side effects will decrease Outcome: Progressing   Problem: Safety: Goal: Verbalization of understanding the  information provided will improve Outcome: Progressing   Problem: Self-Concept: Goal: Level of anxiety will decrease Outcome: Progressing

## 2022-07-01 NOTE — Procedures (Signed)
Patient Name: Thomas Norman  MRN: [id number]  Epilepsy Attending: Lora Havens  Referring Physician/Provider: Lorenza Chick, MD  Date: 07/01/2022 Duration: 25.34 mins  Patient history:  71 y.o. 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  EEG to evaluate for seizure.  Level of alertness: Awake, asleep  AEDs during EEG study: LEV  Technical aspects: This EEG study was done with scalp electrodes positioned according to the 10-20 International system of electrode placement. Electrical activity was reviewed with band pass filter of 1-'70Hz'$ , sensitivity of 7 uV/mm, display speed of 49m/sec with a '60Hz'$  notched filter applied as appropriate. EEG data were recorded continuously and digitally stored.  Video monitoring was available and reviewed as appropriate.  Description: The posterior dominant rhythm consists of 8-9 Hz activity of moderate voltage (25-35 uV) seen predominantly in posterior head regions, symmetric and reactive to eye opening and eye closing. Sleep was characterized by vertex waves, sleep spindles (12 to 14 Hz), maximal frontocentral region.  Hyperventilation and photic stimulation were not performed.     IMPRESSION: This study is within normal limits. No seizures or epileptiform discharges were seen throughout the recording.  A normal interictal EEG does not exclude nor support the diagnosis of epilepsy.   Thomas Norman

## 2022-07-01 NOTE — Evaluation (Signed)
Physical Therapy Evaluation Patient Details Name: Thomas Norman MRN: [id number] DOB: Jul 15, 1951 Today's Date: 07/01/2022  History of Present Illness  71 y.o. male presents to [facility] hospital on 06/30/2022 with confusion, R gaze preference, and witnessed tonic-clonic seizure activity. PMH includes L parietal [place] 2020, seizures, HTN.  Clinical Impression  Pt presents to PT with deficits in balance, cognition, gait, awareness. Pt with significant balance deviations, swaying and staggering laterally during ambulation with reduced ability to correct for balance deviations. Pt appears easily distracted and demonstrates difficulty following multi-step commands at this time. Gait and balance quality are improved with UE support of a RW, however the pt remains far from his baseline. PT recommends discharge home with outpatient PT, a RW, and a shower seat. Pt will benefit from assistance for all out of bed mobility.       Recommendations for follow up therapy are one component of a multi-disciplinary discharge planning process, led by the attending physician.  Recommendations may be updated based on patient status, additional functional criteria and insurance authorization.  Follow Up Recommendations Outpatient PT      Assistance Recommended at Discharge Frequent or constant Supervision/Assistance  Patient can return home with the following  A lot of help with walking and/or transfers;A little help with bathing/dressing/bathroom;Assistance with cooking/housework;Direct supervision/assist for medications management;Direct supervision/assist for financial management;Assist for transportation;Help with stairs or ramp for entrance    Equipment Recommendations Rolling walker (2 wheels);Other (comment) (shower seat)  Recommendations for Other Services       Functional Status Assessment Patient has had a recent decline in their functional status and demonstrates the ability to make significant improvements in  function in a reasonable and predictable amount of time.     Precautions / Restrictions Precautions Precautions: Fall Precaution Comments: seizure Restrictions Weight Bearing Restrictions: No      Mobility  Bed Mobility Overal bed mobility: Needs Assistance Bed Mobility: Supine to Sit, Sit to Supine     Supine to sit: Supervision Sit to supine: Supervision        Transfers Overall transfer level: Needs assistance Equipment used: None Transfers: Sit to/from Stand Sit to Stand: Min guard                Ambulation/Gait Ambulation/Gait assistance: Min assist, Mod assist Gait Distance (Feet): 400 Feet (400' without device, 250' with RW) Assistive device: Rolling walker (2 wheels), None Gait Pattern/deviations: Step-through pattern, Drifts right/left, Staggering right, Staggering left Gait velocity: reduced Gait velocity interpretation: <1.8 ft/sec, indicate of risk for recurrent falls   General Gait Details: pt with increased lateral sway, losing balance laterally to both sides along with posteriorly during ambulation. Pt with increased difficulty maintaining balance with head turns or changes in direction. Pt demonstrates improved stability with use of walker, utilizing UE suppor to correct for balance deviations during session  Stairs            Wheelchair Mobility    Modified Rankin (Stroke Patients Only)       Balance Overall balance assessment: Needs assistance Sitting-balance support: No upper extremity supported, Feet supported Sitting balance-Leahy Scale: Good     Standing balance support: No upper extremity supported, During functional activity Standing balance-Leahy Scale: Poor Standing balance comment: min-modA               High Level Balance Comments: static standing eyes closed pt with large posterior loss of balance requiring PT to correct within 10 seconds  Pertinent Vitals/Pain Pain Assessment Pain Assessment:  No/denies pain    Home Living Family/patient expects to be discharged to:: Private residence Living Arrangements: Spouse/significant other Available Help at Discharge: Family;Available 24 hours/day Type of Home: House Home Access: Stairs to enter Entrance Stairs-Rails: None Entrance Stairs-Number of Steps: 1   Home Layout: One level Home Equipment: Rollator (4 wheels)      Prior Function Prior Level of Function : Independent/Modified Independent;Driving             Mobility Comments: retired, independent and active       Journalist, newspaper   Dominant Hand: Right    Extremity/Trunk Assessment   Upper Extremity Assessment Upper Extremity Assessment: Overall WFL for tasks assessed    Lower Extremity Assessment Lower Extremity Assessment: Generalized weakness (intermittent foot drag and instances of knee buckling during ambulation)    Cervical / Trunk Assessment Cervical / Trunk Assessment: Normal  Communication   Communication: Expressive difficulties (2 instances of appeard word finding difficulties)  Cognition Arousal/Alertness: Awake/alert Behavior During Therapy: WFL for tasks assessed/performed Overall Cognitive Status: Impaired/Different from baseline Area of Impairment: Following commands, Safety/judgement, Awareness, Problem solving                       Following Commands: Follows one step commands consistently, Follows multi-step commands inconsistently Safety/Judgement: Decreased awareness of safety, Decreased awareness of deficits Awareness: Emergent Problem Solving: Difficulty sequencing          General Comments General comments (skin integrity, edema, etc.): VSS on RA    Exercises     Assessment/Plan    PT Assessment Patient needs continued PT services  PT Problem List Decreased balance;Decreased mobility;Decreased cognition;Decreased knowledge of use of DME;Decreased safety awareness;Decreased knowledge of precautions       PT  Treatment Interventions DME instruction;Gait training;Stair training;Functional mobility training;Therapeutic activities;Therapeutic exercise;Balance training;Neuromuscular re-education;Patient/family education;Cognitive remediation    PT Goals (Current goals can be found in the Care Plan section)  Additional Goals Additional Goal #1: Pt will score >19/24 on the DGI to indicate a reduced risk for falls Additional Goal #2: Pt will score >45/56 on the BERG to indicate a reduced risk for falls    Frequency Min 3X/week     Co-evaluation               AM-PAC PT "6 Clicks" Mobility  Outcome Measure Help needed turning from your back to your side while in a flat bed without using bedrails?: A Little Help needed moving from lying on your back to sitting on the side of a flat bed without using bedrails?: A Little Help needed moving to and from a bed to a chair (including a wheelchair)?: A Little Help needed standing up from a chair using your arms (e.g., wheelchair or bedside chair)?: A Little Help needed to walk in hospital room?: A Little Help needed climbing 3-5 steps with a railing? : Total 6 Click Score: 16    End of Session   Activity Tolerance: Patient tolerated treatment well Patient left: in bed;with call bell/phone within reach;with family/visitor present Nurse Communication: Mobility status PT Visit Diagnosis: Other abnormalities of gait and mobility (R26.89);Other symptoms and signs involving the nervous system (X91.478)    Time: 1500-1530     Charges:   $1 Low Eval          Zenaida Niece, PT, DPT Acute Rehabilitation Office [phone]   Zenaida Niece 07/01/2022, 4:22 PM

## 2022-07-01 NOTE — Hospital Course (Addendum)
Thomas Norman is a 71 y.o. male with past medical history significant for left parietal intracranial hemorrhage in September 2020, seizures, essential hypertension, presented to hospital with altered mental status.  He had acute confusion and trouble finding correct words.  While in the triage patient had a witnessed tonic-clonic seizure involving all 4 extremities and received Ativan 2 mg IV x1.  He was subsequently somnolent, with diminished responsiveness.  Patient did have an additional witnessed episode of generalized tonic-clonic activity, which resolved following dose of Ativan 4 mg IV x1.  He also received Depakote 1000 mg IV x1.  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  Follows up with [facility] neurology.  Does admit to increasing alcohol intake but no recreational drug use.  In the ED, creatinine 1.23, albumin 3.6; otherwise, liver enzymes within normal limits.  CBC notable for will with cell count 10,600.  INR 1.2.  EKG shows sinus rhythm  CT head  showed evidence of encephalomalacia in the left parietal lobe, compatible with sequela of prior hemorrhage, and also demonstrated evidence of chronic microvascular ischemic disease.  Neurology was consulted and patient [facility] for further evaluation and treatment.  Assessment and plan.  Principal Problem:   Seizures ([place]) Active Problems:   Essential hypertension   Acute encephalopathy   Leukocytosis   Seizure (HCC)   Generalized tonic-clonic seizures:  Witnessed seizures in the ED.  Neurology was consulted.  Recommended resumption of home Keppra.  Patient will continue Keppra 500 mg twice daily on discharge.  EEG which was within normal limits.  MRI of the brain showed no acute intracranial abnormality.   Acute metabolic encephalopathy: likely postictal state effect of benzodiazepines and sedation.  Patient is oriented at this  time slightly slow. MRI of the brain without any acute findings.  Urinalysis did not show any evidence of UTI.  COVID and influenza was negative.   Leukocytosis: likely reactive.  Chest x-ray with mild left basilar atelectasis/infiltrate.  Leukocytosis has improved at this time.  Urinalysis without infection.   Essential Hypertension: On amlodipine, metoprolol at home.      Debility, weakness.  We will get PT evaluation.

## 2022-07-01 NOTE — Progress Notes (Signed)
Neurology Progress Note  Brief HPI: 71 y.o. male with PMHx of left parietal ICH 06/2019 with initial presentation of seizure and hypertension who presented to the ED 9/7 for evaluation of acute onset of confusion, right gaze preference, and then with witnessed tonic-clonic seizure activity in triage with post-ictal agitation. Seizure activity and agitation resolved with Ativan in addition to a 1g Depakote load. Patient previously on Wilder at home with discontinuation earlier in the spring due to slight irritation side effects. Presentation on 9/7 is felt to be similar to presentation in 2020 with seizure and post-ictal agitation.   Subjective: Patient in MRI on first attempt at assessment, MRI brain without evidence of acute abnormality UA pending  Exam: Vitals:   07/01/22 0408 07/01/22 0802  BP: 107/74 103/67  Pulse: 67 62  Resp: 15 18  Temp: 98.2 F (36.8 C) 98.1 F (36.7 C)  SpO2: 98% 97%   Gen: Laying comfortably in bed, in no acute distress Resp: non-labored breathing, no respiratory distress on room air, no audible wheezing Abd: soft, non-tender, non-distended  Neuro: Mental Status: Awake, confused. Oriented to self but incorrectly states that it is "the Trump year" and 2014 when asked the year and incorrectly states that he is 44 initially when asked his age (states he is joking and provides his correct age when asked again). He does correctly state that the month is September and is able to state that he is in the hospital for a reported seizure event. He does recall having an argument with his wife in the car prior to admission. Responses are delayed.  He has trouble following 2-step commands without repeat instruction and demonstration.  Speech is without dysarthria or aphasia.  Cranial Nerves: PERRL, EOMI, face symmetric resting and with movement, facial sensation intact and symmetric to light touch, hearing is intact to voice, tongue and palate are midline, shoulders shrug  symmetrically.  Motor: Moves all extremities antigravity without vertical drift, no asymmetry noted Sensory: Sensation to light touch intact and symmetric throughout Gait: Deferred  Pertinent Labs: CBC    Component Value Date/Time   WBC 10.5 07/01/2022 0024   RBC 4.40 07/01/2022 0024   HGB 13.4 07/01/2022 0024   HGB 14.6 02/02/2021 1408   HCT 38.3 (L) 07/01/2022 0024   HCT 42.7 02/02/2021 1408   PLT 180 07/01/2022 0024   PLT 240 02/02/2021 1408   MCV 87.0 07/01/2022 0024   MCV 88 02/02/2021 1408   MCH 30.5 07/01/2022 0024   MCHC 35.0 07/01/2022 0024   RDW 11.9 07/01/2022 0024   RDW 12.5 02/02/2021 1408   LYMPHSABS 2.0 07/01/2022 0024   LYMPHSABS 1.7 02/02/2021 1408   MONOABS 0.9 07/01/2022 0024   EOSABS 0.1 07/01/2022 0024   EOSABS 0.1 02/02/2021 1408   BASOSABS 0.0 07/01/2022 0024   BASOSABS 0.1 02/02/2021 1408   CMP     Component Value Date/Time   NA 139 07/01/2022 0024   K 3.7 07/01/2022 0024   CL 106 07/01/2022 0024   CO2 23 07/01/2022 0024   GLUCOSE 100 (H) 07/01/2022 0024   BUN 12 07/01/2022 0024   CREATININE 1.20 07/01/2022 0024   CALCIUM 8.7 (L) 07/01/2022 0024   PROT 6.2 (L) 07/01/2022 0024   ALBUMIN 3.5 07/01/2022 0024   AST 30 07/01/2022 0024   ALT 17 07/01/2022 0024   ALKPHOS 71 07/01/2022 0024   BILITOT 0.7 07/01/2022 0024   GFRNONAA >60 07/01/2022 0024   GFRAA >60 07/16/2019 3810   Urinalysis Pending  Drugs  of Abuse     Component Value Date/Time   LABOPIA NONE DETECTED 07/14/2019 1155   COCAINSCRNUR NONE DETECTED 07/14/2019 1155   LABBENZ NONE DETECTED 07/14/2019 1155   AMPHETMU NONE DETECTED 07/14/2019 1155   THCU NONE DETECTED 07/14/2019 1155   LABBARB NONE DETECTED 07/14/2019 1155   Alcohol Level    Component Value Date/Time   ETH <10 06/30/2022 1741    Flu panel negative  Imaging Reviewed:  Head CT personally reviewed, agree with radiology: 1. No evidence of acute intracranial abnormality.  ASPECTS is 10. 2. Encephalomalacia  in the left parietal lobe, compatible with the sequela of prior hemorrhage. 3. Chronic microvascular ischemic disease. 4. Partially imaged rotation of C1 on C2, probably positional in the absence of a fixed torticollis.  EEG 9/8: "This study is within normal limits. No seizures or epileptiform discharges were seen throughout the recording. A normal interictal EEG does not exclude nor support the diagnosis of epilepsy."  MRI brain 9/8, reviewed by attending MD: 1. No acute intracranial abnormality. 2. Progression of nonspecific chronic white matter changes when compared to MRI performed in January 2021, now mild-to-moderate. 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  Impression: Focal motor status in the setting of discontinuation of seizure medications. No current evidence of infection, UA pending.   Recommendations: - Resume Keppra 500 mg BID, can transition to PO  - Ativan 2 mg IV PRN for seizure activity lasting > 3 minutes and notify neurology - Seizure precautions - UA pending to evaluate for infectious triggers - No further inpatient neurology recommendations at this time.  - Follow up with outpatient neurologist  Discussed [facility] statutes, patients with seizures are not allowed to drive until they have been seizure-free for six months. Use caution when using heavy equipment or power tools. Avoid working on ladders or at heights. Take showers instead of baths. Ensure the water temperature is not too high on the home water heater. Do not go swimming alone. Do not lock yourself in a room alone (i.e. bathroom). When caring for infants or small children, sit down when holding, feeding, or changing them to minimize risk of injury to the child in the event you have a seizure. Maintain good sleep hygiene. Avoid alcohol.   Anibal Henderson, AGACNP-BC [facility] [phone]  Attending Neurologist's note:  I personally saw this patient, gathering  history, performing a neurologic examination, reviewing relevant labs, personally reviewing relevant imaging including MRI brain, and formulated the assessment and plan, adding the note above for completeness and clarity to accurately reflect my thoughts  I personally spent > 50 min in care of this patient today, the majority of which was at bedside and examination and review of the patient's clinical course, in discussion of seizure medications and side effects, and the importance of seizure precautions  [place] [phone]

## 2022-07-01 NOTE — TOC Transition Note (Signed)
Transition of Care [facility]) - CM/SW Discharge Note   Patient Details  Name: KDYN VONBEHREN MRN: [id number] Date of Birth: 03-15-1951  [facility] CM/SW Contact:  Pollie Friar, RN Phone Number: 07/01/2022, 2:15 PM   Clinical Narrative:    Pt is discharging home with self care. Pt has supervision at home and transportation to home.  Pt denies noncompliance with home medications.    Final next level of care: Home/Self Care Barriers to Discharge: No Barriers Identified   Patient Goals and CMS Choice        Discharge Placement                       Discharge Plan and Services                                     Social Determinants of Health (SDOH) Interventions     Readmission Risk Interventions     No data to display

## 2022-07-01 NOTE — Progress Notes (Signed)
PROGRESS NOTE    Thomas Norman  IEP:[id number] DOB: 07/30/1951 DOA: 06/30/2022 PCP: Gaynelle Arabian, MD    Brief Narrative:  Thomas Norman is a 71 y.o. 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  He had acute confusion and trouble finding correct words.  While in the triage, patient had a witnessed tonic-clonic seizure involving all 4 extremities and received Ativan 2 mg IV x1.  He was subsequently somnolent, with diminished responsiveness.  Patient did have an additional witnessed episode of generalized tonic-clonic activity, which resolved following dose of Ativan 4 mg IV x1.  He also received Depakote 1000 mg IV x1.  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  Follows up with [facility] neurology.  Does admit to increasing alcohol intake but no recreational drug use.  In the ED, creatinine 1.23, albumin 3.6; otherwise, liver enzymes within normal limits.  CBC notable for will with cell count 10,600.  INR 1.2.  EKG showed sinus rhythm  CT head  showed evidence of encephalomalacia in the left parietal lobe, compatible with sequela of prior hemorrhage, and also demonstrated evidence of chronic microvascular ischemic disease.  Neurology was consulted and patient was admitted to the hospital for further evaluation and treatment.  Assessment and plan.  Principal Problem:   Seizures ([place]) Active Problems:   Essential hypertension   Acute encephalopathy   Leukocytosis   Seizure (H[facility])   Generalized tonic-clonic seizures:  Witnessed seizures in the ED.  Neurology was consulted.  Recommended resumption of home Keppra.  Patient will continue Keppra 500 mg twice daily on discharge.  EEG was performed which was within normal limits.  MRI of the brain showed no  acute intracranial abnormality.   Acute metabolic encephalopathy: likely postictal state effect of benzodiazepines and sedation.  Patient is oriented at this time slightly slow. MRI of the brain without any acute findings.  Urinalysis did not show any evidence of UTI.  COVID and influenza was negative.   Leukocytosis: likely reactive.  Chest x-ray with mild left basilar atelectasis/infiltrate.  Leukocytosis has improved at this time.  Urinalysis without infection.   Essential Hypertension: On amlodipine, metoprolol at home.      Debility, weakness.  Physical therapy has seen the patient today and patient is very unsteady and unsafe for discharge.  Will likely need to follow-up with PT again tomorrow.      DVT prophylaxis: SCDs Start: 06/30/22 1937   Code Status:     Code Status: Full Code  Disposition: Home with home health likely on 07/02/2022  Status is: Inpatient'  Remains inpatient appropriate because: Status post stroke, PT reevaluation,   Family Communication: Communicated with the patient's wife at bedside  Consultants:  Neurology  Procedures:  EEG  Antimicrobials:  None  Anti-infectives (From admission, onward)    None        Subjective: Today, patient was seen and examined at bedside.  Patient denies any urinary urgency, frequency or dysuria.  No fever or chills.  Feels unsteady.  Physical therapy reported that patient was very unsteady on his feet  Objective: Vitals:   07/01/22 0802 07/01/22 1206 07/01/22 1527 07/01/22 1546  BP: 103/67 103/75 (!) 129/90 119/79  Pulse: 62 76 98 84  Resp: '18 18 18   '$ Temp: 98.1 F (36.7 C)  98 F (36.7 C)  TempSrc: Oral     SpO2: 97% 98%  96%  Weight:      Height:       No intake or output data in the 24 hours ending 07/01/22 1600 Filed Weights   06/30/22 1829  Weight: 72.6 kg    Physical Examination: Body mass index is 22.32 kg/m.   General:  Average built, not in obvious distress HENT:   No scleral pallor  or icterus noted. Oral mucosa is moist.  Chest:  Clear breath sounds.  Diminished breath sounds bilaterally. No crackles or wheezes.  CVS: S1 &S2 heard. No murmur.  Regular rate and rhythm. Abdomen: Soft, nontender, nondistended.  Bowel sounds are heard.   Extremities: No cyanosis, clubbing or edema.  Peripheral pulses are palpable. Psych: Alert, awake and oriented, normal mood CNS:  No cranial nerve deficits.  Moves all extremities. Skin: Warm and dry.  No rashes noted.  Data Reviewed:   CBC: Recent Labs  Lab 06/30/22 1741 06/30/22 1801 07/01/22 0024  WBC 10.6*  --  10.5  NEUTROABS 8.6*  --  7.5  HGB 13.4 14.3 13.4  HCT 40.6 42.0 38.3*  MCV 91.4  --  87.0  PLT 200  --  180     Basic Metabolic Panel: Recent Labs  Lab 06/30/22 1741 06/30/22 1750 06/30/22 1801 07/01/22 0024  NA 139  --  139 139  K 3.7  --  4.0 3.7  CL 108  --  105 106  CO2 16*  --   --  23  GLUCOSE 126*  --  128* 100*  BUN 16  --  16 12  CREATININE 1.23  --  1.20 1.20  CALCIUM 8.3*  --   --  8.7*  MG  --  2.4  --  2.2     Liver Function Tests: Recent Labs  Lab 06/30/22 1741 07/01/22 0024  AST 26 30  ALT 17 17  ALKPHOS 74 71  BILITOT 0.8 0.7  PROT 6.1* 6.2*  ALBUMIN 3.6 3.5      Radiology Studies: MR BRAIN W WO CONTRAST  Result Date: 07/01/2022 CLINICAL DATA:  Seizure disorder, clinical change. EXAM: MRI HEAD WITHOUT AND WITH CONTRAST TECHNIQUE: Multiplanar, multiecho pulse sequences of the brain and surrounding structures were obtained without and with intravenous contrast. CONTRAST:  25m GADAVIST GADOBUTROL 1 MMOL/ML IV SOLN COMPARISON:  Head CT June 30, 2022; MRI of the brain November 12, 2019. FINDINGS: Brain: No acute infarction, hemorrhage, hydrocephalus, extra-axial collection or mass lesion. No interval change of the area of encephalomalacia and gliosis with associated hemosiderin staining in the left parietal region. Remote small right cerebellar infarct. Small to moderate amount  of scattered foci of T2 hyperintensity within the white matter of the cerebral hemispheres and within the pons, nonspecific, progressed when compared to prior MRI. Mesial temporal lobes are symmetric and with normal signal characteristics. No focus of abnormal contrast enhancement identified. Vascular: Normal flow voids. Skull and upper cervical spine: Normal marrow signal. Sinuses/Orbits: Negative. Other: None. IMPRESSION: 1. No acute intracranial abnormality. 2. Progression of nonspecific chronic white matter changes when compared to MRI performed in January 2021, now mild-to-moderate. 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 Electronically Signed   By: KPedro EarlsM.D.   On: 07/01/2022 09:42   EEG adult  Result Date: 07/01/2022 YLora Havens MD     07/01/2022  8:39 AM Patient Name: WDAILEN MCCLISHMRN: [id number] Attending: PLora HavensReferring Physician/Provider: BLesleigh Noe  L, MD Date: 07/01/2022 Duration: 25.34 mins Patient history:  71 y.o. 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  EEG to evaluate for seizure. Level of alertness: Awake, asleep AEDs during EEG study: LEV Technical aspects: This EEG study was done with scalp electrodes positioned according to the 10-20 International system of electrode placement. Electrical activity was reviewed with band pass filter of 1-'70Hz'$ , sensitivity of 7 uV/mm, display speed of 45m/sec with a '60Hz'$  notched filter applied as appropriate. EEG data were recorded continuously and digitally stored.  Video monitoring was available and reviewed as appropriate. Description: The posterior dominant rhythm consists of 8-9 Hz activity of moderate voltage (25-35 uV) seen predominantly in posterior head regions, symmetric and reactive to eye opening and eye closing. Sleep was characterized by vertex waves, sleep spindles (12 to 14 Hz), maximal  frontocentral region.  Hyperventilation and photic stimulation were not performed.   IMPRESSION: This study is within normal limits. No seizures or epileptiform discharges were seen throughout the recording. A normal interictal EEG does not exclude nor support the diagnosis of epilepsy. PLora Havens  DG CHEST PORT 1 VIEW  Result Date: 06/30/2022 CLINICAL DATA:  Altered mental status and weakness. EXAM: PORTABLE CHEST 1 VIEW COMPARISON:  July 17, 2019 FINDINGS: The heart size and mediastinal contours are within normal limits. Mildly decreased lung volumes are seen. Mild atelectasis and/or early infiltrate is noted within the left lung base. There is no evidence of a pleural effusion or pneumothorax. The visualized skeletal structures are unremarkable. IMPRESSION: Mild left basilar atelectasis and/or early infiltrate. Electronically Signed   By: TVirgina NorfolkM.D.   On: 06/30/2022 22:26   CT HEAD CODE STROKE WO CONTRAST  Result Date: 06/30/2022 CLINICAL DATA:  Code stroke.  Stroke, follow up EXAM: CT HEAD WITHOUT CONTRAST TECHNIQUE: Contiguous axial images were obtained from the base of the skull through the vertex without intravenous contrast. RADIATION DOSE REDUCTION: This exam was performed according to the departmental dose-optimization program which includes automated exposure control, adjustment of the mA and/or kV according to patient size and/or use of iterative reconstruction technique. COMPARISON:  CT head 07/14/2019. FINDINGS: Brain: Encephalomalacia in the left parietal lobe, compatible with the sequela of prior hemorrhage. Additional patchy white matter hypoattenuation, nonspecific but compatible with chronic microvascular ischemic disease. No evidence of acute large vascular territory infarct, acute hemorrhage, mass lesion, midline shift, or hydrocephalus Vascular: No hyperdense vessel identified. Calcific intracranial atherosclerosis. Skull: No acute fracture. Sinuses/Orbits: No  acute findings. Other: Partially imaged rotation of C1 on C2. ASPECTS ([place] Program Early CT Score) Total score (0-10 with 10 being normal): 10. IMPRESSION: 1. No evidence of acute intracranial abnormality.  ASPECTS is 10. 2. Encephalomalacia in the left parietal lobe, compatible with the sequela of prior hemorrhage. 3. Chronic microvascular ischemic disease. 4. Partially imaged rotation of C1 on C2, probably positional in the absence of a fixed torticollis. Code stroke imaging results were communicated on 06/30/2022 at 5:29 pm to provider Bhagat via secure text paging. Electronically Signed   By: FMargaretha SheffieldM.D.   On: 06/30/2022 17:30      LOS: 1 day    LFlora Lipps MD [facility] Available via Epic secure chat 7am-7pm After these hours, please refer to coverage provider listed on [email or web address] 07/01/2022, 4:00 PM

## 2022-07-02 DIAGNOSIS — D72829 Elevated white blood cell count, unspecified: Secondary | ICD-10-CM | POA: Diagnosis not present

## 2022-07-02 DIAGNOSIS — I10 Essential (primary) hypertension: Secondary | ICD-10-CM | POA: Diagnosis not present

## 2022-07-02 DIAGNOSIS — G9340 Encephalopathy, unspecified: Secondary | ICD-10-CM | POA: Diagnosis not present

## 2022-07-02 DIAGNOSIS — R569 Unspecified convulsions: Secondary | ICD-10-CM | POA: Diagnosis not present

## 2022-07-02 NOTE — TOC Transition Note (Addendum)
Transition of Care [facility]) - CM/SW Discharge Note   Patient Details  Name: Thomas Norman MRN: [id number] Date of Birth: September 21, 1951  Transition [facility] Care [facility] CM/SW Contact:  Carles Collet, RN Phone Number: 07/02/2022, 9:20 AM   Clinical Narrative:    Spoke w patient over the phone. He states that he has a RW at home, no need for additional DME. HE politely declined referral to OP PT. Instructed that he can obtain referral from PCP after DC if he changes his mind. No other TOC needs identified  Update, OT recs for 3/1 will be delivered to house   Final next level of care: Home/Self Care Barriers to Discharge: No Barriers Identified   Patient Goals and CMS Choice        Discharge Placement                       Discharge Plan and Services                                     Social Determinants of Health (SDOH) Interventions     Readmission Risk Interventions     No data to display

## 2022-07-02 NOTE — Progress Notes (Signed)
Physical Therapy Treatment Patient Details Name: Thomas Norman MRN: [id number] DOB: 1951-04-14 Today's Date: 07/02/2022   History of Present Illness 71 y.o. male presents to [facility] hospital on 06/30/2022 with confusion, R gaze preference, and witnessed tonic-clonic seizure activity. PMH includes L parietal [place] 2020, seizures, HTN.    PT Comments    Pt making improved progress functionally but continues to present with cognitive deficits.  Will continue to recommend OPPT for balance and cognition.  Pt refusing DME as well.  Educated on balance activities to perform at home to improve balance.     Recommendations for follow up therapy are one component of a multi-disciplinary discharge planning process, led by the attending physician.  Recommendations may be updated based on patient status, additional functional criteria and insurance authorization.  Follow Up Recommendations  Outpatient PT     Assistance Recommended at Discharge Frequent or constant Supervision/Assistance  Patient can return home with the following A lot of help with walking and/or transfers;A little help with bathing/dressing/bathroom;Assistance with cooking/housework;Direct supervision/assist for medications management;Direct supervision/assist for financial management;Assist for transportation;Help with stairs or ramp for entrance   Equipment Recommendations  Rolling walker (2 wheels);Other (comment) (shower seat)    Recommendations for Other Services       Precautions / Restrictions Precautions Precautions: Fall Precaution Comments: seizure Restrictions Weight Bearing Restrictions: No     Mobility  Bed Mobility Overal bed mobility: Needs Assistance Bed Mobility: Supine to Sit     Supine to sit: Modified independent (Device/Increase time)          Transfers Overall transfer level: Modified independent                      Ambulation/Gait Ambulation/Gait assistance: Supervision Gait Distance  (Feet): 400 Feet Assistive device: None Gait Pattern/deviations: Step-through pattern, Drifts right/left       General Gait Details: Continues with slow gt speed and mild staggering.  No overt LOB and showing improvement from last PT session.   Stairs Stairs: Yes Stairs assistance: Supervision Stair Management: No rails Number of Stairs: 20 General stair comments: Cues for sequencing and safety.  NO LOB and no use of rails.  Slow and controlled speed.   Wheelchair Mobility    Modified Rankin (Stroke Patients Only)       Balance Overall balance assessment: Needs assistance Sitting-balance support: No upper extremity supported, Feet supported Sitting balance-Leahy Scale: Good       Standing balance-Leahy Scale: Fair                 High Level Balance Comments: Educated on performing single limb stance and picking up objects outside BOS to improve balance at d/c as he is refusing f/u OPPT.            Cognition Arousal/Alertness: Awake/alert Behavior During Therapy: WFL for tasks assessed/performed Overall Cognitive Status: Impaired/Different from baseline                                 General Comments: increased time to perform cognitive tasks        Exercises      General Comments General comments (skin integrity, edema, etc.): VSS on RA, family present in room      Pertinent Vitals/Pain Pain Assessment Pain Assessment: No/denies pain    Home Living Family/patient expects to be discharged to:: Private residence Living Arrangements: Spouse/significant other Available Help at Discharge: Family;Available 24  hours/day Type of Home: House Home Access: Stairs to enter Entrance Stairs-Rails: None Entrance Stairs-Number of Steps: 1   Home Layout: One level Home Equipment: Conservation officer, nature (2 wheels)      Prior Function            PT Goals (current goals can now be found in the care plan section) Acute Rehab PT Goals Patient  Stated Goal: to return to baseline Potential to Achieve Goals: Good Progress towards PT goals: Progressing toward goals    Frequency    Min 3X/week      PT Plan Current plan remains appropriate    Co-evaluation              AM-PAC PT "6 Clicks" Mobility   Outcome Measure  Help needed turning from your back to your side while in a flat bed without using bedrails?: A Little Help needed moving from lying on your back to sitting on the side of a flat bed without using bedrails?: A Little Help needed moving to and from a bed to a chair (including a wheelchair)?: A Little Help needed standing up from a chair using your arms (e.g., wheelchair or bedside chair)?: A Little Help needed to walk in hospital room?: A Little Help needed climbing 3-5 steps with a railing? : A Little 6 Click Score: 18    End of Session Equipment Utilized During Treatment: Gait belt Activity Tolerance: Patient tolerated treatment well Patient left: in chair;with family/visitor present Nurse Communication: Mobility status PT Visit Diagnosis: Other abnormalities of gait and mobility (R26.89);Other symptoms and signs involving the nervous system (R29.898)     Time: 6256-3893 PT Time Calculation (min) (ACUTE ONLY): 10 min  Charges:  $Gait Training: 8-22 mins                     Erasmo Leventhal , PTA Acute Rehabilitation Services Office [phone]    Cristela Blue 07/02/2022, 11:16 AM

## 2022-07-02 NOTE — Progress Notes (Signed)
Patient discharge instructions reviewed. IV removed with catheter tip intact. Patient verbalizes understanding of discharge instructions. Off unit via wheelchair to discharge home with self care.

## 2022-07-02 NOTE — Care Management (Signed)
    Durable Medical Equipment  (From admission, onward)           Start     Ordered   07/02/22 1029  For home use only DME Bedside commode  Once       Question:  Patient needs a bedside commode to treat with the following condition  Answer:  Weakness   07/02/22 1028   07/01/22 1621  For home use only DME Walker rolling  Once       Question Answer Comment  Walker: With [place]   Patient needs a walker to treat with the following condition Seizure ([place])      07/01/22 1621

## 2022-07-02 NOTE — Evaluation (Signed)
Occupational Therapy Evaluation Patient Details Name: Thomas Norman MRN: [id number] DOB: June 23, 1951 Today's Date: 07/02/2022   History of Present Illness 71 y.o. male presents to [facility] hospital on 06/30/2022 with confusion, R gaze preference, and witnessed tonic-clonic seizure activity. PMH includes L parietal [place] 2020, seizures, HTN.   Clinical Impression   Pt independent at baseline with ADLs and functional mobility, lives with family who can assist at d/c. Pt currently needing supervision for ADLs and min guard for transfers without AD. Pt with cognitive deficits, especially when dual tasking, needing significantly increased time to recall year, and perform simple cognitive tasks. Educated pt/family on providing assist for med mgmt at home, and use of shower chair and RW for safety. Pt presenting with impairments listed below, will follow acutely. Recommend OP OT at d/c.     Recommendations for follow up therapy are one component of a multi-disciplinary discharge planning process, led by the attending physician.  Recommendations may be updated based on patient status, additional functional criteria and insurance authorization.   Follow Up Recommendations  Outpatient OT    Assistance Recommended at Discharge Intermittent Supervision/Assistance  Patient can return home with the following A little help with walking and/or transfers;A little help with bathing/dressing/bathroom;Direct supervision/assist for medications management;Direct supervision/assist for financial management;Assistance with cooking/housework;Assist for transportation;Help with stairs or ramp for entrance    Functional Status Assessment  Patient has had a recent decline in their functional status and demonstrates the ability to make significant improvements in function in a reasonable and predictable amount of time.  Equipment Recommendations  BSC/3in1 (as shower seat)    Recommendations for Other Services PT consult      Precautions / Restrictions Precautions Precautions: Fall Precaution Comments: seizure Restrictions Weight Bearing Restrictions: No      Mobility Bed Mobility               General bed mobility comments: OOB in chair upon arrival    Transfers Overall transfer level: Needs assistance Equipment used: None Transfers: Sit to/from Stand Sit to Stand: Min guard                  Balance Overall balance assessment: Needs assistance Sitting-balance support: No upper extremity supported, Feet supported Sitting balance-Leahy Scale: Good     Standing balance support: No upper extremity supported, During functional activity Standing balance-Leahy Scale: Fair Standing balance comment: unstable at times with no AD                           ADL either performed or assessed with clinical judgement   ADL Overall ADL's : Needs assistance/impaired Eating/Feeding: Modified independent   Grooming: Supervision/safety   Upper Body Bathing: Supervision/ safety   Lower Body Bathing: Supervison/ safety   Upper Body Dressing : Supervision/safety   Lower Body Dressing: Supervision/safety   Toilet Transfer: Supervision/safety   Toileting- Clothing Manipulation and Hygiene: Supervision/safety   Tub/ Shower Transfer: Supervision/safety   Functional mobility during ADLs: Supervision/safety       Vision Baseline Vision/History: 1 Wears glasses Vision Assessment?: No apparent visual deficits     Perception     Praxis      Pertinent Vitals/Pain Pain Assessment Pain Assessment: No/denies pain     Hand Dominance Right   Extremity/Trunk Assessment Upper Extremity Assessment Upper Extremity Assessment: Overall WFL for tasks assessed   Lower Extremity Assessment Lower Extremity Assessment: Defer to PT evaluation   Cervical / Trunk Assessment Cervical /  Trunk Assessment: Normal   Communication Communication Communication: Expressive difficulties (pt  reporting some word finding difficulties)   Cognition Arousal/Alertness: Awake/alert Behavior During Therapy: WFL for tasks assessed/performed Overall Cognitive Status: Impaired/Different from baseline Area of Impairment: Following commands, Safety/judgement, Awareness, Problem solving                       Following Commands: Follows one step commands consistently, Follows multi-step commands inconsistently Safety/Judgement: Decreased awareness of safety, Decreased awareness of deficits Awareness: Emergent Problem Solving: Difficulty sequencing General Comments: pt needing significantly increased time to recall year, increased time needed to count backwards and state months of the year backwards while walking, increased time for dual-tasking     General Comments  VSS on RA, family present in room    Exercises     Shoulder [place] expects to be discharged to:: Private residence Living Arrangements: Spouse/significant other Available Help at Discharge: Family;Available 24 hours/day Type of Home: House Home Access: Stairs to enter [employer] of Steps: 1 Entrance Stairs-Rails: None Home Layout: One level     Bathroom Shower/Tub: Occupational psychologist: Standard     Home Equipment: Conservation officer, nature (2 wheels)          Prior Functioning/Environment Prior Level of Function : Independent/Modified Independent;Driving             Mobility Comments: retired, independent and active ADLs Comments: ind with ADLs/IADLs        OT Problem List: Decreased strength;Decreased range of motion;Decreased activity tolerance;Impaired balance (sitting and/or standing);Decreased cognition;Decreased safety awareness;Decreased coordination      OT Treatment/Interventions: Self-care/ADL training;Therapeutic exercise;Energy conservation;DME and/or AE instruction;Therapeutic activities;Cognitive  remediation/compensation;Balance training;Patient/family education    OT Goals(Current goals can be found in the care plan section) Acute Rehab OT Goals Patient Stated Goal: none stated OT Goal Formulation: With patient Time For Goal Achievement: 07/16/22 Potential to Achieve Goals: Good ADL Goals Pt Will Perform Tub/Shower Transfer: Shower transfer;ambulating;3 in 1;with modified independence Additional ADL Goal #1: pt will receive passing score on pillbox test in prep for ind with med mgmt Additional ADL Goal #2: pt will perform 4 step trailmaking task in prep for ADLs  OT Frequency: Min 2X/week    Co-evaluation              AM-PAC OT "6 Clicks" Daily Activity     Outcome Measure Help from another person eating meals?: None Help from another person taking care of personal grooming?: A Little Help from another person toileting, which includes using toliet, bedpan, or urinal?: A Little Help from another person bathing (including washing, rinsing, drying)?: A Little Help from another person to put on and taking off regular upper body clothing?: A Little Help from another person to put on and taking off regular lower body clothing?: A Little 6 Click Score: 19   End of Session Nurse Communication: Mobility status  Activity Tolerance: Patient tolerated treatment well Patient left: in chair;with call bell/phone within reach;with family/visitor present  OT Visit Diagnosis: Unsteadiness on feet (R26.81);Other abnormalities of gait and mobility (R26.89);Muscle weakness (generalized) (M62.81)                Time: 6283-6629 OT Time Calculation (min): 15 min Charges:  OT General Charges $OT Visit: 1 Visit OT Evaluation $OT Eval Low Complexity: 1 Low  Lynnda Child, OTD, OTR/L Acute Rehab [phone] - [place] 07/02/2022, 10:22 AM

## 2022-07-02 NOTE — Discharge Summary (Addendum)
Physician Discharge Summary  [facility] MWN:[id number] DOB: Feb 09, 1951 DOA: 06/30/2022  PCP: Gaynelle Arabian, MD  Admit date: 06/30/2022 Discharge date: 07/02/2022  Admitted From: Home  Discharge disposition: Home   Recommendations for Outpatient Follow-Up:   Follow up with your primary care provider in 1 to 2 weeks.   Follow-up with neurologist in 2 to 4 weeks. Check CBC, BMP, magnesium in the next visit   Discharge Diagnosis:   Principal Problem:   Seizures ([place]) Active Problems:   Essential hypertension Resolved:   Acute encephalopathy   Leukocytosis   Seizure ([place])   Discharge Condition: Improved.  Diet recommendation:   Regular.  Wound care: None.  Code status: Full.   History of Present Illness:   [facility] is a 71 y.o. 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  He had acute confusion and trouble finding correct words.  While in the triage, patient had a witnessed tonic-clonic seizure involving all 4 extremities and received Ativan 2 mg IV x1.  He was subsequently somnolent, with diminished responsiveness.  Patient did have an additional witnessed episode of generalized tonic-clonic activity, which resolved following dose of Ativan 4 mg IV x1.  He also received Depakote 1000 mg IV x1.  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  Follows up with [facility].  Does admit to increasing alcohol intake but no recreational drug use.    In the ED, creatinine 1.23, albumin 3.6; otherwise, liver enzymes within normal limits.  CBC notable for will with cell count 10,600.  INR 1.2.  EKG showed sinus rhythm  CT head  showed evidence of encephalomalacia in the left parietal lobe, compatible with sequela of prior  hemorrhage, and also demonstrated evidence of chronic microvascular ischemic disease.  Neurology was consulted and patient was admitted to the [facility] for further evaluation and treatment.   [facility] Course:   Following conditions were addressed during hospitalization as listed below,  Generalized tonic-clonic seizures:  Witnessed seizures in the ED.  Neurology was consulted.  Recommended resumption of home Keppra.  Patient will continue Keppra 500 mg twice daily on discharge.  EEG was performed which was within normal limits.  MRI of the brain showed no acute intracranial abnormality but progression of nonspecific chronic white matter changes.Marland Kitchen  He was advised to follow-up with neurology as outpatient and no driving until seen by neurology or at least 6 months after being started on antiepileptic drugs.   Acute metabolic encephalopathy: Resolved at this time.  Likely postictal state . MRI of the brain without any acute findings.  Urinalysis did not show any evidence of UTI.  COVID and influenza was negative.   Leukocytosis: Resolved at this time.  Likely reactive.  Chest x-ray with mild left basilar atelectasis/infiltrate. Urinalysis without infection.    Essential Hypertension: On amlodipine, metoprolol at home.    Resume on discharge.   Debility, weakness.  Physical therapy has seen the patient and recommend outpatient PT.  Disposition.  At this time, patient is stable for disposition home with outpatient PT.  Medical Consultants:   Neurology  Procedures:    EEG Subjective:   Today, patient was seen and examined at bedside.  Feels better.  Was able to ambulate in the hallway.  Wishes to go home.  Discharge Exam:   Vitals:  07/02/22 0400 07/02/22 0834  BP: 108/67 137/81  Pulse: 70 88  Resp: 18 18  Temp: 97.6 F (36.4 C) 97.8 F (36.6 C)  SpO2: 96% 96%   Vitals:   07/02/22 0000 07/02/22 0400 07/02/22 0500 07/02/22 0834  BP: 124/80 108/67  137/81  Pulse: 87 70  88   Resp: '18 18  18  '$ Temp: 97.9 F (36.6 C) 97.6 F (36.4 C)  97.8 F (36.6 C)  TempSrc: Oral Oral  Oral  SpO2: 95% 96%  96%  Weight:   76.4 kg   Height:       General: Alert awake, not in obvious distress HENT: pupils equally reacting to light,  No scleral pallor or icterus noted. Oral mucosa is moist.  Chest:  Clear breath sounds.   No crackles or wheezes.  CVS: S1 &S2 heard. No murmur.  Regular rate and rhythm. Abdomen: Soft, nontender, nondistended.  Bowel sounds are heard.   Extremities: No cyanosis, clubbing or edema.  Peripheral pulses are palpable. Psych: Alert, awake and oriented, normal mood CNS:  No cranial nerve deficits.  Power equal in all extremities.   Skin: Warm and dry.  No rashes noted.  The results of significant diagnostics from this hospitalization (including imaging, microbiology, ancillary and laboratory) are listed below for reference.     Diagnostic Studies:   MR BRAIN W WO CONTRAST  Result Date: 07/01/2022 CLINICAL DATA:  Seizure disorder, clinical change. EXAM: MRI HEAD WITHOUT AND WITH CONTRAST TECHNIQUE: Multiplanar, multiecho pulse sequences of the brain and surrounding structures were obtained without and with intravenous contrast. CONTRAST:  62m GADAVIST GADOBUTROL 1 MMOL/ML IV SOLN COMPARISON:  Head CT June 30, 2022; MRI of the brain November 12, 2019. FINDINGS: Brain: No acute infarction, hemorrhage, hydrocephalus, extra-axial collection or mass lesion. No interval change of the area of encephalomalacia and gliosis with associated hemosiderin staining in the left parietal region. Remote small right cerebellar infarct. Small to moderate amount of scattered foci of T2 hyperintensity within the white matter of the cerebral hemispheres and within the pons, nonspecific, progressed when compared to prior MRI. Mesial temporal lobes are symmetric and with normal signal characteristics. No focus of abnormal contrast enhancement identified. Vascular: Normal flow  voids. Skull and upper cervical spine: Normal marrow signal. Sinuses/Orbits: Negative. Other: None. IMPRESSION: 1. No acute intracranial abnormality. 2. Progression of nonspecific chronic white matter changes when compared to MRI performed in January 2021, now mild-to-moderate. 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 Electronically Signed   By: KPedro EarlsM.D.   On: 07/01/2022 09:42   EEG adult  Result Date: 07/01/2022 Thomas Havens MD     07/01/2022  8:39 AM Patient Name: WMCCLAIN SHALLMRN: [id number] Attending: PLora HavensReferring Physician/Provider: BLorenza Chick MD Date: 07/01/2022 Duration: 25.34 mins Patient history:  71y.o. 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  EEG to evaluate for seizure. Level of alertness: Awake, asleep AEDs during EEG study: LEV Technical aspects: This EEG study was done with scalp electrodes positioned according to the 10-20 International system of electrode placement. Electrical activity was reviewed with band pass filter of 1-'70Hz'$ , sensitivity of 7 uV/mm, display speed of 371msec with a '60Hz'$  notched filter applied as appropriate. EEG data were recorded continuously and digitally stored.  Video monitoring was available and reviewed as appropriate. Description: The posterior dominant rhythm consists of 8-9 Hz activity of moderate voltage (25-35 uV) seen predominantly  in posterior head regions, symmetric and reactive to eye opening and eye closing. Sleep was characterized by vertex waves, sleep spindles (12 to 14 Hz), maximal frontocentral region.  Hyperventilation and photic stimulation were not performed.   IMPRESSION: This study is within normal limits. No seizures or epileptiform discharges were seen throughout the recording. A normal interictal EEG does not exclude nor support the diagnosis of epilepsy. L[facility]   DG CHEST PORT 1  VIEW  Result Date: 06/30/2022 CLINICAL DATA:  Altered mental status and weakness. EXAM: PORTABLE CHEST 1 VIEW COMPARISON:  July 17, 2019 FINDINGS: The heart size and mediastinal contours are within normal limits. Mildly decreased lung volumes are seen. Mild atelectasis and/or early infiltrate is noted within the left lung base. There is no evidence of a pleural effusion or pneumothorax. The visualized skeletal structures are unremarkable. IMPRESSION: Mild left basilar atelectasis and/or early infiltrate. Electronically Signed   By: Virgina Norfolk M.D.   On: 06/30/2022 22:26   CT HEAD CODE STROKE WO CONTRAST  Result Date: 06/30/2022 CLINICAL DATA:  Code stroke.  Stroke, follow up EXAM: CT HEAD WITHOUT CONTRAST TECHNIQUE: Contiguous axial images were obtained from the base of the skull through the vertex without intravenous contrast. RADIATION DOSE REDUCTION: This exam was performed according to the departmental dose-optimization program which includes automated exposure control, adjustment of the mA and/or kV according to patient size and/or use of iterative reconstruction technique. COMPARISON:  CT head 07/14/2019. FINDINGS: Brain: Encephalomalacia in the left parietal lobe, compatible with the sequela of prior hemorrhage. Additional patchy white matter hypoattenuation, nonspecific but compatible with chronic microvascular ischemic disease. No evidence of acute large vascular territory infarct, acute hemorrhage, mass lesion, midline shift, or hydrocephalus Vascular: No hyperdense vessel identified. Calcific intracranial atherosclerosis. Skull: No acute fracture. Sinuses/Orbits: No acute findings. Other: Partially imaged rotation of C1 on C2. ASPECTS ([place] Stroke Program Early CT Score) Total score (0-10 with 10 being normal): 10. IMPRESSION: 1. No evidence of acute intracranial abnormality.  ASPECTS is 10. 2. Encephalomalacia in the left parietal lobe, compatible with the sequela of prior hemorrhage.  3. Chronic microvascular ischemic disease. 4. Partially imaged rotation of C1 on C2, probably positional in the absence of a fixed torticollis. Code stroke imaging results were communicated on 06/30/2022 at 5:29 pm to provider Bhagat via secure text paging. Electronically Signed   By: Margaretha Sheffield M.D.   On: 06/30/2022 17:30     Labs:   Basic Metabolic Panel: Recent Labs  Lab 06/30/22 1741 06/30/22 1750 06/30/22 1801 07/01/22 0024  NA 139  --  139 139  K 3.7  --  4.0 3.7  CL 108  --  105 106  CO2 16*  --   --  23  GLUCOSE 126*  --  128* 100*  BUN 16  --  16 12  CREATININE 1.23  --  1.20 1.20  CALCIUM 8.3*  --   --  8.7*  MG  --  2.4  --  2.2   GFR Estimated Creatinine Clearance: 61.9 mL/min (by C-G formula based on SCr of 1.2 mg/dL). Liver Function Tests: Recent Labs  Lab 06/30/22 1741 07/01/22 0024  AST 26 30  ALT 17 17  ALKPHOS 74 71  BILITOT 0.8 0.7  PROT 6.1* 6.2*  ALBUMIN 3.6 3.5   No results for input(s): "LIPASE", "AMYLASE" in the last 168 hours. No results for input(s): "AMMONIA" in the last 168 hours. Coagulation profile Recent Labs  Lab 06/30/22 1741  INR  1.2    CBC: Recent Labs  Lab 06/30/22 1741 06/30/22 1801 07/01/22 0024  WBC 10.6*  --  10.5  NEUTROABS 8.6*  --  7.5  HGB 13.4 14.3 13.4  HCT 40.6 42.0 38.3*  MCV 91.4  --  87.0  PLT 200  --  180   Cardiac Enzymes: Recent Labs  Lab 06/30/22 1750 07/01/22 0024  CKTOTAL 202 630*   BNP: Invalid input(s): "POCBNP" CBG: Recent Labs  Lab 06/30/22 1724 06/30/22 2337 07/01/22 0419 07/01/22 1208 07/01/22 1546  GLUCAP 137* 90 90 93 114*   D-Dimer No results for input(s): "DDIMER" in the last 72 hours. Hgb A1c No results for input(s): "HGBA1C" in the last 72 hours. Lipid Profile No results for input(s): "CHOL", "HDL", "LDLCALC", "TRIG", "CHOLHDL", "LDLDIRECT" in the last 72 hours. Thyroid function studies Recent Labs    07/01/22 0024  TSH 1.312   Anemia work up No results  for input(s): "VITAMINB12", "FOLATE", "FERRITIN", "TIBC", "IRON", "RETICCTPCT" in the last 72 hours. Microbiology Recent Results (from the past 240 hour(s))  Resp Panel by RT-PCR (Flu A&B, Covid) Anterior Nasal Swab     Status: None   Collection Time: 06/30/22  4:30 PM   Specimen: Anterior Nasal Swab  Result Value Ref Range Status   SARS Coronavirus 2 by RT PCR NEGATIVE NEGATIVE Final    Comment: (NOTE) SARS-CoV-2 target nucleic acids are NOT DETECTED.  The SARS-CoV-2 RNA is generally detectable in upper respiratory specimens during the acute phase of infection. The lowest concentration of SARS-CoV-2 viral copies this assay can detect is 138 copies/mL. A negative result does not preclude SARS-Cov-2 infection and should not be used as the sole basis for treatment or other patient management decisions. A negative result may occur with  improper specimen collection/handling, submission of specimen other than nasopharyngeal swab, presence of viral mutation(s) within the areas targeted by this assay, and inadequate number of viral copies(<138 copies/mL). A negative result must be combined with clinical observations, patient history, and epidemiological information. The expected result is Negative.  Fact Sheet for Patients:  EntrepreneurPulse.com.au  Fact Sheet for Healthcare Providers:  IncredibleEmployment.be  This test is no t yet approved or cleared by the [place] FDA and  has been authorized for detection and/or diagnosis of SARS-CoV-2 by FDA under an Emergency Use Authorization (EUA). This EUA will remain  in effect (meaning this test can be used) for the duration of the COVID-19 declaration under Section 564(b)(1) of the Act, 21 U.S.C.section 360bbb-3(b)(1), unless the authorization is terminated  or revoked sooner.       Influenza A by PCR NEGATIVE NEGATIVE Final   Influenza B by PCR NEGATIVE NEGATIVE Final    Comment: (NOTE) The  Xpert Xpress SARS-CoV-2/FLU/RSV plus assay is intended as an aid in the diagnosis of influenza from Nasopharyngeal swab specimens and should not be used as a sole basis for treatment. Nasal washings and aspirates are unacceptable for Xpert Xpress SARS-CoV-2/FLU/RSV testing.  Fact Sheet for Patients: EntrepreneurPulse.com.au  Fact Sheet for Healthcare Providers: IncredibleEmployment.be  This test is not yet approved or cleared by the [place] FDA and has been authorized for detection and/or diagnosis of SARS-CoV-2 by FDA under an Emergency Use Authorization (EUA). This EUA will remain in effect (meaning this test can be used) for the duration of the COVID-19 declaration under Section 564(b)(1) of the Act, 21 U.S.C. section 360bbb-3(b)(1), unless the authorization is terminated or revoked.  Performed at [facility], [address] [place],  [place] [address]      Discharge Instructions:   Discharge Instructions     Diet - low sodium heart healthy   Complete by: As directed    Discharge instructions   Complete by: As directed    Follow-up with your neurologist as outpatient in 2 to 4 weeks.  Continue to take seizure medications on discharge.  Seek medical attention for worsening symptoms.  Follow-up with your primary care provider as scheduled by you in 1 to 2 weeks. No driving until 6 months and seen by neurology/primary care provider   Increase activity slowly   Complete by: As directed       Allergies as of 07/02/2022       Reactions   Sulfonamide Derivatives Rash        Medication List     TAKE these medications    amLODipine 2.5 MG tablet Commonly known as: NORVASC Take 1 tablet (2.5 mg total) by mouth daily.   CO Q 10 PO Take 1 tablet by mouth daily.   Fish Oil 1000 MG Caps Take by mouth 3 (three) times a week.   levETIRAcetam 500 MG tablet Commonly known as: KEPPRA Take 1 tablet (500 mg total) by mouth 2  (two) times daily.   metoprolol tartrate 25 MG tablet Commonly known as: LOPRESSOR Take 1 tablet (25 mg total) by mouth 2 (two) times daily.   VITAMIN D3 COMPLETE PO Take 1 tablet by mouth daily.               Durable Medical Equipment  (From admission, onward)           Start     Ordered   07/02/22 1029  For home use only DME Bedside commode  Once       Question:  Patient needs a bedside commode to treat with the following condition  Answer:  Weakness   07/02/22 1028   07/01/22 1621  For home use only DME Walker rolling  Once       Question Answer Comment  Walker: With [place]   Patient needs a walker to treat with the following condition Seizure ([place])      07/01/22 1621              Time coordinating discharge: 39 minutes  Signed:  Maximos Zayas  [facility] 07/02/2022, 11:44 AM

## 2022-07-05 NOTE — Progress Notes (Unsigned)
[facility] [address] [place]. [place] [address] [phone]       STROKE FOLLOW UP NOTE  Thomas Norman Date of Birth:  [birth year] Medical Record Number:  [id number]   Reason for visit: Stroke and seizure follow up    CHIEF COMPLAINT:  Chief Complaint  Patient presents with   Follow-up seizure    Pt reports feeling good. Pt is interested why his seizures are happening. Room 3 with wife.    HPI:  Update 07/06/2022 JM: Patient returns for hospital follow-up regarding recent seizure.  He is accompanied by his wife.  Presented to ED on 9/7 after episode of altered mental status with acute confusion and trouble finding words, while in triage, he had a witnessed tonic-clonic seizure involving all 4 extremities and received Ativan.  Postictally, somnolent with diminished responsiveness. Additional witnessed episode of generalized tonic-clonic activity with repeat Ativan and loaded with Depakote.  Restarted Keppra 500 mg twice daily. EEG no seizure or epileptiform discharges. MR brain no acute abnormality although noted progression of nonspecific chronic white matter changes.  Is been doing well since discharge.  Continues on Keppra 500 mg twice daily.  Denies any recent seizure activity and seems to be tolerating dosage well. Previously had difficulty tolerating Keppra due to increased irritability and generalized slowness sensation which he noted great improvement after previously stopping keppra (back in 01/2022 as no seizures since onset in 06/2019). He does endorse increased stressors going on with family and questions if this could have contributed. He also notes lower blood pressure numbers over the past 2 weeks ranging 100-110s/70s, currently taking amlodipine 2.5 mg daily and metoprolol 25 mg twice daily.  He has not further discussed with PCP.  No further concerns at this time.     History provided for reference purposes only Update 04/12/2022 JM: Patient  returns for stroke and seizure follow-up after prior visit 8 months ago.  He is unaccompanied.  Overall doing well.  Denies new or reoccurring stroke/TIA symptoms.  Denies any seizure activity and has since stopped his keppra back in March. He does feel better overall since stopping.  Continues to maintain ADLs and IADLs independently.  Blood pressure today 118/68. Monitors at home - typically 110-120s/70-80s.  No new concerns at this time.    Update 08/10/2021 JM: Returns for 61-monthstroke and seizure follow-up.  Unaccompanied.  Overall stable.  Denies new or reoccurring stroke/TIA symptoms.  Compliant on Keppra tolerating and no seizure activity. He does report recent lab work by PCP which showed keppra level on lower side of normal and was told dosage may need to be increased. Continues to maintain ADLs and IADLs independently.  Blood pressure today 129/74.  No new concerns at this time.  Update 02/02/2021 JM: Mr. BQuastreturns for 684-monthtroke and seizure follow-up  Stable from stroke standpoint without new or recurring stroke/TIA symptoms Blood pressure today 135/77 on amlodipine 2.5 mg daily and metoprolol 25 mg twice daily. Monitors at home and typically 110-120s/70s-80s.  Remains on Keppra 500 mg twice daily tolerating without side effects without any recent seizure activity Remains active maintaining all ADLs and IADLs independently He does report chronic history of easy bruising more so upper arms   No further concerns at this time  Update 07/22/2020 JM: Thomas Norman for stroke follow-up.  He has been doing well since prior visit without new or reoccurring stroke/TIA symptoms.  He remains on Keppra 500 mg twice daily tolerating well without  seizure activity.  Blood pressure today 131/77.  He does monitor at home and typically 110-120/80s.  Reports recent physical with PCP with lab work all satisfactory.  No concerns at this time.  Update 02/10/2020 JM: Thomas Norman returns for  follow-up regarding left parietal ICH in 06/2019.  He has been doing well since prior visit without residual stroke deficits or new/reoccurring stroke/TIA symptoms.  He does note occasional short-term memory issues prior to his stroke and has noticed improvements of short-term memory after his stroke.  He also endorses prior episodes of transient confusion lasting approximately 30 minutes and then subsiding.  He reports 3 prior episodes with last episode occurring approximately 3 years prior to his stroke.  He had no work-up or evaluation for those episodes previously.  Denies any recurrent episodes or concerns.  Repeat MRI w/wo contrast 11/12/2019 again did not show any abnormalities or significant change from prior MRI.  Continues on Keppra 500 mg twice daily tolerating well without recurrent seizure activity.  Blood pressure today 122/70.  Reports recent lab work by PCP which did show slightly elevated lipid panel and has restarted on omega-3 and plans on repeating lipid panel PCP in the fall.  No concerns at this time.  Update 10/09/2019: Thomas Norman is a 71 year old male who is being Norman today for stroke follow-up.  He has been doing well from a stroke standpoint without residual deficits or reoccurring/new stroke/TIA symptoms.  MRI w/wo contrast was done on 09/07/2019 which did not show any underlying abnormalities for possible cause/etiology of recent hemorrhage.  Also repeated EEG which was normal.  He was evaluated by [facility] neurology for second opinion regarding left parietal ICH etiology.  It was recommended by Dr. Patsey Berthold to undergo catheter angiogram for further evaluation.  Angiogram unremarkable for evidence of AVMs or abnormalities.  He continues to be stable from a neurological standpoint without residual deficits.  He continues on Keppra tolerating well without reoccurring seizure activity.  Continues to monitor blood pressure at home and typically range 100-1 29/70 to 80s.  Patient questions  etiology of hemorrhage which was primarily discussed during visit.  He does endorse after his fall, he immediately returned to trying to pull his trailer using increased exertion and continued increased exertion up until he presented to ED.  He also endorses possible worsening cognition/memory as he uses a calendar to write dates are important information down typically 1 day/week but he noticed after his fall, he was writing something on his calendar daily.  He also reports upon his symptom onset of increased confusion and aphasia, he took 2x 325 mg tablets of aspirin as he believes he was having a stroke.  Stroke admission 07/13/2019: Thomas Norman is a 71 y.o. male with history of HTN and HLD  presented on 07/13/2019 with aphasia and confusion.  Witnessed tonic-clonic seizure activity in ED lobby.    Stroke work-up revealed small acute left parietal ICH as evidenced on CT head and MRI likely related to hypertension.  He did not receive IV t-PA due to [place].  MRI did not show evidence of underlying mass lesion, AVM or CAA but did show mild scattered subarachnoid hemorrhage Norman within the adjacent left frontal-occipital region.  CTA head/neck unremarkable.  LDL 95.  A1c 5.2.  Witnessed tonic-clonic seizure while in ER with EEG showing evidence of epileptogenicity in left frontal area along with severe diffuse encephalopathy likely secondary to sedated state during EEG.  Elevated blood pressure post seizure activity but otherwise stable  during admission.  Loaded with Keppra and recommended continuation at discharge for seizure prophylaxis.  HTN and HLD stable.  Other stroke risk factors include advanced age and EtOH use but no prior history of stroke.  Discharged home in stable condition recommendation of outpatient PT/OT.  Initial visit 08/19/2019 JM: Thomas Norman is a 71 year old male who is being Norman today for stroke follow-up accompanied by his wife.  He has been doing well since discharge with complete  resolution of residual deficits.  He was initially participating in outpatient PT/ST and has since been discharged.  He has not returned back to prior activities as he was not sure of any restrictions and fear of reoccurring stroke or worsening of bleed.  He does endorse roughly 10 days prior to symptom onset, he was attempting to move a loaded trailer but his hand slipped off the jack and fell backwards "with considerable force" hitting the back portion of his head.  He denies loss of consciousness but reports "worst head "I ever remember having".  He did experience pain for approximately 30 minutes but then resolved and did not experience any headache, vision loss, weakness, AMS or speech difficulty until he presented to the ED.  Blood pressure has always been well managed typically ranging 110-120/70s.  Blood pressure today 116/70.  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  He has remained on Keppra 500 mg twice daily without side effects or reoccurring seizure activity.  He does have a history of easy bruising noticeably greater distal extremities upper>lower.  He ensures avoidance of aspirin, aspirin-containing products or ibuprofen products. no further concerns at this time.        ROS:   14 system review of systems performed and negative with exception of see HPI  PMH:  Past Medical History:  Diagnosis Date   Hypertension    Intracranial hemorrhage ([place])    Kidney calculi    Seizures (HCC)     PSH:  Past Surgical History:  Procedure Laterality Date   LITHOTRIPSY     skin cancers     TONSILLECTOMY      Social History:  Social History   Socioeconomic History   Marital status: Married    Spouse name: Not on file   Number of children: Not on file   Years of education: Not on file   Highest education level: Not on file  Occupational History   Not on file  Tobacco Use   Smoking status: Never    Smokeless tobacco: Never  Substance and Sexual Activity   Alcohol use: Yes    Alcohol/week: 1.0 standard drink of alcohol    Types: 1 Glasses of wine per week   Drug use: No   Sexual activity: Yes  Other Topics Concern   Not on file  Social History Narrative   Not on file   Social Determinants of Health   Financial Resource Strain: Not on file  Food Insecurity: Not on file  Transportation Needs: Not on file  Physical Activity: Not on file  Stress: Not on file  Social Connections: Not on file  Intimate Partner Violence: Not on file    Family History:  Family History  Problem Relation Age of Onset   Diabetes Mother    Dementia Mother     Medications:   Current Outpatient Medications on File Prior to Visit  Medication Sig Dispense Refill   amLODipine (NORVASC) 2.5 MG tablet Take  1 tablet (2.5 mg total) by mouth daily. 30 tablet 2   Coenzyme Q10 (CO Q 10 PO) Take 1 tablet by mouth daily.     levETIRAcetam (KEPPRA) 500 MG tablet Take 1 tablet (500 mg total) by mouth 2 (two) times daily. 60 tablet 2   metoprolol tartrate (LOPRESSOR) 25 MG tablet Take 1 tablet (25 mg total) by mouth 2 (two) times daily. 60 tablet 2   Multiple Vitamins-Minerals (VITAMIN D3 COMPLETE PO) Take 1 tablet by mouth daily.     Omega-3 Fatty Acids (FISH OIL) 1000 MG CAPS Take by mouth 3 (three) times a week.     No current facility-administered medications on file prior to visit.    Allergies:   Allergies  Allergen Reactions   Sulfonamide Derivatives Rash     Physical Exam  Vitals:   07/06/22 1014  BP: 121/77  Pulse: 61  Weight: 162 lb (73.5 kg)  Height: '5\' 11"'$  (1.803 m)    Body mass index is 22.59 kg/m. No results found.   General: well developed, well nourished, very pleasant middle-age Caucasian male, seated, in no evident distress Head: head normocephalic and atraumatic.   Neck: supple with no carotid or supraclavicular bruits Cardiovascular: regular rate and rhythm, no  murmurs Musculoskeletal: no deformity Skin:  no rash/petichiae Vascular:  Normal pulses all extremities   Neurologic Exam Mental Status: Awake and fully alert.   Fluent speech and language.  Oriented to place and time. Recent and remote memory intact. Attention span, concentration and fund of knowledge appropriate. Mood and affect appropriate.  Cranial Nerves: Pupils equal, briskly reactive to light. Extraocular movements full without nystagmus. Visual fields full to confrontation. Hearing intact. Facial sensation intact. Face, tongue, palate moves normally and symmetrically.  Motor: Normal bulk and tone. Normal strength in all tested extremity muscles. Sensory.: intact to touch , pinprick , position and vibratory sensation.  Coordination: Rapid alternating movements normal in all extremities. Finger-to-nose and heel-to-shin performed accurately bilaterally. Gait and Station: Arises from chair without difficulty. Stance is normal. Gait demonstrates normal stride length and balance without use of assistive device Reflexes: 1+ and symmetric. Toes downgoing.         ASSESSMENT: ZADE FALKNER is a 71 y.o. year old male presented with aphasia, confusion, agitation and elevated BP (post ictal)  on 07/13/2019 with stroke work-up revealing small acute left parietal ICH and mild scattered subarachnoid hemorrhage left parietal-occipital region with subsequent tonic-clonic seizure. Vascular risk factors include HTN and HLD.  MRI W/WO contrast x2 negative for underlying abnormalities.  He was evaluated by [facility] neurology for second opinion regarding hemorrhage etiology which remained indeterminate.  Breakthrough seizure 06/2022 off keppra and was restarted at that time    PLAN:  Left parietal ICH/parietal-occipital SAH:  Recovered well without residual deficits No indication for antithrombotic therefore advised to avoid aspirin, aspirin-containing products and ibuprofen products.  Continue fish oil  for secondary stroke prevention.  Maintain strict control of hypertension with blood pressure goal below 130/90. Advised to f/u with PCP regarding lower BP readings, may need to make adjustments to BP regimen but will defer to PCP  Seizure, post ICH:  Recent tonic-clonic seizure x2 06/2022, stopped keppra per pt request in 01/2022 as seizure free since onset in 06/2019. As seizure occurred off medication, it will be recommended lifelong use of antiseizure medication at this time Continue Keppra 500 mg twice daily, he will call office if any difficulty tolerating in the future as previously experiencing irritability and generalized  slowness but denies this being an issue currently Advised no driving for 6 months post seizure activity per [place] law Advised to call office with any seizure activity and avoidance of seizure provoking triggers which was fully discussed during visit     Follow up in 5 months or call earlier if needed   CC:  Gaynelle Arabian, MD    I spent 43 minutes of face-to-face and non-face-to-face time with patient and wife.  This included previsit chart review including review of recent hospitalization, lab review, study review, order entry, electronic health record documentation, patient and wife education regarding and discussion regarding above diagnoses and treatment plan and answered all the questions to patient wife satisfaction  Frann Rider, AGNP-BC  [facility] [address] [place], [place] [address]  Phone [phone] Fax [phone] Note: This document was prepared with digital dictation and possible smart phrase technology. Any transcriptional errors that result from this process are unintentional.

## 2022-07-06 ENCOUNTER — Encounter: Payer: Self-pay | Admitting: Adult Health

## 2022-07-06 ENCOUNTER — Ambulatory Visit (INDEPENDENT_AMBULATORY_CARE_PROVIDER_SITE_OTHER): Payer: Medicare Other | Admitting: Adult Health

## 2022-07-06 VITALS — BP 121/77 | HR 61 | Ht 71.0 in | Wt 162.0 lb

## 2022-07-06 DIAGNOSIS — I69398 Other sequelae of cerebral infarction: Secondary | ICD-10-CM | POA: Diagnosis not present

## 2022-07-06 DIAGNOSIS — R569 Unspecified convulsions: Secondary | ICD-10-CM

## 2022-07-06 DIAGNOSIS — I639 Cerebral infarction, unspecified: Secondary | ICD-10-CM

## 2022-07-06 DIAGNOSIS — I609 Nontraumatic subarachnoid hemorrhage, unspecified: Secondary | ICD-10-CM | POA: Diagnosis not present

## 2022-07-06 DIAGNOSIS — Z09 Encounter for follow-up examination after completed treatment for conditions other than malignant neoplasm: Secondary | ICD-10-CM

## 2022-07-06 MED ORDER — LEVETIRACETAM 500 MG PO TABS: 500.0000 mg | ORAL_TABLET | Freq: Two times a day (BID) | ORAL | 3 refills | Status: DC

## 2022-07-06 NOTE — Patient Instructions (Addendum)
Continue Keppra 500 mg twice daily for seizure prevention - it will be recommended that you maintain on an antiseizure medication lifelong at this time as  you did have a seizure while off therapy. No driving for 6 months post seizure per [place] law  Continue to follow up with PCP regarding blood pressure management  Maintain strict control of hypertension with blood pressure goal below 130/90  Signs of a Stroke? Follow the BEFAST method:  Balance Watch for a sudden loss of balance, trouble with coordination or vertigo Eyes Is there a sudden loss of vision in one or both eyes? Or double vision?  Face: Ask the person to smile. Does one side of the face droop or is it numb?  Arms: Ask the person to raise both arms. Does one arm drift downward? Is there weakness or numbness of a leg? Speech: Ask the person to repeat a simple phrase. Does the speech sound slurred/strange? Is the person confused ? Time: If you observe any of these signs, call 911.   Get help right away if: You have any of these problems: A seizure that lasts longer than 5 minutes. Many seizures in a row and you do not feel better between seizures. A seizure that makes it harder to breathe. A seizure and you can no longer speak or use part of your body. You do not wake up right after a seizure. You get hurt during a seizure. You feel confused or have pain right after a seizure.      Followup in the future with me in 5 months or call earlier if needed      Thank you for coming to see [place] at [facility]. I hope we have been able to provide you high quality care today.  You may receive a patient satisfaction survey over the next few weeks. We would appreciate your feedback and comments so that we may continue to improve ourselves and the health of our patients.

## 2022-07-16 NOTE — Progress Notes (Signed)
I agree with the above plan 

## 2022-07-21 DIAGNOSIS — E7800 Pure hypercholesterolemia, unspecified: Secondary | ICD-10-CM | POA: Diagnosis not present

## 2022-07-21 DIAGNOSIS — Z0000 Encounter for general adult medical examination without abnormal findings: Secondary | ICD-10-CM | POA: Diagnosis not present

## 2022-07-21 DIAGNOSIS — Z8679 Personal history of other diseases of the circulatory system: Secondary | ICD-10-CM | POA: Diagnosis not present

## 2022-07-21 DIAGNOSIS — I10 Essential (primary) hypertension: Secondary | ICD-10-CM | POA: Diagnosis not present

## 2022-07-21 DIAGNOSIS — I69398 Other sequelae of cerebral infarction: Secondary | ICD-10-CM | POA: Diagnosis not present

## 2022-07-21 DIAGNOSIS — R569 Unspecified convulsions: Secondary | ICD-10-CM | POA: Diagnosis not present

## 2022-07-21 DIAGNOSIS — Z1331 Encounter for screening for depression: Secondary | ICD-10-CM | POA: Diagnosis not present

## 2022-07-26 ENCOUNTER — Encounter: Payer: Self-pay | Admitting: Adult Health

## 2022-12-12 NOTE — Progress Notes (Unsigned)
[facility] [address] [place]. [place] [id number] [phone]       STROKE FOLLOW UP NOTE  Thomas Norman Date of Birth:  January 24, 1951 Medical Record Number:  [id number]   Reason for visit: Stroke and seizure follow up    CHIEF COMPLAINT:  No chief complaint on file.   HPI:  Update 12/13/2022 JM: Patient returns for 49-monthstroke and seizure follow-up.  Stable since prior visit without any reoccurring seizure activity or any new stroke/TIA symptoms.  Compliant on Keppra 500 mg twice daily, denies side effects.  Blood pressure well-controlled.  Continues to closely follow with PCP for aggressive stroke risk factor management.        History provided for reference purposes only Update 07/06/2022 JM: Patient returns for hospital follow-up regarding recent seizure.  He is accompanied by his wife.  Presented to ED on 9/7 after episode of altered mental status with acute confusion and trouble finding words, while in triage, he had a witnessed tonic-clonic seizure involving all 4 extremities and received Ativan.  Postictally, somnolent with diminished responsiveness. Additional witnessed episode of generalized tonic-clonic activity with repeat Ativan and loaded with Depakote.  Restarted Keppra 500 mg twice daily. EEG no seizure or epileptiform discharges. MR brain no acute abnormality although noted progression of nonspecific chronic white matter changes.  Is been doing well since discharge.  Continues on Keppra 500 mg twice daily.  Denies any recent seizure activity and seems to be tolerating dosage well. Previously had difficulty tolerating Keppra due to increased irritability and generalized slowness sensation which he noted great improvement after previously stopping keppra (back in 01/2022 as no seizures since onset in 06/2019). He does endorse increased stressors going on with family and questions if this could have contributed. He also notes lower blood pressure  numbers over the past 2 weeks ranging 100-110s/70s, currently taking amlodipine 2.5 mg daily and metoprolol 25 mg twice daily.  He has not further discussed with PCP.  No further concerns at this time.  Update 04/12/2022 JM: Patient returns for stroke and seizure follow-up after prior visit 8 months ago.  He is unaccompanied.  Overall doing well.  Denies new or reoccurring stroke/TIA symptoms.  Denies any seizure activity and has since stopped his keppra back in March. He does feel better overall since stopping.  Continues to maintain ADLs and IADLs independently.  Blood pressure today 118/68. Monitors at home - typically 110-120s/70-80s.  No new concerns at this time.    Update 08/10/2021 JM: Returns for 652-monthtroke and seizure follow-up.  Unaccompanied.  Overall stable.  Denies new or reoccurring stroke/TIA symptoms.  Compliant on Keppra tolerating and no seizure activity. He does report recent lab work by PCP which showed keppra level on lower side of normal and was told dosage may need to be increased. Continues to maintain ADLs and IADLs independently.  Blood pressure today 129/74.  No new concerns at this time.  Update 02/02/2021 JM: Thomas Norman for 6-4-monthroke and seizure follow-up  Stable from stroke standpoint without new or recurring stroke/TIA symptoms Blood pressure today 135/77 on amlodipine 2.5 mg daily and metoprolol 25 mg twice daily. Monitors at home and typically 110-120s/70s-80s.  Remains on Keppra 500 mg twice daily tolerating without side effects without any recent seizure activity Remains active maintaining all ADLs and IADLs independently He does report chronic history of easy bruising more so upper arms   No further concerns at this time  Update 07/22/2020 JM: Thomas Norman  is being seen for stroke follow-up.  He has been doing well since prior visit without new or reoccurring stroke/TIA symptoms.  He remains on Keppra 500 mg twice daily tolerating well without  seizure activity.  Blood pressure today 131/77.  He does monitor at home and typically 110-120/80s.  Reports recent physical with PCP with lab work all satisfactory.  No concerns at this time.  Update 02/10/2020 JM: Thomas Norman returns for follow-up regarding left parietal ICH in 06/2019.  He has been doing well since prior visit without residual stroke deficits or new/reoccurring stroke/TIA symptoms.  He does note occasional short-term memory issues prior to his stroke and has noticed improvements of short-term memory after his stroke.  He also endorses prior episodes of transient confusion lasting approximately 30 minutes and then subsiding.  He reports 3 prior episodes with last episode occurring approximately 3 years prior to his stroke.  He had no work-up or evaluation for those episodes previously.  Denies any recurrent episodes or concerns.  Repeat MRI w/wo contrast 11/12/2019 again did not show any abnormalities or significant change from prior MRI.  Continues on Keppra 500 mg twice daily tolerating well without recurrent seizure activity.  Blood pressure today 122/70.  Reports recent lab work by PCP which did show slightly elevated lipid panel and has restarted on omega-3 and plans on repeating lipid panel PCP in the fall.  No concerns at this time.  Update 10/09/2019: Thomas Norman is a 72 year old male who is being seen today for stroke follow-up.  He has been doing well from a stroke standpoint without residual deficits or reoccurring/new stroke/TIA symptoms.  MRI w/wo contrast was done on 09/07/2019 which did not show any underlying abnormalities for possible cause/etiology of recent hemorrhage.  Also repeated EEG which was normal.  He was evaluated by [facility] neurology for second opinion regarding left parietal ICH etiology.  It was recommended by Dr. Patsey Berthold to undergo catheter angiogram for further evaluation.  Angiogram unremarkable for evidence of AVMs or abnormalities.  He continues to be stable from  a neurological standpoint without residual deficits.  He continues on Keppra tolerating well without reoccurring seizure activity.  Continues to monitor blood pressure at home and typically range 100-1 29/70 to 80s.  Patient questions etiology of hemorrhage which was primarily discussed during visit.  He does endorse after his fall, he immediately returned to trying to pull his trailer using increased exertion and continued increased exertion up until he presented to ED.  He also endorses possible worsening cognition/memory as he uses a calendar to write dates are important information down typically 1 day/week but he noticed after his fall, he was writing something on his calendar daily.  He also reports upon his symptom onset of increased confusion and aphasia, he took 2x 325 mg tablets of aspirin as he believes he was having a stroke.  Stroke admission 07/13/2019: Thomas Norman is a 71 y.o. male with history of HTN and HLD  presented on 07/13/2019 with aphasia and confusion.  Witnessed tonic-clonic seizure activity in ED lobby.    Stroke work-up revealed small acute left parietal ICH as evidenced on CT head and MRI likely related to hypertension.  He did not receive IV t-PA due to [place].  MRI did not show evidence of underlying mass lesion, AVM or CAA but did show mild scattered subarachnoid hemorrhage seen within the adjacent left frontal-occipital region.  CTA head/neck unremarkable.  LDL 95.  A1c 5.2.  Witnessed tonic-clonic seizure while in  ER with EEG showing evidence of epileptogenicity in left frontal area along with severe diffuse encephalopathy likely secondary to sedated state during EEG.  Elevated blood pressure post seizure activity but otherwise stable during admission.  Loaded with Keppra and recommended continuation at discharge for seizure prophylaxis.  HTN and HLD stable.  Other stroke risk factors include advanced age and EtOH use but no prior history of stroke.  Discharged home in stable  condition recommendation of outpatient PT/OT.  Initial visit 08/19/2019 JM: Thomas Norman is a 72 year old male who is being seen today for stroke follow-up accompanied by his wife.  He has been doing well since discharge with complete resolution of residual deficits.  He was initially participating in outpatient PT/ST and has since been discharged.  He has not returned back to prior activities as he was not sure of any restrictions and fear of reoccurring stroke or worsening of bleed.  He does endorse roughly 10 days prior to symptom onset, he was attempting to move a loaded trailer but his hand slipped off the jack and fell backwards "with considerable force" hitting the back portion of his head.  He denies loss of consciousness but reports "worst head "I ever remember having".  He did experience pain for approximately 30 minutes but then resolved and did not experience any headache, vision loss, weakness, AMS or speech difficulty until he presented to the ED.  Blood pressure has always been well managed typically ranging 110-120/70s.  Blood pressure today 116/70.  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  He has remained on Keppra 500 mg twice daily without side effects or reoccurring seizure activity.  He does have a history of easy bruising noticeably greater distal extremities upper>lower.  He ensures avoidance of aspirin, aspirin-containing products or ibuprofen products. no further concerns at this time.        ROS:   14 system review of systems performed and negative with exception of see HPI  PMH:  Past Medical History:  Diagnosis Date   Hypertension    Intracranial hemorrhage ([place])    Kidney calculi    Seizures (HCC)     PSH:  Past Surgical History:  Procedure Laterality Date   LITHOTRIPSY     skin cancers     TONSILLECTOMY      Social History:  Social History   Socioeconomic History   Marital  status: Married    Spouse name: Not on file   Number of children: Not on file   Years of education: Not on file   Highest education level: Not on file  Occupational History   Not on file  Tobacco Use   Smoking status: Never   Smokeless tobacco: Never  Substance and Sexual Activity   Alcohol use: Yes    Alcohol/week: 1.0 standard drink of alcohol    Types: 1 Glasses of wine per week   Drug use: No   Sexual activity: Yes  Other Topics Concern   Not on file  Social History Narrative   Not on file   Social Determinants of Health   Financial Resource Strain: Not on file  Food Insecurity: Not on file  Transportation Needs: Not on file  Physical Activity: Not on file  Stress: Not on file  Social Connections: Not on file  Intimate Partner Violence: Not on file    Family History:  Family History  Problem Relation Age of Onset   Diabetes Mother  Dementia Mother     Medications:   Current Outpatient Medications on File Prior to Visit  Medication Sig Dispense Refill   amLODipine (NORVASC) 2.5 MG tablet Take 1 tablet (2.5 mg total) by mouth daily. 30 tablet 2   Coenzyme Q10 (CO Q 10 PO) Take 1 tablet by mouth daily.     levETIRAcetam (KEPPRA) 500 MG tablet Take 1 tablet (500 mg total) by mouth 2 (two) times daily. 180 tablet 3   metoprolol tartrate (LOPRESSOR) 25 MG tablet Take 1 tablet (25 mg total) by mouth 2 (two) times daily. 60 tablet 2   Multiple Vitamins-Minerals (VITAMIN D3 COMPLETE PO) Take 1 tablet by mouth daily.     Omega-3 Fatty Acids (FISH OIL) 1000 MG CAPS Take by mouth 3 (three) times a week.     No current facility-administered medications on file prior to visit.    Allergies:   Allergies  Allergen Reactions   Sulfonamide Derivatives Rash     Physical Exam  There were no vitals filed for this visit.   There is no height or weight on file to calculate BMI. No results found.   General: well developed, well nourished, very pleasant middle-age  Caucasian male, seated, in no evident distress Head: head normocephalic and atraumatic.   Neck: supple with no carotid or supraclavicular bruits Cardiovascular: regular rate and rhythm, no murmurs Musculoskeletal: no deformity Skin:  no rash/petichiae Vascular:  Normal pulses all extremities   Neurologic Exam Mental Status: Awake and fully alert.   Fluent speech and language.  Oriented to place and time. Recent and remote memory intact. Attention span, concentration and fund of knowledge appropriate. Mood and affect appropriate.  Cranial Nerves: Pupils equal, briskly reactive to light. Extraocular movements full without nystagmus. Visual fields full to confrontation. Hearing intact. Facial sensation intact. Face, tongue, palate moves normally and symmetrically.  Motor: Normal bulk and tone. Normal strength in all tested extremity muscles. Sensory.: intact to touch , pinprick , position and vibratory sensation.  Coordination: Rapid alternating movements normal in all extremities. Finger-to-nose and heel-to-shin performed accurately bilaterally. Gait and Station: Arises from chair without difficulty. Stance is normal. Gait demonstrates normal stride length and balance without use of assistive device Reflexes: 1+ and symmetric. Toes downgoing.         ASSESSMENT: Thomas Norman is a 72 y.o. year old male presented with aphasia, confusion, agitation and elevated BP (post ictal)  on 07/13/2019 with stroke work-up revealing small acute left parietal ICH and mild scattered subarachnoid hemorrhage left parietal-occipital region with subsequent tonic-clonic seizure. Vascular risk factors include HTN and HLD.  MRI W/WO contrast x2 negative for underlying abnormalities.  He was evaluated by [facility] neurology for second opinion regarding hemorrhage etiology which remained indeterminate.  Breakthrough seizure 06/2022 off keppra and was restarted at that time    PLAN:  Left parietal ICH/parietal-occipital  SAH:  Recovered well without residual deficits No indication for antithrombotic therefore advised to avoid aspirin, aspirin-containing products and ibuprofen products.  Continue fish oil for secondary stroke prevention.  Maintain strict control of hypertension with blood pressure goal below 130/90. Advised to f/u with PCP regarding lower BP readings, may need to make adjustments to BP regimen but will defer to PCP  Seizure, post ICH:  Recent tonic-clonic seizure x2 06/2022, stopped keppra per pt request in 01/2022 as seizure free since onset in 06/2019. As seizure occurred off medication, it will be recommended lifelong use of antiseizure medication at this time Continue Keppra 500 mg  twice daily, he will call office if any difficulty tolerating in the future as previously experiencing irritability and generalized slowness but denies this being an issue currently Advised no driving for 6 months post seizure activity per [place] law Advised to call office with any seizure activity and avoidance of seizure provoking triggers which was fully discussed during visit     Follow up in 5 months or call earlier if needed   CC:  Gaynelle Arabian, MD    I spent 43 minutes of face-to-face and non-face-to-face time with patient and wife.  This included previsit chart review including review of recent hospitalization, lab review, study review, order entry, electronic health record documentation, patient and wife education regarding and discussion regarding above diagnoses and treatment plan and answered all the questions to patient wife satisfaction  Frann Rider, AGNP-BC  [facility] [address] [place], [place] [address]  Phone [phone] Fax [phone] Note: This document was prepared with digital dictation and possible smart phrase technology. Any transcriptional errors that result from this process are unintentional.

## 2022-12-13 ENCOUNTER — Ambulatory Visit (INDEPENDENT_AMBULATORY_CARE_PROVIDER_SITE_OTHER): Payer: Medicare Other | Admitting: Adult Health

## 2022-12-13 ENCOUNTER — Encounter: Payer: Self-pay | Admitting: Adult Health

## 2022-12-13 VITALS — BP 128/73 | HR 55 | Ht 71.0 in | Wt 169.0 lb

## 2022-12-13 DIAGNOSIS — I639 Cerebral infarction, unspecified: Secondary | ICD-10-CM

## 2022-12-13 DIAGNOSIS — R569 Unspecified convulsions: Secondary | ICD-10-CM | POA: Diagnosis not present

## 2022-12-13 DIAGNOSIS — I609 Nontraumatic subarachnoid hemorrhage, unspecified: Secondary | ICD-10-CM

## 2022-12-13 MED ORDER — LEVETIRACETAM 500 MG PO TABS: 500.0000 mg | ORAL_TABLET | Freq: Two times a day (BID) | ORAL | 3 refills | Status: DC

## 2022-12-13 NOTE — Patient Instructions (Addendum)
Continue Keppra 500 mg twice daily for seizure prevention  Continue to follow up with PCP regarding blood pressure and cholesterol management  Maintain strict control of hypertension with blood pressure goal below 130/90 and cholesterol with LDL cholesterol (bad cholesterol) goal below 70 mg/dL.   Signs of a Stroke? Follow the BEFAST method:  Balance Watch for a sudden loss of balance, trouble with coordination or vertigo Eyes Is there a sudden loss of vision in one or both eyes? Or double vision?  Face: Ask the person to smile. Does one side of the face droop or is it numb?  Arms: Ask the person to raise both arms. Does one arm drift downward? Is there weakness or numbness of a leg? Speech: Ask the person to repeat a simple phrase. Does the speech sound slurred/strange? Is the person confused ? Time: If you observe any of these signs, call 911.     Followup in the future with me in 1 year or call earlier if needed      Thank you for coming to see [place] at [facility]. I hope we have been able to provide you high quality care today.  You may receive a patient satisfaction survey over the next few weeks. We would appreciate your feedback and comments so that we may continue to improve ourselves and the health of our patients.

## 2022-12-19 DIAGNOSIS — L720 Epidermal cyst: Secondary | ICD-10-CM | POA: Diagnosis not present

## 2022-12-19 DIAGNOSIS — L821 Other seborrheic keratosis: Secondary | ICD-10-CM | POA: Diagnosis not present

## 2022-12-19 DIAGNOSIS — L905 Scar conditions and fibrosis of skin: Secondary | ICD-10-CM | POA: Diagnosis not present

## 2022-12-19 DIAGNOSIS — D225 Melanocytic nevi of trunk: Secondary | ICD-10-CM | POA: Diagnosis not present

## 2022-12-19 DIAGNOSIS — Z85828 Personal history of other malignant neoplasm of skin: Secondary | ICD-10-CM | POA: Diagnosis not present

## 2022-12-19 DIAGNOSIS — L820 Inflamed seborrheic keratosis: Secondary | ICD-10-CM | POA: Diagnosis not present

## 2022-12-19 DIAGNOSIS — L570 Actinic keratosis: Secondary | ICD-10-CM | POA: Diagnosis not present

## 2022-12-19 DIAGNOSIS — D224 Melanocytic nevi of scalp and neck: Secondary | ICD-10-CM | POA: Diagnosis not present

## 2022-12-19 DIAGNOSIS — Z85820 Personal history of malignant melanoma of skin: Secondary | ICD-10-CM | POA: Diagnosis not present

## 2022-12-19 DIAGNOSIS — D692 Other nonthrombocytopenic purpura: Secondary | ICD-10-CM | POA: Diagnosis not present

## 2022-12-19 DIAGNOSIS — D1801 Hemangioma of skin and subcutaneous tissue: Secondary | ICD-10-CM | POA: Diagnosis not present

## 2023-02-14 DIAGNOSIS — H35373 Puckering of macula, bilateral: Secondary | ICD-10-CM | POA: Diagnosis not present

## 2023-02-14 DIAGNOSIS — Z961 Presence of intraocular lens: Secondary | ICD-10-CM | POA: Diagnosis not present

## 2023-02-14 DIAGNOSIS — H02403 Unspecified ptosis of bilateral eyelids: Secondary | ICD-10-CM | POA: Diagnosis not present

## 2023-04-20 ENCOUNTER — Ambulatory Visit: Payer: Medicare Other | Admitting: Adult Health

## 2023-05-02 ENCOUNTER — Ambulatory Visit: Payer: Medicare Other | Admitting: Adult Health

## 2023-05-09 DIAGNOSIS — H9313 Tinnitus, bilateral: Secondary | ICD-10-CM | POA: Diagnosis not present

## 2023-05-09 DIAGNOSIS — H90A21 Sensorineural hearing loss, unilateral, right ear, with restricted hearing on the contralateral side: Secondary | ICD-10-CM | POA: Diagnosis not present

## 2023-05-09 DIAGNOSIS — H903 Sensorineural hearing loss, bilateral: Secondary | ICD-10-CM | POA: Diagnosis not present

## 2023-07-04 DIAGNOSIS — L111 Transient acantholytic dermatosis [Grover]: Secondary | ICD-10-CM | POA: Diagnosis not present

## 2023-07-04 DIAGNOSIS — Z85828 Personal history of other malignant neoplasm of skin: Secondary | ICD-10-CM | POA: Diagnosis not present

## 2023-08-30 DIAGNOSIS — H6121 Impacted cerumen, right ear: Secondary | ICD-10-CM | POA: Diagnosis not present

## 2023-08-30 DIAGNOSIS — Z85828 Personal history of other malignant neoplasm of skin: Secondary | ICD-10-CM | POA: Diagnosis not present

## 2023-08-30 DIAGNOSIS — E7800 Pure hypercholesterolemia, unspecified: Secondary | ICD-10-CM | POA: Diagnosis not present

## 2023-08-30 DIAGNOSIS — Z23 Encounter for immunization: Secondary | ICD-10-CM | POA: Diagnosis not present

## 2023-08-30 DIAGNOSIS — Z8679 Personal history of other diseases of the circulatory system: Secondary | ICD-10-CM | POA: Diagnosis not present

## 2023-08-30 DIAGNOSIS — R569 Unspecified convulsions: Secondary | ICD-10-CM | POA: Diagnosis not present

## 2023-08-30 DIAGNOSIS — Z125 Encounter for screening for malignant neoplasm of prostate: Secondary | ICD-10-CM | POA: Diagnosis not present

## 2023-08-30 DIAGNOSIS — Z1211 Encounter for screening for malignant neoplasm of colon: Secondary | ICD-10-CM | POA: Diagnosis not present

## 2023-08-30 DIAGNOSIS — I69398 Other sequelae of cerebral infarction: Secondary | ICD-10-CM | POA: Diagnosis not present

## 2023-08-30 DIAGNOSIS — I10 Essential (primary) hypertension: Secondary | ICD-10-CM | POA: Diagnosis not present

## 2023-08-30 DIAGNOSIS — Z0000 Encounter for general adult medical examination without abnormal findings: Secondary | ICD-10-CM | POA: Diagnosis not present

## 2023-09-05 DIAGNOSIS — Z1211 Encounter for screening for malignant neoplasm of colon: Secondary | ICD-10-CM | POA: Diagnosis not present

## 2023-12-14 ENCOUNTER — Encounter: Payer: Self-pay | Admitting: Adult Health

## 2023-12-14 ENCOUNTER — Ambulatory Visit: Payer: Medicare Other | Admitting: Adult Health

## 2023-12-14 VITALS — BP 122/70 | HR 63 | Ht 70.0 in | Wt 172.0 lb

## 2023-12-14 DIAGNOSIS — I609 Nontraumatic subarachnoid hemorrhage, unspecified: Secondary | ICD-10-CM

## 2023-12-14 DIAGNOSIS — R569 Unspecified convulsions: Secondary | ICD-10-CM | POA: Diagnosis not present

## 2023-12-14 DIAGNOSIS — I639 Cerebral infarction, unspecified: Secondary | ICD-10-CM

## 2023-12-14 MED ORDER — LEVETIRACETAM 500 MG PO TABS: 500.0000 mg | ORAL_TABLET | Freq: Two times a day (BID) | ORAL | 3 refills | Status: DC

## 2023-12-14 NOTE — Patient Instructions (Addendum)
 Your Plan:  Continue keppra 500mg  twice daily for seizure prevention  Please call if interested in changing medications to Briviact which is similar to keppra but typically is not associated with mood change side effects   Continue to follow with your PCP for stroke risk factor management      Follow up in 1 year or call earlier if needed     Thank you for coming to see [place] at [facility]. I hope we have been able to provide you high quality care today.  You may receive a patient satisfaction survey over the next few weeks. We would appreciate your feedback and comments so that we may continue to improve ourselves and the health of our patients.

## 2023-12-14 NOTE — Progress Notes (Signed)
 [facility] [address] [place]. [place] [address] [phone]       STROKE FOLLOW UP NOTE  [name] Date of Birth:  Jul 20, 1951 Medical Record Number:  [id number]   Reason for visit: Stroke and seizure follow up    CHIEF COMPLAINT:  Chief Complaint  Patient presents with   Follow-up    Rm8, wife present, OZ:HYQM sz 07/13/22 doing well wife wants to more more about sz med side effects and efficacy.  STROKE:pt stated stroke occurred in 2020 but doing very good but wife reported mild hearing loss would like to know if related to his stroke.     HPI:  Update 12/14/2023 JM: Patient is being seen for yearly stroke and seizure follow-up accompanied by his wife.  He has been doing well since prior visit.  Denies any additional seizure activity, reports compliance on Keppra. Wife mentions some increased irritability and mood swings but only on occasion, husband believes more from being home all the time now where previously he was working.  Denies any new stroke/TIA symptoms. He was seen by audiology and ENT and found to have mild hearing loss. He does report longstanding exposure to loud noise which is more likely the culprit especially as hearing loss was noted bilaterally.  Blood pressure well-managed, occasionally monitors at home and typically 110s-120s.  Routinely follows with PCP for stroke risk factor management.    History provided for reference purposes only Update 12/13/2022 JM: Patient returns for 97-month stroke and seizure follow-up accompanied by his wife.  Stable since prior visit without any reoccurring seizure activity or any new stroke/TIA symptoms.  Compliant on Keppra 500 mg twice daily, denies any significant side effects.  Blood pressure well-controlled.  Continues to closely follow with PCP for aggressive stroke risk factor management.  No questions or concerns at this time.  Update 07/06/2022 JM: Patient returns for hospital follow-up  regarding recent seizure.  He is accompanied by his wife.  Presented to ED on 9/7 after episode of altered mental status with acute confusion and trouble finding words, while in triage, he had a witnessed tonic-clonic seizure involving all 4 extremities and received Ativan.  Postictally, somnolent with diminished responsiveness. Additional witnessed episode of generalized tonic-clonic activity with repeat Ativan and loaded with Depakote.  Restarted Keppra 500 mg twice daily. EEG no seizure or epileptiform discharges. MR brain no acute abnormality although noted progression of nonspecific chronic white matter changes.  Is been doing well since discharge.  Continues on Keppra 500 mg twice daily.  Denies any recent seizure activity and seems to be tolerating dosage well. Previously had difficulty tolerating Keppra due to increased irritability and generalized slowness sensation which he noted great improvement after previously stopping keppra (back in 01/2022 as no seizures since onset in 06/2019). He does endorse increased stressors going on with family and questions if this could have contributed. He also notes lower blood pressure numbers over the past 2 weeks ranging 100-110s/70s, currently taking amlodipine 2.5 mg daily and metoprolol 25 mg twice daily.  He has not further discussed with PCP.  No further concerns at this time.  Update 04/12/2022 JM: Patient returns for stroke and seizure follow-up after prior visit 8 months ago.  He is unaccompanied.  Overall doing well.  Denies new or reoccurring stroke/TIA symptoms.  Denies any seizure activity and has since stopped his keppra back in March. He does feel better overall since stopping.  Continues to maintain ADLs and IADLs independently.  Blood  pressure today 118/68. Monitors at home - typically 110-120s/70-80s.  No new concerns at this time.    Update 08/10/2021 JM: Returns for 19-month stroke and seizure follow-up.  Unaccompanied.  Overall stable.  Denies new  or reoccurring stroke/TIA symptoms.  Compliant on Keppra tolerating and no seizure activity. He does report recent lab work by PCP which showed keppra level on lower side of normal and was told dosage may need to be increased. Continues to maintain ADLs and IADLs independently.  Blood pressure today 129/74.  No new concerns at this time.  Update 02/02/2021 JM: Thomas Norman returns for 71-month stroke and seizure follow-up  Stable from stroke standpoint without new or recurring stroke/TIA symptoms Blood pressure today 135/77 on amlodipine 2.5 mg daily and metoprolol 25 mg twice daily. Monitors at home and typically 110-120s/70s-80s.  Remains on Keppra 500 mg twice daily tolerating without side effects without any recent seizure activity Remains active maintaining all ADLs and IADLs independently He does report chronic history of easy bruising more so upper arms   No further concerns at this time  Update 07/22/2020 JM: Thomas Norman is being seen for stroke follow-up.  He has been doing well since prior visit without new or reoccurring stroke/TIA symptoms.  He remains on Keppra 500 mg twice daily tolerating well without seizure activity.  Blood pressure today 131/77.  He does monitor at home and typically 110-120/80s.  Reports recent physical with PCP with lab work all satisfactory.  No concerns at this time.  Update 02/10/2020 JM: Thomas Norman returns for follow-up regarding left parietal ICH in 06/2019.  He has been doing well since prior visit without residual stroke deficits or new/reoccurring stroke/TIA symptoms.  He does note occasional short-term memory issues prior to his stroke and has noticed improvements of short-term memory after his stroke.  He also endorses prior episodes of transient confusion lasting approximately 30 minutes and then subsiding.  He reports 3 prior episodes with last episode occurring approximately 3 years prior to his stroke.  He had no work-up or evaluation for those episodes  previously.  Denies any recurrent episodes or concerns.  Repeat MRI w/wo contrast 11/12/2019 again did not show any abnormalities or significant change from prior MRI.  Continues on Keppra 500 mg twice daily tolerating well without recurrent seizure activity.  Blood pressure today 122/70.  Reports recent lab work by PCP which did show slightly elevated lipid panel and has restarted on omega-3 and plans on repeating lipid panel PCP in the fall.  No concerns at this time.  Update 10/09/2019: Thomas Norman is a 73 year old male who is being seen today for stroke follow-up.  He has been doing well from a stroke standpoint without residual deficits or reoccurring/new stroke/TIA symptoms.  MRI w/wo contrast was done on 09/07/2019 which did not show any underlying abnormalities for possible cause/etiology of recent hemorrhage.  Also repeated EEG which was normal.  He was evaluated by [facility] neurology for second opinion regarding left parietal ICH etiology.  It was recommended by Dr. Jayme Cloud to undergo catheter angiogram for further evaluation.  Angiogram unremarkable for evidence of AVMs or abnormalities.  He continues to be stable from a neurological standpoint without residual deficits.  He continues on Keppra tolerating well without reoccurring seizure activity.  Continues to monitor blood pressure at home and typically range 100-1 29/70 to 80s.  Patient questions etiology of hemorrhage which was primarily discussed during visit.  He does endorse after his fall, he immediately returned to trying to  pull his trailer using increased exertion and continued increased exertion up until he presented to ED.  He also endorses possible worsening cognition/memory as he uses a calendar to write dates are important information down typically 1 day/week but he noticed after his fall, he was writing something on his calendar daily.  He also reports upon his symptom onset of increased confusion and aphasia, he took 2x 325 mg tablets of  aspirin as he believes he was having a stroke.  Stroke admission 07/13/2019: [name] is a 72 y.o. male with history of HTN and HLD  presented on 07/13/2019 with aphasia and confusion.  Witnessed tonic-clonic seizure activity in ED lobby.    Stroke work-up revealed small acute left parietal ICH as evidenced on CT head and MRI likely related to hypertension.  He did not receive IV t-PA due to ICH.  MRI did not show evidence of underlying mass lesion, AVM or CAA but did show mild scattered subarachnoid hemorrhage seen within the adjacent left frontal-occipital region.  CTA head/neck unremarkable.  LDL 95.  A1c 5.2.  Witnessed tonic-clonic seizure while in ER with EEG showing evidence of epileptogenicity in left frontal area along with severe diffuse encephalopathy likely secondary to sedated state during EEG.  Elevated blood pressure post seizure activity but otherwise stable during admission.  Loaded with Keppra and recommended continuation at discharge for seizure prophylaxis.  HTN and HLD stable.  Other stroke risk factors include advanced age and EtOH use but no prior history of stroke.  Discharged home in stable condition recommendation of outpatient PT/OT.  Initial visit 08/19/2019 JM: Thomas Norman is a 73 year old male who is being seen today for stroke follow-up accompanied by his wife.  He has been doing well since discharge with complete resolution of residual deficits.  He was initially participating in outpatient PT/ST and has since been discharged.  He has not returned back to prior activities as he was not sure of any restrictions and fear of reoccurring stroke or worsening of bleed.  He does endorse roughly 10 days prior to symptom onset, he was attempting to move a loaded trailer but his hand slipped off the jack and fell backwards "with considerable force" hitting the back portion of his head.  He denies loss of consciousness but reports "worst head "I ever remember having".  He did  experience pain for approximately 30 minutes but then resolved and did not experience any headache, vision loss, weakness, AMS or speech difficulty until he presented to the ED.  Blood pressure has always been well managed typically ranging 110-120/70s.  Blood pressure today 116/70.  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  He has remained on Keppra 500 mg twice daily without side effects or reoccurring seizure activity.  He does have a history of easy bruising noticeably greater distal extremities upper>lower.  He ensures avoidance of aspirin, aspirin-containing products or ibuprofen products. no further concerns at this time.        ROS:   14 system review of systems performed and negative with exception of see HPI  PMH:  Past Medical History:  Diagnosis Date   Hypertension    Intracranial hemorrhage (HCC)    Kidney calculi    Seizures (HCC)     PSH:  Past Surgical History:  Procedure Laterality Date   LITHOTRIPSY     skin cancers     TONSILLECTOMY      Social  History:  Social History   Socioeconomic History   Marital status: Married    Spouse name: Not on file   Number of children: Not on file   Years of education: Not on file   Highest education level: Not on file  Occupational History   Not on file  Tobacco Use   Smoking status: Never   Smokeless tobacco: Never  Substance and Sexual Activity   Alcohol use: Yes    Alcohol/week: 1.0 standard drink of alcohol    Types: 1 Glasses of wine per week   Drug use: No   Sexual activity: Yes  Other Topics Concern   Not on file  Social History Narrative   Not on file   Social Drivers of Health   Financial Resource Strain: Not on file  Food Insecurity: Not on file  Transportation Needs: Not on file  Physical Activity: Not on file  Stress: Not on file  Social Connections: Not on file  Intimate Partner Violence: Not on file    Family  History:  Family History  Problem Relation Age of Onset   Diabetes Mother    Dementia Mother     Medications:   Current Outpatient Medications on File Prior to Visit  Medication Sig Dispense Refill   amLODipine (NORVASC) 2.5 MG tablet Take 1 tablet (2.5 mg total) by mouth daily. 30 tablet 2   Coenzyme Q10 (CO Q 10 PO) Take 1 tablet by mouth daily.     levETIRAcetam (KEPPRA) 500 MG tablet Take 1 tablet (500 mg total) by mouth 2 (two) times daily. 180 tablet 3   metoprolol tartrate (LOPRESSOR) 25 MG tablet Take 1 tablet (25 mg total) by mouth 2 (two) times daily. 60 tablet 2   Multiple Vitamins-Minerals (VITAMIN D3 COMPLETE PO) Take 1 tablet by mouth daily.     Omega-3 Fatty Acids (FISH OIL) 1000 MG CAPS Take by mouth 3 (three) times a week.     No current facility-administered medications on file prior to visit.    Allergies:   Allergies  Allergen Reactions   Ace Inhibitors Other (See Comments)   Sulfonamide Derivatives Rash     Physical Exam  Vitals:   12/14/23 1417  BP: 122/70  Pulse: 63  Weight: 172 lb (78 kg)  Height: 5\' 10"  (1.778 m)   Body mass index is 24.68 kg/m. No results found.  General: well developed, well nourished, very pleasant middle-age Caucasian male, seated, in no evident distress Head: head normocephalic and atraumatic.   Neck: supple with no carotid or supraclavicular bruits Cardiovascular: regular rate and rhythm, no murmurs Musculoskeletal: no deformity Skin:  no rash/petichiae Vascular:  Normal pulses all extremities   Neurologic Exam Mental Status: Awake and fully alert.   Fluent speech and language.  Oriented to place and time. Recent and remote memory intact. Attention span, concentration and fund of knowledge appropriate. Mood and affect appropriate.  Cranial Nerves: Pupils equal, briskly reactive to light. Extraocular movements full without nystagmus. Visual fields full to confrontation. Hearing intact during visit, mild loss  bilaterally. Facial sensation intact. Face, tongue, palate moves normally and symmetrically.  Motor: Normal bulk and tone. Normal strength in all tested extremity muscles. Sensory.: intact to touch , pinprick , position and vibratory sensation.  Coordination: Rapid alternating movements normal in all extremities. Finger-to-nose and heel-to-shin performed accurately bilaterally. Gait and Station: Arises from chair without difficulty. Stance is normal. Gait demonstrates normal stride length and balance without use of assistive device Reflexes: 1+ and symmetric.  Toes downgoing.         ASSESSMENT: Thomas Norman is a 73 y.o. year old male with L parietal ICH and scattered subarachnoid hemorrhage left parietal-occipital region with subsequent tonic-clonic seizure in 06/2019 without residual deficits. Vascular risk factors include HTN and HLD.  MRI W/WO contrast x2 negative for underlying abnormalities.  He was evaluated by [facility] neurology for second opinion regarding hemorrhage etiology which remained indeterminate.  Breakthrough seizure 06/2022 off keppra and was restarted at that time     PLAN:  Left parietal ICH/parietal-occipital SAH:  Recovered well without residual deficits No indication for antithrombotic therefore advised to avoid aspirin, aspirin-containing products and ibuprofen products.  Continue fish oil for secondary stroke prevention.  Maintain strict control of hypertension with blood pressure goal below 130/90  Seizure, post ICH:  No additional seizure activity Continue Keppra 500 mg twice daily -refill provided. Does report some irritability and mood swings, discussed possibly switching to Briviact which can come with less mood side effects but declines interest at this time. Last tonic-clonic seizure x2 06/2022 off Keppra  Previously, stopped keppra per pt request in 01/2022 as seizure free since onset in 06/2019. As seizure occurred off medication, it will be recommended  lifelong use of antiseizure medication at this time Advised to call office with any seizure activity and avoidance of seizure provoking triggers     Follow up in 1 year or call earlier if needed   CC:  Blair Heys, MD (Inactive)    I spent 30 minutes of face-to-face and non-face-to-face time with patient and wife.  This included previsit chart review including review of recent hospitalization, lab review, study review, order entry, electronic health record documentation, patient and wife education and discussion regarding above diagnoses and treatment plan and answered all other questions to patient and wife satisfaction  Ihor Austin, AGNP-BC  [facility] [address] [place], [place] [address]  Phone [phone] Fax [phone] Note: This document was prepared with digital dictation and possible smart phrase technology. Any transcriptional errors that result from this process are unintentional.

## 2023-12-20 DIAGNOSIS — L570 Actinic keratosis: Secondary | ICD-10-CM | POA: Diagnosis not present

## 2023-12-20 DIAGNOSIS — D225 Melanocytic nevi of trunk: Secondary | ICD-10-CM | POA: Diagnosis not present

## 2023-12-20 DIAGNOSIS — Z85828 Personal history of other malignant neoplasm of skin: Secondary | ICD-10-CM | POA: Diagnosis not present

## 2023-12-20 DIAGNOSIS — L111 Transient acantholytic dermatosis [Grover]: Secondary | ICD-10-CM | POA: Diagnosis not present

## 2023-12-20 DIAGNOSIS — C44622 Squamous cell carcinoma of skin of right upper limb, including shoulder: Secondary | ICD-10-CM | POA: Diagnosis not present

## 2023-12-20 DIAGNOSIS — D485 Neoplasm of uncertain behavior of skin: Secondary | ICD-10-CM | POA: Diagnosis not present

## 2024-03-13 DIAGNOSIS — H33302 Unspecified retinal break, left eye: Secondary | ICD-10-CM | POA: Diagnosis not present

## 2024-03-13 DIAGNOSIS — Z961 Presence of intraocular lens: Secondary | ICD-10-CM | POA: Diagnosis not present

## 2024-03-13 DIAGNOSIS — H35373 Puckering of macula, bilateral: Secondary | ICD-10-CM | POA: Diagnosis not present

## 2024-07-28 ENCOUNTER — Encounter: Payer: Self-pay | Admitting: Adult Health

## 2024-08-09 DIAGNOSIS — R4189 Other symptoms and signs involving cognitive functions and awareness: Secondary | ICD-10-CM | POA: Diagnosis not present

## 2024-08-09 DIAGNOSIS — L989 Disorder of the skin and subcutaneous tissue, unspecified: Secondary | ICD-10-CM | POA: Diagnosis not present

## 2024-08-10 LAB — LAB REPORT - SCANNED
EGFR: 69
TSH: 2.42 (ref 0.41–5.90)

## 2024-08-13 ENCOUNTER — Telehealth: Payer: Self-pay | Admitting: Adult Health

## 2024-08-13 NOTE — Telephone Encounter (Signed)
 Spoke to patient wanted Jessica,NP to review his recent labs taken at PCP Pt states some  sporadic memory loss .Per Harlene ,NP have PCP fax over lab work and will review and give recommendation. Pt aware Pt will call PCP and have them fax over lab work . Gave Pt fax # to our office

## 2024-08-13 NOTE — Telephone Encounter (Signed)
 Pt called needing to speak to a nurse regarding results from a test his PCP ordered. Pt is wanting to know if he should schedule an appt to go over these results. Pt was informed that he would need to discuss with PCP but pt was insistent that he wants his neurologist to go over the results with him. Please advise.

## 2024-08-16 ENCOUNTER — Telehealth: Payer: Self-pay | Admitting: Adult Health

## 2024-08-16 ENCOUNTER — Telehealth: Payer: Self-pay

## 2024-08-16 ENCOUNTER — Emergency Department (HOSPITAL_COMMUNITY)

## 2024-08-16 ENCOUNTER — Observation Stay (HOSPITAL_COMMUNITY)
Admission: EM | Admit: 2024-08-16 | Discharge: 2024-08-18 | Disposition: A | Discharge status: Home / Self Care | Attending: Emergency Medicine | Admitting: Emergency Medicine

## 2024-08-16 ENCOUNTER — Encounter (HOSPITAL_COMMUNITY): Payer: Self-pay | Admitting: Internal Medicine

## 2024-08-16 ENCOUNTER — Other Ambulatory Visit: Payer: Self-pay

## 2024-08-16 DIAGNOSIS — R29898 Other symptoms and signs involving the musculoskeletal system: Secondary | ICD-10-CM | POA: Insufficient documentation

## 2024-08-16 DIAGNOSIS — G40409 Other generalized epilepsy and epileptic syndromes, not intractable, without status epilepticus: Secondary | ICD-10-CM | POA: Diagnosis not present

## 2024-08-16 DIAGNOSIS — Z79899 Other long term (current) drug therapy: Secondary | ICD-10-CM | POA: Insufficient documentation

## 2024-08-16 DIAGNOSIS — R4182 Altered mental status, unspecified: Secondary | ICD-10-CM | POA: Diagnosis present

## 2024-08-16 DIAGNOSIS — S0633AA Contusion and laceration of cerebrum, unspecified, with loss of consciousness status unknown, initial encounter: Secondary | ICD-10-CM | POA: Diagnosis present

## 2024-08-16 DIAGNOSIS — E785 Hyperlipidemia, unspecified: Secondary | ICD-10-CM | POA: Diagnosis not present

## 2024-08-16 DIAGNOSIS — I10 Essential (primary) hypertension: Secondary | ICD-10-CM | POA: Diagnosis present

## 2024-08-16 DIAGNOSIS — Z8673 Personal history of transient ischemic attack (TIA), and cerebral infarction without residual deficits: Secondary | ICD-10-CM

## 2024-08-16 DIAGNOSIS — G936 Cerebral edema: Secondary | ICD-10-CM | POA: Diagnosis not present

## 2024-08-16 DIAGNOSIS — I611 Nontraumatic intracerebral hemorrhage in hemisphere, cortical: Secondary | ICD-10-CM

## 2024-08-16 DIAGNOSIS — R413 Other amnesia: Secondary | ICD-10-CM | POA: Diagnosis not present

## 2024-08-16 DIAGNOSIS — R569 Unspecified convulsions: Secondary | ICD-10-CM | POA: Diagnosis not present

## 2024-08-16 DIAGNOSIS — I619 Nontraumatic intracerebral hemorrhage, unspecified: Principal | ICD-10-CM | POA: Insufficient documentation

## 2024-08-16 DIAGNOSIS — I639 Cerebral infarction, unspecified: Secondary | ICD-10-CM | POA: Diagnosis not present

## 2024-08-16 DIAGNOSIS — Z8679 Personal history of other diseases of the circulatory system: Secondary | ICD-10-CM

## 2024-08-16 DIAGNOSIS — S06330A Contusion and laceration of cerebrum, unspecified, without loss of consciousness, initial encounter: Secondary | ICD-10-CM | POA: Diagnosis not present

## 2024-08-16 DIAGNOSIS — I618 Other nontraumatic intracerebral hemorrhage: Secondary | ICD-10-CM | POA: Diagnosis not present

## 2024-08-16 DIAGNOSIS — R29700 NIHSS score 0: Secondary | ICD-10-CM

## 2024-08-16 DIAGNOSIS — F10920 Alcohol use, unspecified with intoxication, uncomplicated: Secondary | ICD-10-CM | POA: Diagnosis not present

## 2024-08-16 DIAGNOSIS — G9389 Other specified disorders of brain: Secondary | ICD-10-CM | POA: Diagnosis not present

## 2024-08-16 DIAGNOSIS — I6782 Cerebral ischemia: Secondary | ICD-10-CM | POA: Diagnosis not present

## 2024-08-16 HISTORY — DX: Acute respiratory failure, unspecified whether with hypoxia or hypercapnia: J96.00

## 2024-08-16 LAB — I-STAT CHEM 8, ED
BUN: 15 mg/dL (ref 8–23)
Calcium, Ion: 1.12 mmol/L — ABNORMAL LOW (ref 1.15–1.40)
Chloride: 102 mmol/L (ref 98–111)
Creatinine, Ser: 1.3 mg/dL — ABNORMAL HIGH (ref 0.61–1.24)
Glucose, Bld: 79 mg/dL (ref 70–99)
HCT: 43 % (ref 39.0–52.0)
Hemoglobin: 14.6 g/dL (ref 13.0–17.0)
Potassium: 4.4 mmol/L (ref 3.5–5.1)
Sodium: 137 mmol/L (ref 135–145)
TCO2: 23 mmol/L (ref 22–32)

## 2024-08-16 LAB — CBC
HCT: 44.5 % (ref 39.0–52.0)
Hemoglobin: 14.8 g/dL (ref 13.0–17.0)
MCH: 29.8 pg (ref 26.0–34.0)
MCHC: 33.3 g/dL (ref 30.0–36.0)
MCV: 89.7 fL (ref 80.0–100.0)
Platelets: 189 K/uL (ref 150–400)
RBC: 4.96 MIL/uL (ref 4.22–5.81)
RDW: 11.9 % (ref 11.5–15.5)
WBC: 5.4 K/uL (ref 4.0–10.5)
nRBC: 0 % (ref 0.0–0.2)

## 2024-08-16 LAB — COMPREHENSIVE METABOLIC PANEL WITH GFR
ALT: 16 U/L (ref 0–44)
AST: 22 U/L (ref 15–41)
Albumin: 4 g/dL (ref 3.5–5.0)
Alkaline Phosphatase: 64 U/L (ref 38–126)
Anion gap: 13 (ref 5–15)
BUN: 14 mg/dL (ref 8–23)
CO2: 23 mmol/L (ref 22–32)
Calcium: 9 mg/dL (ref 8.9–10.3)
Chloride: 101 mmol/L (ref 98–111)
Creatinine, Ser: 1.27 mg/dL — ABNORMAL HIGH (ref 0.61–1.24)
GFR, Estimated: >60 mL/min (ref >60)
Glucose, Bld: 80 mg/dL (ref 70–99)
Potassium: 4.3 mmol/L (ref 3.5–5.1)
Sodium: 137 mmol/L (ref 135–145)
Total Bilirubin: 0.6 mg/dL (ref 0.0–1.2)
Total Protein: 7.1 g/dL (ref 6.5–8.1)

## 2024-08-16 LAB — ETHANOL: Alcohol, Ethyl (B): <15 mg/dL (ref <15)

## 2024-08-16 LAB — DIFFERENTIAL
Abs Immature Granulocytes: 0.02 K/uL (ref 0.00–0.07)
Basophils Absolute: 0 K/uL (ref 0.0–0.1)
Basophils Relative: 1 %
Eosinophils Absolute: 0 K/uL (ref 0.0–0.5)
Eosinophils Relative: 1 %
Immature Granulocytes: 0 %
Lymphocytes Relative: 7 %
Lymphs Abs: 0.4 K/uL — ABNORMAL LOW (ref 0.7–4.0)
Monocytes Absolute: 0.7 K/uL (ref 0.1–1.0)
Monocytes Relative: 13 %
Neutro Abs: 4.3 K/uL (ref 1.7–7.7)
Neutrophils Relative %: 78 %

## 2024-08-16 LAB — CBG MONITORING, ED
Glucose-Capillary: 67 mg/dL — ABNORMAL LOW (ref 70–99)
Glucose-Capillary: 124 mg/dL — ABNORMAL HIGH (ref 70–99)
Glucose-Capillary: 81 mg/dL (ref 70–99)

## 2024-08-16 LAB — PROTIME-INR
INR: 1.1 (ref 0.8–1.2)
Prothrombin Time: 15 s (ref 11.4–15.2)

## 2024-08-16 LAB — APTT: aPTT: 31 s (ref 24–36)

## 2024-08-16 LAB — HEMOGLOBIN A1C
Hgb A1c MFr Bld: 5.1 % (ref 4.8–5.6)
Mean Plasma Glucose: 99.67 mg/dL

## 2024-08-16 LAB — MRSA NEXT GEN BY PCR, NASAL: MRSA by PCR Next Gen: NOT DETECTED

## 2024-08-16 MED ORDER — SODIUM CHLORIDE 0.9% FLUSH
3.0000 mL | Freq: Once | INTRAVENOUS | Status: AC
  Administered 2024-08-16: 3 mL via INTRAVENOUS

## 2024-08-16 MED ORDER — ACETAMINOPHEN 650 MG RE SUPP: 650.0000 mg | Freq: Four times a day (QID) | RECTAL | Status: DC | PRN

## 2024-08-16 MED ORDER — SODIUM CHLORIDE 0.9% FLUSH
3.0000 mL | Freq: Two times a day (BID) | INTRAVENOUS | Status: DC
Start: 2024-08-16 — End: 2024-08-18
  Administered 2024-08-16 – 2024-08-18 (×4): 3 mL via INTRAVENOUS

## 2024-08-16 MED ORDER — LEVETIRACETAM 250 MG PO TABS
500.0000 mg | ORAL_TABLET | Freq: Two times a day (BID) | ORAL | Status: DC
  Administered 2024-08-16 – 2024-08-18 (×4): 500 mg via ORAL
  Filled 2024-08-16 (×4): qty 2

## 2024-08-16 MED ORDER — GADOBUTROL 1 MMOL/ML IV SOLN
7.0000 mL | Freq: Once | INTRAVENOUS | Status: AC | PRN
  Administered 2024-08-16: 7 mL via INTRAVENOUS

## 2024-08-16 MED ORDER — AMLODIPINE BESYLATE 5 MG PO TABS
2.5000 mg | ORAL_TABLET | Freq: Every day | ORAL | Status: DC
  Administered 2024-08-17 – 2024-08-18 (×2): 2.5 mg via ORAL
  Filled 2024-08-16 (×2): qty 1

## 2024-08-16 MED ORDER — METOPROLOL TARTRATE 25 MG PO TABS
25.0000 mg | ORAL_TABLET | Freq: Two times a day (BID) | ORAL | Status: DC
  Administered 2024-08-16 – 2024-08-18 (×4): 25 mg via ORAL
  Filled 2024-08-16 (×4): qty 1

## 2024-08-16 MED ORDER — ACETAMINOPHEN 325 MG PO TABS
650.0000 mg | ORAL_TABLET | Freq: Four times a day (QID) | ORAL | Status: DC | PRN
  Administered 2024-08-17: 650 mg via ORAL
  Filled 2024-08-16: qty 2

## 2024-08-16 MED ORDER — POLYETHYLENE GLYCOL 3350 17 G PO PACK: 17.0000 g | PACK | Freq: Every day | ORAL | Status: DC | PRN

## 2024-08-16 NOTE — Telephone Encounter (Signed)
 Pt wife called stating that Pt was having Stroke like sympthoms  and wasn't sure what to do  , Informed  CMA  , Cma  took call

## 2024-08-16 NOTE — Telephone Encounter (Signed)
 Addressed, see other TE 08/16/24

## 2024-08-16 NOTE — Telephone Encounter (Signed)
 Spoke with patient's wife and she states patient is having increased memory and behavioral issues. She also states he is asking the same questions over and over. She states patient has been increasingly getting worse. She also reports he is asking to be seen by someone because he is scared he has had another stroke. She states in my gut I know something is wrong, and was asking about taking patient to the ED. I advised if she feels strongly something is not right with the patient and his behavior she can certainly take him to the ED for evaluation. She asked about having an MRI done, I advised as an outpatient order this would require getting approval from their insurance company. However if the ED provider thinks he needs one they would make sure it is done while he is there. She voiced understanding to all. She will take patient to ED for evaluation.   FYI - thank you!

## 2024-08-16 NOTE — ED Triage Notes (Addendum)
 Pt. Stated, Thomas Norman been confused ;a lot for a few days ( started Oct. 4). Im a numbers person and for the last couple of days its been worse. Im not able to put numbers together like telling time and getting the numbers correct. Im a retired IT trainer.

## 2024-08-16 NOTE — ED Provider Triage Note (Signed)
 Emergency Medicine Provider Triage Evaluation Note  Thomas Norman , a 73 y.o. male  was evaluated in triage.  Pt complains of confusion for several weeks. Difficulty with math/calendar/adding/appointments/time worsened over the last several days. Placing objects in different locations. No new meds Hx of seizures with hemorrhagic stroke in 2020 Off keppra  had another seizure>> now remains on it  Confusion seems more mathematical now  Prior confusion in language  Recent blood work (last Friday) normal   Review of Systems  Positive: confusion Negative: HA, dizziness  Physical Exam  BP 110/72 (BP Location: Right Arm)   Pulse 80   Temp 98.5 F (36.9 C)   Resp 19   Ht 5' 10 (1.778 m)   Wt 73 kg   SpO2 99%   BMI 23.10 kg/m  Gen:   Awake, no distress   Resp:  Normal effort  MSK:   Moves extremities without difficulty  Other:  A&O x 4, normal speech, normal ambulation  Medical Decision Making  Medically screening exam initiated at 9:43 AM.  Appropriate orders placed.  Thomas Norman was informed that the remainder of the evaluation will be completed by another provider, this initial triage assessment does not replace that evaluation, and the importance of remaining in the ED until their evaluation is complete.     Thomas Salas, DO 08/16/24 [phone]

## 2024-08-16 NOTE — Consult Note (Signed)
 NEUROLOGY CONSULT NOTE   Date of service: August 16, 2024 Patient Name: Thomas Norman MRN:  [id number] DOB:  [birth year] Chief Complaint: difficulty with calculatiosn Requesting Provider: Ginger Lonni PARAS, *  History of Present Illness  Thomas Norman is a 73 y.o. male with hx of HTN, HLD, ICH, Seizures (Keppra ) who presented to ED d/t gradual worsening confusion and short-term memory for the past few weeks.   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 Neurosurgery consulted, no intervention at this time. Neurology consulted for stroke workup.   On exam, patient is alert and oriented, no aphasia or dysarthria, no focal or sensory deficits. He does have some mild short-term memory issues (1/3 word recall) and is unable to do any calculations. Wife at bedside able to provide details on previous ICH and other medical history.    NIHSS components Score: Comment  1a Level of Conscious 0[]  1[]  2[]  3[]      1b LOC Questions 0[]  1[]  2[]       1c LOC Commands 0[]  1[]  2[]       2 Best Gaze 0[]  1[]  2[]       3 Visual 0[]  1[]  2[]  3[]      4 Facial Palsy 0[]  1[]  2[]  3[]      5a Motor Arm - left 0[]  1[]  2[]  3[]  4[]  UN[]    5b Motor Arm - Right 0[]  1[]  2[]  3[]  4[]  UN[]    6a Motor Leg - Left 0[]  1[]  2[]  3[]  4[]  UN[]    6b Motor Leg - Right 0[]  1[]  2[]  3[]  4[]  UN[]    7 Limb Ataxia 0[]  1[]  2[]  UN[]      8 Sensory 0[]  1[]  2[]  UN[]      9 Best Language 0[]  1[]  2[]  3[]      10 Dysarthria 0[]  1[]  2[]  UN[]      11 Extinct. and Inattention 0[]  1[]  2[]       TOTAL: 0      ROS  Comprehensive ROS performed and pertinent positives documented in HPI   Past History   Past Medical History:  Diagnosis Date   Hypertension    Intracranial hemorrhage (HCC)    Kidney calculi    Seizures (HCC)     Past Surgical History:  Procedure Laterality Date   LITHOTRIPSY     skin cancers     TONSILLECTOMY      Family History: Family History  Problem Relation Age of Onset   Diabetes Mother    Dementia  Mother     Social History  reports that he has never smoked. He has never used smokeless tobacco. He reports current alcohol use of about 1.0 standard drink of alcohol per week. He reports that he does not use drugs.  Allergies  Allergen Reactions   Ace Inhibitors Other (See Comments)   Sulfonamide Derivatives Rash    Medications  No current facility-administered medications for this encounter.  Current Outpatient Medications:    amLODipine  (NORVASC ) 2.5 MG tablet, Take 1 tablet (2.5 mg total) by mouth daily., Disp: 30 tablet, Rfl: 2   Coenzyme Q10 (CO Q 10 PO), Take 1 tablet by mouth daily., Disp: , Rfl:    levETIRAcetam  (KEPPRA ) 500 MG tablet, Take 1 tablet (500 mg total) by mouth 2 (two) times daily., Disp: 180 tablet, Rfl: 3   metoprolol  tartrate (LOPRESSOR ) 25 MG tablet, Take 1 tablet (25 mg total) by mouth 2 (two) times daily., Disp: 60 tablet, Rfl: 2   Multiple Vitamins-Minerals (VITAMIN D3 COMPLETE PO), Take 1 tablet by mouth daily., Disp: ,  Rfl:    Omega-3 Fatty Acids (FISH OIL) 1000 MG CAPS, Take by mouth 3 (three) times a week., Disp: , Rfl:   Vitals   Vitals:   Sep 07, 2024 0900 09/07/24 0924 September 07, 2024 1024 2024-09-07 1236  BP: 110/72  108/64 120/78  Pulse: 80  65 78  Resp: 19  (!) 25 (!) 24  Temp: 98.5 F (36.9 C)  99.1 F (37.3 C) 100.1 F (37.8 C)  TempSrc:   Oral Oral  SpO2: 99%  99% 100%  Weight:  73 kg    Height:  5' 10 (1.778 m)      Body mass index is 23.1 kg/m.   Physical Exam   Constitutional: Appears well-developed and well-nourished.  Cardiovascular: Normal rate and regular rhythm.  Respiratory: Effort normal, non-labored breathing.   Neurologic Examination   Neuro: Mental Status: Patient is awake, alert, oriented to person, place, month, year, and situation. No signs of aphasia or neglect. Decreased short-term memory, 1/3 word recall.  Unable to provide a complete history.  Cranial Nerves: II: Visual Fields are full. Pupils are equal,  round, and reactive to light.   III,IV, VI: EOMI without ptosis or diploplia.  V: Facial sensation is symmetric to temperature VII: Facial movement is symmetric.  VIII: hearing is intact to voice X: Uvula elevates symmetrically. No dysarthria. XI: Shoulder shrug is symmetric. XII: tongue is midline without atrophy or fasciculations.  Motor: Tone is normal. Bulk is normal. 5/5 strength was present in all four extremities.  Sensory: Sensation is symmetric to light touch and temperature in the arms and legs. Cerebellar: FNF and HKS are intact bilaterally   Labs/Imaging/Neurodiagnostic studies   CBC:  Recent Labs  Lab 09-07-24 0935 09/07/24 0947  WBC 5.4  --   NEUTROABS 4.3  --   HGB 14.8 14.6  HCT 44.5 43.0  MCV 89.7  --   PLT 189  --    Basic Metabolic Panel:  Lab Results  Component Value Date   NA 137 09/07/2024   K 4.4 Sep 07, 2024   CO2 23 September 07, 2024   GLUCOSE 79 09/07/2024   BUN 15 09/07/24   CREATININE 1.30 (H) 2024-09-07   CALCIUM 9.0 2024-09-07   GFRNONAA >60 2024/09/07   GFRAA >60 07/16/2019   Lipid Panel:  Lab Results  Component Value Date   LDLCALC 95 06/11/2007   HgbA1c:  Lab Results  Component Value Date   HGBA1C 5.2 07/14/2019   Urine Drug Screen:     Component Value Date/Time   LABOPIA NONE DETECTED 07/14/2019 1155   COCAINSCRNUR NONE DETECTED 07/14/2019 1155   LABBENZ NONE DETECTED 07/14/2019 1155   AMPHETMU NONE DETECTED 07/14/2019 1155   THCU NONE DETECTED 07/14/2019 1155   LABBARB NONE DETECTED 07/14/2019 1155    Alcohol Level     Component Value Date/Time   [facility] <15 09/07/2024 0935   INR  Lab Results  Component Value Date   INR 1.1 07-Sep-2024   APTT  Lab Results  Component Value Date   APTT 31 07-Sep-2024   AED levels: No results found for: PHENYTOIN, ZONISAMIDE, LAMOTRIGINE, LEVETIRACETA  CT Head without contrast(Personally reviewed): Well-circumscribed ovoid right inferior parietal lobe mass measuring up to 3.8  x 2.3 cm with surrounding vasogenic edema and moderate mass effect on the atrium of the right lateral ventricle  MRI Brain(Personally reviewed):  3.8 x 2.6 x 3.4 cm lesion in the inferior right parietal lobe suggestive of subacute parenchymal hematoma. No evidence of underlyign enhancing lesion. Consider MRI in 3 months  to document complete resolution. Surrounding edema, local mass effect, and partial ventricular effacement. No acute infarct. Small remote right cerebellar infarct. Left parietal encephalomalacia with susceptibility, compatible with prior hemorrhagic infarct. Multiple scattered sulcal foci of susceptibility bilaterally, compatible with sequelae of prior subarachnoid hemorrhage. Mild chronic microvascular ischemic changes.   ASSESSMENT   Thomas Norman is a 73 y.o. 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   On exam, patient has some short-term memory deficits and is unable to provide a complete history without input from his wife at bedside. Patient also expressed concern over developing dementia, as his mother has this (her age of onset was approx 75 per patient). Can add dementia panel labs.  MRI shows subacute right parietal IPH with evidence of prior ischemic and hemorrhagic infarcts.   RECOMMENDATIONS   Stroke workup: - SBP goal <160 - stroke education - no antithrombotics due to ICH - Echo with bubble study - Lipid panel, A1c for secondary stroke prevention measures - Outpatient follow-up MRI in 3-4 months.   - continue PTA Keppra  500mg  BID (patient states he took AM dose)  ______________________________________________________________________    Signed, Rocky JAYSON Likes,  NP [facility]   NEUROHOSPITALIST ADDENDUM Performed a face to face diagnostic evaluation.   I have reviewed the contents of history and physical exam as documented by PA/ARNP/Resident and agree with above documentation.  I have discussed and formulated the above plan as documented. Edits to the note have been made as needed.  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 Had a DSA in 2020 at OSH when he had a L parietal ICH which was negative for AVMs or aneurysms. He is not hypertensive, bleed does not appear traumatic with no obvious overlying scalp hematoma or swelling or bruising, MRI not suggestive of cerebral amyloid angiopathy, no thrombocytopenia, Pt/INR are not suggestive of coagulopathy, no hx of cancer and CBC not suggestive of lymphoma/leukemia.  Oliviya Gilkison, MD [facility] [phone]   If 7pm to 7am, please call on call as listed on AMION.

## 2024-08-16 NOTE — ED Triage Notes (Signed)
 PT here for neurologic symptoms including forgetfulness, confusion/AMS beginning end of September and progressively worsening.  Has an appt with neurologist next week but was told to come here if they felt like symptoms were too bad to wait for appt.

## 2024-08-16 NOTE — ED Provider Notes (Addendum)
 [facility] Provider Note   CSN: [id number] Arrival date & time: 08/16/24  9144     Patient presents with: Neurologic Problem and Altered Mental Status   Thomas Norman is a 73 y.o. male patient with history of intracranial hemorrhage back in 2020 with subsequent grand mal seizure currently on Keppra  and hypertension who presents to the emergency department today for further evaluation of gradual worsening confusion for the last month.  Wife is at bedside and provides some of the history.  She states that the patient has been more forgetful primarily with short-term memory over the last month.  Has been gradually worsening.  He states that he has been having trouble telling the time and with simple mathematical equations.  Patient is a retired IT TRAINER.  Wife states that he has also been having some emotional regulation issues where simple conversations turn into him getting upset which is abnormal for him.  She also states that he has been more OCD than normal continually riding events down which she has done in the past but this is worse.  Wife also states that he is having trouble with his depth perception and driving vehicle.  No visual disturbances.  Patient denies any focal weakness or numbness or headaches.  He denies any weight loss, night sweats, chills.  Wife does state that he had a strange 100.6 fever last night which was abnormal for him.    Neurologic Problem  Altered Mental Status      Prior to Admission medications   Medication Sig Start Date End Date Taking? Authorizing Provider  amLODipine  (NORVASC ) 2.5 MG tablet Take 1 tablet (2.5 mg total) by mouth daily. 07/01/22 12/13/24  Pokhrel, Laxman, MD  Coenzyme Q10 (CO Q 10 PO) Take 1 tablet by mouth daily.    [provider]  levETIRAcetam  (KEPPRA ) 500 MG tablet Take 1 tablet (500 mg total) by mouth 2 (two) times daily. 12/14/23 12/13/24  Whitfield Raisin, NP  metoprolol  tartrate  (LOPRESSOR ) 25 MG tablet Take 1 tablet (25 mg total) by mouth 2 (two) times daily. 07/01/22 12/13/24  Pokhrel, Laxman, MD  Multiple Vitamins-Minerals (VITAMIN D3 COMPLETE PO) Take 1 tablet by mouth daily.    [provider]  Omega-3 Fatty Acids (FISH OIL) 1000 MG CAPS Take by mouth 3 (three) times a week.    [provider]    Allergies: Ace inhibitors and Sulfonamide derivatives    Review of Systems  All other systems reviewed and are negative.   Updated Vital Signs BP 120/78   Pulse 78   Temp 100.1 F (37.8 C) (Oral)   Resp (!) 24   Ht 5' 10 (1.778 m)   Wt 73 kg   SpO2 100%   BMI 23.10 kg/m   Physical Exam Vitals and nursing note reviewed.  Constitutional:      General: He is not in acute distress.    Appearance: Normal appearance.  HENT:     Head: Normocephalic and atraumatic.  Eyes:     General:        Right eye: No discharge.        Left eye: No discharge.  Cardiovascular:     Comments: Regular rate and rhythm.  S1/S2 are distinct without any evidence of murmur, rubs, or gallops.  Radial pulses are 2+ bilaterally.  Dorsalis pedis pulses are 2+ bilaterally.  No evidence of pedal edema. Pulmonary:     Comments: Clear to auscultation bilaterally.  Normal effort.  No respiratory distress.  No evidence of wheezes, rales, or rhonchi heard throughout. Abdominal:     General: Abdomen is flat. Bowel sounds are normal. There is no distension.     Tenderness: There is no abdominal tenderness. There is no guarding or rebound.  Musculoskeletal:        General: Normal range of motion.     Cervical back: Neck supple.  Skin:    General: Skin is warm and dry.     Findings: No rash.  Neurological:     General: No focal deficit present.     Mental Status: He is alert and oriented to person, place, and time.     GCS: GCS eye subscore is 4. GCS verbal subscore is 5. GCS motor subscore is 6.     Comments: Cranial nerves II to XII are intact.  5/5 strength to the  upper and lower extremities.  Normal sensation to the upper and lower extremities.  No dysmetria with finger-to-nose.  Extraocular ocular movements are intact.  Psychiatric:        Mood and Affect: Mood normal.        Behavior: Behavior normal.     (all labs ordered are listed, but only abnormal results are displayed) Labs Reviewed  DIFFERENTIAL - Abnormal; Notable for the following components:      Result Value   Lymphs Abs 0.4 (*)    All other components within normal limits  COMPREHENSIVE METABOLIC PANEL WITH GFR - Abnormal; Notable for the following components:   Creatinine, Ser 1.27 (*)    All other components within normal limits  I-STAT CHEM 8, ED - Abnormal; Notable for the following components:   Creatinine, Ser 1.30 (*)    Calcium, Ion 1.12 (*)    All other components within normal limits  CBG MONITORING, ED - Abnormal; Notable for the following components:   Glucose-Capillary 67 (*)    All other components within normal limits  CBG MONITORING, ED - Abnormal; Notable for the following components:   Glucose-Capillary 124 (*)    All other components within normal limits  PROTIME-INR  APTT  CBC  ETHANOL  CBG MONITORING, ED    EKG: None  Radiology: MR Brain W and Wo Contrast Result Date: 08/16/2024 EXAM: MRI BRAIN WITH AND WITHOUT CONTRAST 08/16/2024 02:08:28 PM TECHNIQUE: Multiplanar multisequence MRI of the head/brain was performed with and without the administration of intravenous contrast. COMPARISON: CT head and MRI head 07/01/2022. CLINICAL HISTORY: Mental status change, unknown cause. FINDINGS: BRAIN AND VENTRICLES: There is a 3.8 x 2.6 x 3.4 cm region of signal abnormality in the inferior right parietal lobe corresponding to the region of suspected parenchymal hematoma noted on CT. This lesion demonstrates intrinsic T1 hyperintensity which is along the peripheral aspect with additional T2 and FLAIR hyperintensity. There is a rim of susceptibility along the outer  margin. There is no evidence of enhancement within the lesion. Surrounding edema within the right parietal lobe extending into the posterior right temporal lobe with local mass effect and sulcal effacement. There is partial effacement of the posterior temporal horn and atrium of the right lateral ventricle. There is minimal 1 mm leftward midline shift demonstrated. The basilar cisterns are patent. Unremarkable appearance of the posterior fossa. Small remote infarct in the right cerebellum. No acute infarct. Scattered supratentorial white matter signal abnormality suggestive of mild chronic microvascular disease. Additional focus of encephalomalacia and susceptibility in the left parietal lobe suggestive of prior hemorrhagic infarct. There are multiple scattered areas  of susceptibility over the bilateral sulci likely reflecting sequelae of prior subarachnoid hemorrhage. No evidence of intracranial enhancing lesion. The sella is unremarkable. Normal flow voids. No hydrocephalus. ORBITS: Bilateral lens replacement. SINUSES: No acute abnormality. BONES AND SOFT TISSUES: Normal bone marrow signal and enhancement. No acute soft tissue abnormality. IMPRESSION: 1. 3.8 x 2.6 x 3.4 cm lesion in the inferior right parietal lobe suggestive of subacute parenchymal hematoma. No evidence of underlyign enhancing lesion. Consider MRI in 3 months to document complete resolution. 2. Surrounding edema, local mass effect, and partial ventricular effacement. 3. No acute infarct. 4. Small remote right cerebellar infarct. 5. Left parietal encephalomalacia with susceptibility, compatible with prior hemorrhagic infarct. 6. Multiple scattered sulcal foci of susceptibility bilaterally, compatible with sequelae of prior subarachnoid hemorrhage. 7. Mild chronic microvascular ischemic changes. Electronically signed by: Donnice Mania MD 08/16/2024 02:36 PM EDT RP Workstation: [id number]   CT HEAD WO CONTRAST Result Date: 08/16/2024 EXAM: CT  HEAD WITHOUT CONTRAST 08/16/2024 09:35:00 AM TECHNIQUE: CT of the head was performed without the administration of intravenous contrast. Automated exposure control, iterative reconstruction, and/or weight based adjustment of the mA/kV was utilized to reduce the radiation dose to as low as reasonably achievable. COMPARISON: CT head 06/30/2022. MRI brain 07/01/2022. CLINICAL HISTORY: Memory loss. FINDINGS: BRAIN AND VENTRICLES: No acute hemorrhage. Well circumscribed, ovoid hypoattenuating mass in the right inferior parietal lobe measuring up to 3.8 x 2.3 cm on axial image 17 series 3. Surrounding vasogenic edema and moderate mass effect on the atrium of the right lateral ventricle. Mild internal heterogeneity is suggestive of subacute hematoma. Neoplasm remains in the differential. Encephalomalacia from prior hemorrhage in the left parietal lobe. Background of moderate chronic small vessel disease. No evidence of acute infarct. No hydrocephalus. No extra-axial collection. No midline shift. ORBITS: No acute abnormality. SINUSES: No acute abnormality. SOFT TISSUES AND SKULL: No acute soft tissue abnormality. No skull fracture. IMPRESSION: 1. Well-circumscribed ovoid right inferior parietal lobe mass measuring up to 3.8 x 2.3 cm with surrounding vasogenic edema and moderate mass effect on the atrium of the right lateral ventricle, favored subacute intraparenchymal hematoma though neoplasm remains in the differential. MRI of the brain with and without contrast is recommended for further characterization. Electronically signed by: Ryan Chess MD 08/16/2024 09:41 AM EDT RP Workstation: [id number]     Procedures   Medications Ordered in the ED  sodium chloride  flush (NS) 0.9 % injection 3 mL (3 mLs Intravenous Given 08/16/24 1123)  gadobutrol  (GADAVIST ) 1 MMOL/ML injection 7 mL (7 mLs Intravenous Contrast Given 08/16/24 1409)    Clinical Course as of 08/16/24 1546  Fri Aug 16, 2024  1441 I spoke with Dr.  Vanessa who recommends holding off on steroids for right now and getting the MRI of brain with without contrast. [CF]  1459 CBC Negative. [CF]  1500 Comprehensive metabolic panel(!) Negative. [CF]  1500 Protime-INR Negative. [CF]  1500 APTT Negative. [CF]  1500 Ethanol Negative. [CF]  1500 CT HEAD WO CONTRAST There is some evidence of a intraparenchymal mass on the right side with some vasogenic edema.  Will proceed with MRI.  I do agree with the radiologist interpretation. [CF]  1514 I spoke with Johnanna with neurosurgery who will speak to the attending and get back to me.  They do not feel that anything acutely needs to be done at this time. [CF]  1521 I spoke with Johnanna RIGGERS again and she states nothing for neurosurgery to do and he can follow-up with neurology. [CF]  1524 MR Brain W and Wo Contrast There is evidence of intraparenchymal hemorrhage that is subacute.  I do agree with radiologist interpretation. [CF]    Clinical Course User Index [CF] Theotis Cameron HERO, PA-C    Medical Decision Making LARELL BANEY is a 73 y.o. male patient who presents to the emergency department today for further evaluation of confusion.  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  Low suspicion for metabolic derangements, infectious causes given the lack of vital sign abnormalities patient is overall neurologically intact.  As highlighted in the ED course I have spoken with neurology initially we will hold off on steroids for now given that the CT scan does show a space-occupying lesion.  MRI does confirm intraparenchymal hemorrhage that is subacute.  I spoke with neurosurgery who states there is nothing for them to do and he can follow-up with neurology.  I spoke with Dr. Vanessa with neurology who does feel that the patient should be mated as we need to figure out why the  patient is bleeding.  I think is an appropriate plan.  Neurology will consult and page will go out for hospitalist admission.  Due to shift change, the rest of the patient's care be transferred to oncoming provider.  I spoke with the family at length at the bedside about patient's diagnosis and that the patient be admitted.  Family is on board.  Hospitalists consult pending.  Amount and/or Complexity of Data Reviewed Labs: ordered. Decision-making details documented in ED Course. Radiology: ordered. Decision-making details documented in ED Course.  Risk Prescription drug management.     Final diagnoses:  Intraparenchymal hemorrhage of brain [facility])    ED Discharge Orders     None          Theotis Cameron HERO, [place] 08/16/24 1529    Theotis Cameron M, PA-C 08/16/24 1546    Pamella Ozell LABOR, DO 08/24/24 2304

## 2024-08-16 NOTE — ED Provider Notes (Signed)
 At the end of shift change patient was pending consultation with hospitalist for admission.  See previous note for full details. Physical Exam  BP 130/82   Pulse 81   Temp 99.8 F (37.7 C) (Oral)   Resp (!) 28   Ht 5' 10 (1.778 m)   Wt 73 kg   SpO2 96%   BMI 23.10 kg/m     Procedures  Procedures  ED Course / MDM   Clinical Course as of 08/16/24 1726  Fri Aug 16, 2024  1441 I spoke with Dr. Vanessa who recommends holding off on steroids for right now and getting the MRI of brain with without contrast. [CF]  1459 CBC Negative. [CF]  1500 Comprehensive metabolic panel(!) Negative. [CF]  1500 Protime-INR Negative. [CF]  1500 APTT Negative. [CF]  1500 Ethanol Negative. [CF]  1500 CT HEAD WO CONTRAST There is some evidence of a intraparenchymal mass on the right side with some vasogenic edema.  Will proceed with MRI.  I do agree with the radiologist interpretation. [CF]  1514 I spoke with Johnanna with neurosurgery who will speak to the attending and get back to me.  They do not feel that anything acutely needs to be done at this time. [CF]  1521 I spoke with Johnanna RIGGERS again and she states nothing for neurosurgery to do and he can follow-up with neurology. [CF]  1524 MR Brain W and Wo Contrast There is evidence of intraparenchymal hemorrhage that is subacute.  I do agree with radiologist interpretation. [CF]    Clinical Course User Index [CF] Theotis Cameron HERO, PA-C   Medical Decision Making Amount and/or Complexity of Data Reviewed Labs: ordered. Decision-making details documented in ED Course. Radiology: ordered. Decision-making details documented in ED Course.  Risk Prescription drug management. Decision regarding hospitalization.   Discussed with hospitalist.  They will evaluate patient for admission.       Hildegard Loge, PA-C 08/16/24 1726    Tegeler, Lonni PARAS, MD 08/16/24 2239

## 2024-08-16 NOTE — H&P (Signed)
 History and Physical   Thomas Norman FMW:[id number] DOB: 05/08/1951 DOA: 08/16/2024  PCP: Hugh Charleston, MD (Inactive)   Patient coming from: Home  Chief Complaint: Worsening confusion  HPI: Thomas Norman is a 73 y.o. male with medical history significant of hypertension, hyperlipidemia, hemorrhagic CVA with seizure presenting with altered mental status.  History obtained with assistance of patient wife.  Patient has had worsening confusion for the past month or so.  His short-term memory has been significantly worsening.  Noted to have trouble telling time and trouble with math which is atypical for him as he is a retired IT trainer.  Has also noted issues with emotional regulation.  Did have a fever last night at 100.6 but no other fevers.  Denies chills, chest pain, shortness of breath, abdominal pain, constipation, diarrhea, nausea, vomiting.  Denies any decreased strength or sensation  I still taking Keppra  after prior hemorrhagic stroke and seizure.  ED Course: Vital signs in the ED notable for respiratory rate in the 20s.  Lab workup included CMP with creatinine stable at 1.27.  CBC within normal limits.  PT, PTT, INR within normal limits.  Ethanol level negative.  CT head showed inferior parietal lobe mass measuring 3.8 x 2.3 cm with surrounding vasogenic edema, hematoma favored but MRI recommended.  MRI brain confirmed changes consistent with parenchymal hematoma measuring 3.8 x 2.6 x 3.4 cm.  Noted to have surrounding edema with local mass effect including partial ventricular effacement.  Also noted to have changes compatible with prior hemorrhagic infarcts.  Neurosurgery consulted and stated that they had nothing to add at this time.  Neurology consulted and recommended admission for further workup of these spontaneous bleeds considering the extent of the bleed and that he previously had hemorrhagic CVA causing seizures.  Review of Systems: As per HPI otherwise all other systems  reviewed and are negative.  Past Medical History:  Diagnosis Date   Acute respiratory failure (HCC)    Hypertension    ICH (intracerebral hemorrhage) (HCC) 07/14/2019   Intracranial hemorrhage (HCC)    Kidney calculi    Seizures (HCC)     Past Surgical History:  Procedure Laterality Date   LITHOTRIPSY     skin cancers     TONSILLECTOMY      Social History  reports that he has never smoked. He has never used smokeless tobacco. He reports current alcohol use of about 1.0 standard drink of alcohol per week. He reports that he does not use drugs.  Allergies  Allergen Reactions   Ace Inhibitors Other (See Comments)   Sulfonamide Derivatives Rash    Family History  Problem Relation Age of Onset   Diabetes Mother    Dementia Mother   Reviewed on admission  Prior to Admission medications   Medication Sig Start Date End Date Taking? Authorizing Provider  amLODipine  (NORVASC ) 2.5 MG tablet Take 1 tablet (2.5 mg total) by mouth daily. 07/01/22 12/13/24  Pokhrel, Laxman, MD  Coenzyme Q10 (CO Q 10 PO) Take 1 tablet by mouth daily.    [provider]  levETIRAcetam  (KEPPRA ) 500 MG tablet Take 1 tablet (500 mg total) by mouth 2 (two) times daily. 12/14/23 12/13/24  Whitfield Raisin, NP  metoprolol  tartrate (LOPRESSOR ) 25 MG tablet Take 1 tablet (25 mg total) by mouth 2 (two) times daily. 07/01/22 12/13/24  Pokhrel, Laxman, MD  Multiple Vitamins-Minerals (VITAMIN D3 COMPLETE PO) Take 1 tablet by mouth daily.    [provider]  Omega-3 Fatty Acids (FISH OIL)  1000 MG CAPS Take by mouth 3 (three) times a week.    [provider]    Physical Exam: Vitals:   08/16/24 1236 08/16/24 1546 08/16/24 1615 08/16/24 1619  BP: 120/78 122/82 130/82   Pulse: 78 90 86 81  Resp: (!) 24 (!) 21 17 (!) 28  Temp: 100.1 F (37.8 C) 99.8 F (37.7 C)    TempSrc: Oral Oral    SpO2: 100% 99% 98% 96%  Weight:      Height:        Physical Exam Constitutional:      General: He is not  in acute distress.    Appearance: Normal appearance.  HENT:     Head: Normocephalic and atraumatic.     Mouth/Throat:     Mouth: Mucous membranes are moist.     Pharynx: Oropharynx is clear.  Eyes:     Extraocular Movements: Extraocular movements intact.     Pupils: Pupils are equal, round, and reactive to light.  Cardiovascular:     Rate and Rhythm: Normal rate and regular rhythm.     Pulses: Normal pulses.     Heart sounds: Normal heart sounds.  Pulmonary:     Effort: Pulmonary effort is normal. No respiratory distress.     Breath sounds: Normal breath sounds.  Abdominal:     General: Bowel sounds are normal. There is no distension.     Palpations: Abdomen is soft.     Tenderness: There is no abdominal tenderness.  Musculoskeletal:        General: No swelling or deformity.  Skin:    General: Skin is warm and dry.  Neurological:     Comments: Mental Status: Patient is awake, alert No signs of aphasia or neglect Cranial Nerves: II: Pupils equal, round, and reactive to light.   III,IV, VI: EOMI without ptosis or diploplia.  V: Facial sensation is symmetric to light touch. VII: Facial movement is symmetric.  VIII: hearing is intact to voice X: Uvula elevates symmetrically XI: Shoulder shrug is symmetric. XII: tongue is midline without atrophy or fasciculations.  Motor: Good effort thorughout, at Least 5/5 bilateral UE, 5/5 bilateral lower extremitiy  Sensory: Sensation is grossly intact bilateral UEs & LEs Cerebellar: Finger-Nose intact bilalat    Labs on Admission: I have personally reviewed following labs and imaging studies  CBC: Recent Labs  Lab 08/16/24 0935 08/16/24 0947  WBC 5.4  --   NEUTROABS 4.3  --   HGB 14.8 14.6  HCT 44.5 43.0  MCV 89.7  --   PLT 189  --     Basic Metabolic Panel: Recent Labs  Lab 08/16/24 0935 08/16/24 0947  NA 137 137  K 4.3 4.4  CL 101 102  CO2 23  --   GLUCOSE 80 79  BUN 14 15  CREATININE 1.27* 1.30*  CALCIUM 9.0   --     GFR: Estimated Creatinine Clearance: 53.8 mL/min (A) (by C-G formula based on SCr of 1.3 mg/dL (H)).  Liver Function Tests: Recent Labs  Lab 08/16/24 0935  AST 22  ALT 16  ALKPHOS 64  BILITOT 0.6  PROT 7.1  ALBUMIN 4.0    Urine analysis:    Component Value Date/Time   COLORURINE YELLOW 07/01/2022 1152   APPEARANCEUR HAZY (A) 07/01/2022 1152   LABSPEC 1.019 07/01/2022 1152   PHURINE 5.0 07/01/2022 1152   GLUCOSEU NEGATIVE 07/01/2022 1152   HGBUR NEGATIVE 07/01/2022 1152   BILIRUBINUR NEGATIVE 07/01/2022 1152   KETONESUR 5 (  A) 07/01/2022 1152   PROTEINUR NEGATIVE 07/01/2022 1152   NITRITE NEGATIVE 07/01/2022 1152   LEUKOCYTESUR NEGATIVE 07/01/2022 1152    Radiological Exams on Admission: MR Brain W and Wo Contrast Result Date: 08/16/2024 EXAM: MRI BRAIN WITH AND WITHOUT CONTRAST 08/16/2024 02:08:28 PM TECHNIQUE: Multiplanar multisequence MRI of the head/brain was performed with and without the administration of intravenous contrast. COMPARISON: CT head and MRI head 07/01/2022. CLINICAL HISTORY: Mental status change, unknown cause. FINDINGS: BRAIN AND VENTRICLES: There is a 3.8 x 2.6 x 3.4 cm region of signal abnormality in the inferior right parietal lobe corresponding to the region of suspected parenchymal hematoma noted on CT. This lesion demonstrates intrinsic T1 hyperintensity which is along the peripheral aspect with additional T2 and FLAIR hyperintensity. There is a rim of susceptibility along the outer margin. There is no evidence of enhancement within the lesion. Surrounding edema within the right parietal lobe extending into the posterior right temporal lobe with local mass effect and sulcal effacement. There is partial effacement of the posterior temporal horn and atrium of the right lateral ventricle. There is minimal 1 mm leftward midline shift demonstrated. The basilar cisterns are patent. Unremarkable appearance of the posterior fossa. Small remote infarct in  the right cerebellum. No acute infarct. Scattered supratentorial white matter signal abnormality suggestive of mild chronic microvascular disease. Additional focus of encephalomalacia and susceptibility in the left parietal lobe suggestive of prior hemorrhagic infarct. There are multiple scattered areas of susceptibility over the bilateral sulci likely reflecting sequelae of prior subarachnoid hemorrhage. No evidence of intracranial enhancing lesion. The sella is unremarkable. Normal flow voids. No hydrocephalus. ORBITS: Bilateral lens replacement. SINUSES: No acute abnormality. BONES AND SOFT TISSUES: Normal bone marrow signal and enhancement. No acute soft tissue abnormality. IMPRESSION: 1. 3.8 x 2.6 x 3.4 cm lesion in the inferior right parietal lobe suggestive of subacute parenchymal hematoma. No evidence of underlyign enhancing lesion. Consider MRI in 3 months to document complete resolution. 2. Surrounding edema, local mass effect, and partial ventricular effacement. 3. No acute infarct. 4. Small remote right cerebellar infarct. 5. Left parietal encephalomalacia with susceptibility, compatible with prior hemorrhagic infarct. 6. Multiple scattered sulcal foci of susceptibility bilaterally, compatible with sequelae of prior subarachnoid hemorrhage. 7. Mild chronic microvascular ischemic changes. Electronically signed by: Donnice Mania MD 08/16/2024 02:36 PM EDT RP Workstation: [id number]   CT HEAD WO CONTRAST Result Date: 08/16/2024 EXAM: CT HEAD WITHOUT CONTRAST 08/16/2024 09:35:00 AM TECHNIQUE: CT of the head was performed without the administration of intravenous contrast. Automated exposure control, iterative reconstruction, and/or weight based adjustment of the mA/kV was utilized to reduce the radiation dose to as low as reasonably achievable. COMPARISON: CT head 06/30/2022. MRI brain 07/01/2022. CLINICAL HISTORY: Memory loss. FINDINGS: BRAIN AND VENTRICLES: No acute hemorrhage. Well circumscribed, ovoid  hypoattenuating mass in the right inferior parietal lobe measuring up to 3.8 x 2.3 cm on axial image 17 series 3. Surrounding vasogenic edema and moderate mass effect on the atrium of the right lateral ventricle. Mild internal heterogeneity is suggestive of subacute hematoma. Neoplasm remains in the differential. Encephalomalacia from prior hemorrhage in the left parietal lobe. Background of moderate chronic small vessel disease. No evidence of acute infarct. No hydrocephalus. No extra-axial collection. No midline shift. ORBITS: No acute abnormality. SINUSES: No acute abnormality. SOFT TISSUES AND SKULL: No acute soft tissue abnormality. No skull fracture. IMPRESSION: 1. Well-circumscribed ovoid right inferior parietal lobe mass measuring up to 3.8 x 2.3 cm with surrounding vasogenic edema and moderate mass effect  on the atrium of the right lateral ventricle, favored subacute intraparenchymal hematoma though neoplasm remains in the differential. MRI of the brain with and without contrast is recommended for further characterization. Electronically signed by: Ryan Chess MD 08/16/2024 09:41 AM EDT RP Workstation: [id number]   EKG: Independently reviewed.  Sinus rhythm at 77 beats minute.  Nonspecific T wave changes.  Assessment/Plan Principal Problem:   Intraparenchymal hematoma of brain (HCC) Active Problems:   HLD (hyperlipidemia)   Essential hypertension   History of hemorrhagic cerebrovascular accident (CVA) without residual deficits   Seizures (HCC)   Intraparenchymal hematoma of the brain History of hemorrhagic CVA with associated seizures > Patient presenting with worsening confusion and Cognitive/executive deficits as per HPI. > No focal strength or sensation deficits. > Found to have 3.8 x 2.6 x 3.4 cm intraparenchymal hematoma. > History of prior spontaneous hemorrhagic stroke and associated seizure at that time. > Neurosurgery consulted and there is no indication for their involvement  at this time per consultant. > Neurology consulted and recommended observation and workup of the spontaneous bleeds due to their severity and recurrence. - Appreciate neurology recommendations and assistance - Monitor on progressive unit for now - Continue home Keppra  for prophylaxis.  - EEG - BP goal less than 160 - Echocardiogram - Lipid panel, A1c  Hypertension - Continue amlodipine  and metoprolol , goal less than 160 as above  DVT prophylaxis: SCDs Code Status:   Full Family Communication:  Updated at bedside  Disposition Plan:   Patient is from:  Home  Anticipated DC to:  Home  Anticipated DC date:  1 to 3 days  Anticipated DC barriers: None  Consults called:  Neurosurgery, signed off.  Neurology, following. Admission status:  Observation, progressive  Severity of Illness: The appropriate patient status for this patient is OBSERVATION. Observation status is judged to be reasonable and necessary in order to provide the required intensity of service to ensure the patient's safety. The patient's presenting symptoms, physical exam findings, and initial radiographic and laboratory data in the context of their medical condition is felt to place them at decreased risk for further clinical deterioration. Furthermore, it is anticipated that the patient will be medically stable for discharge from the hospital within 2 midnights of admission.    [name] MD [facility]  How to contact the [facility] Attending or Consulting provider 7A - 7P or covering provider during after hours 7P -7A, for this patient?   Check the care team in [facility] and look for a) attending/consulting [facility] provider listed and b) the [facility] team listed Log into [email or web address] and use 's universal password to access. If you do not have the password, please contact the hospital operator. Locate the [facility] provider you are looking for under [facility] and page to a number that you can be directly  reached. If you still have difficulty reaching the provider, please page the [facility] (Director on Call) for the Hospitalists listed on amion for assistance.  08/16/2024, 5:04 PM

## 2024-08-17 ENCOUNTER — Observation Stay (HOSPITAL_COMMUNITY)

## 2024-08-17 ENCOUNTER — Encounter (HOSPITAL_COMMUNITY): Payer: Self-pay | Admitting: Internal Medicine

## 2024-08-17 DIAGNOSIS — I611 Nontraumatic intracerebral hemorrhage in hemisphere, cortical: Secondary | ICD-10-CM | POA: Diagnosis not present

## 2024-08-17 DIAGNOSIS — R29700 NIHSS score 0: Secondary | ICD-10-CM | POA: Diagnosis not present

## 2024-08-17 DIAGNOSIS — G936 Cerebral edema: Secondary | ICD-10-CM | POA: Diagnosis not present

## 2024-08-17 DIAGNOSIS — I619 Nontraumatic intracerebral hemorrhage, unspecified: Secondary | ICD-10-CM | POA: Diagnosis not present

## 2024-08-17 DIAGNOSIS — I639 Cerebral infarction, unspecified: Secondary | ICD-10-CM | POA: Diagnosis not present

## 2024-08-17 DIAGNOSIS — E785 Hyperlipidemia, unspecified: Secondary | ICD-10-CM | POA: Diagnosis not present

## 2024-08-17 DIAGNOSIS — R569 Unspecified convulsions: Secondary | ICD-10-CM | POA: Diagnosis not present

## 2024-08-17 DIAGNOSIS — Z8673 Personal history of transient ischemic attack (TIA), and cerebral infarction without residual deficits: Secondary | ICD-10-CM | POA: Diagnosis not present

## 2024-08-17 DIAGNOSIS — I10 Essential (primary) hypertension: Secondary | ICD-10-CM | POA: Diagnosis not present

## 2024-08-17 DIAGNOSIS — R4182 Altered mental status, unspecified: Secondary | ICD-10-CM | POA: Diagnosis not present

## 2024-08-17 LAB — CBC
HCT: 40.2 % (ref 39.0–52.0)
Hemoglobin: 13.8 g/dL (ref 13.0–17.0)
MCH: 30.4 pg (ref 26.0–34.0)
MCHC: 34.3 g/dL (ref 30.0–36.0)
MCV: 88.5 fL (ref 80.0–100.0)
Platelets: 163 K/uL (ref 150–400)
RBC: 4.54 MIL/uL (ref 4.22–5.81)
RDW: 11.9 % (ref 11.5–15.5)
WBC: 4.7 K/uL (ref 4.0–10.5)
nRBC: 0 % (ref 0.0–0.2)

## 2024-08-17 LAB — LIPID PANEL
Cholesterol: 150 mg/dL (ref 0–200)
HDL: 45 mg/dL (ref >40)
LDL Cholesterol: 94 mg/dL (ref 0–99)
Total CHOL/HDL Ratio: 3.3 ratio
Triglycerides: 55 mg/dL (ref <150)
VLDL: 11 mg/dL (ref 0–40)

## 2024-08-17 LAB — ECHOCARDIOGRAM COMPLETE BUBBLE STUDY
Area-P 1/2: 2.11 cm2
S' Lateral: 2.71 cm

## 2024-08-17 LAB — COMPREHENSIVE METABOLIC PANEL WITH GFR
ALT: 17 U/L (ref 0–44)
AST: 20 U/L (ref 15–41)
Albumin: 3.5 g/dL (ref 3.5–5.0)
Alkaline Phosphatase: 59 U/L (ref 38–126)
Anion gap: 11 (ref 5–15)
BUN: 14 mg/dL (ref 8–23)
CO2: 24 mmol/L (ref 22–32)
Calcium: 8.6 mg/dL — ABNORMAL LOW (ref 8.9–10.3)
Chloride: 100 mmol/L (ref 98–111)
Creatinine, Ser: 1.27 mg/dL — ABNORMAL HIGH (ref 0.61–1.24)
GFR, Estimated: >60 mL/min (ref >60)
Glucose, Bld: 94 mg/dL (ref 70–99)
Potassium: 4 mmol/L (ref 3.5–5.1)
Sodium: 135 mmol/L (ref 135–145)
Total Bilirubin: 0.7 mg/dL (ref 0.0–1.2)
Total Protein: 6.2 g/dL — ABNORMAL LOW (ref 6.5–8.1)

## 2024-08-17 LAB — FOLATE: Folate: 13.7 ng/mL (ref >5.9)

## 2024-08-17 LAB — VITAMIN B12: Vitamin B-12: 182 pg/mL (ref 180–914)

## 2024-08-17 LAB — SEDIMENTATION RATE: Sed Rate: 1 mm/h (ref 0–16)

## 2024-08-17 LAB — TSH: TSH: 0.518 u[IU]/mL (ref 0.350–4.500)

## 2024-08-17 LAB — RPR: RPR Ser Ql: NONREACTIVE

## 2024-08-17 MED ORDER — IOHEXOL 350 MG/ML SOLN
75.0000 mL | Freq: Once | INTRAVENOUS | Status: AC | PRN
  Administered 2024-08-17: 75 mL via INTRAVENOUS

## 2024-08-17 NOTE — Plan of Care (Signed)

## 2024-08-17 NOTE — Plan of Care (Signed)

## 2024-08-17 NOTE — Care Management Obs Status (Signed)
 MEDICARE OBSERVATION STATUS NOTIFICATION   Patient Details  Name: DORIAN DUVAL MRN: [id number] Date of Birth: [birth year]   Medicare Observation Status Notification Given:  Yes    Marval Gell, RN 08/17/2024, 4:37 PM

## 2024-08-17 NOTE — Progress Notes (Signed)
 PROGRESS NOTE        PATIENT DETAILS Name: Thomas Norman Age: 73 y.o. Sex: male Date of Birth: [birth year] Admit Date: 08/16/2024 Admitting Physician Thomas KATHEE Scurry, MD ERE:Zypwhzm, Lamar, MD (Inactive)  Brief Summary: Patient is a 73 y.o.  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  Significant events: 10/24>> admit to [facility]  Significant studies: 10/24>> CT head: Parietal lobe mass-surrounding vasogenic edema/mass effect-likely subacute intraparenchymal hematoma. 10/24>> MRI brain: 3.8 x 2.6 x 3.4 centimeter inferior right parietal lobe lesion-suggestive of subacute parenchymal hematoma.  Significant microbiology data: None  Procedures: None  Consults: Neurology  Subjective: No major issues overnight-answer simple questions appropriately-speech is fluent-moving all 4 extremities.  Per spouse at bedside-his confusion is mostly when it involves mathematical skills.  Objective: Vitals: Blood pressure 115/75, pulse 69, temperature 99.5 F (37.5 C), temperature source Oral, resp. rate 20, height 5' 10 (1.778 m), weight 73 kg, SpO2 93%.   Exam: Gen Exam:Alert awake-not in any distress HEENT:atraumatic, normocephalic Chest: B/L clear to auscultation anteriorly CVS:S1S2 regular Abdomen:soft non tender, non distended Extremities:no edema Neurology: Non focal Skin: no rash  Pertinent Labs/Radiology:    Latest Ref Rng & Units 08/17/2024    4:24 AM 08/16/2024    9:47 AM 08/16/2024    9:35 AM  CBC  WBC 4.0 - 10.5 K/uL 4.7   5.4   Hemoglobin 13.0 - 17.0 g/dL 86.1  85.3  85.1   Hematocrit 39.0 - 52.0 % 40.2  43.0  44.5   Platelets 150 - 400 K/uL 163   189     Lab Results  Component  Value Date   NA 135 08/17/2024   K 4.0 08/17/2024   CL 100 08/17/2024   CO2 24 08/17/2024     Assessment/Plan: Intraparenchymal hemorrhage Unclear etiology-does not appear hypertensive-prior workup including catheter angiogram at [facility] was negative for AVM/aneurysm.  No history of trauma.  Not on antiplatelet/anticoagulation. Per spouse-confusion is most related to mathematically issues Nonfocal exam CTA head/neck/EEG/echo pending Await evaluation by stroke MD. Await PT/OT/SLP eval.  History of seizures Continue Keppra  Await EEG  HTN BP stable Amlodipine /metoprolol   Code status:   Code Status: Full Code   DVT Prophylaxis: SCDs Start: 08/16/24 1652    Family Communication: Spouse at bedside   Disposition Plan: Status is: Observation The patient will require care spanning > 2 midnights and should be moved to inpatient because: Severity of illness   Planned Discharge Destination:Home   Diet: Diet Order             Diet regular Room service appropriate? Yes; Fluid consistency: Thin  Diet effective now                     Antimicrobial agents: Anti-infectives (From admission, onward)    None        MEDICATIONS: Scheduled Meds:  amLODipine   2.5 mg Oral Daily   levETIRAcetam   500 mg Oral BID   metoprolol  tartrate  25 mg Oral BID   sodium chloride  flush  3 mL Intravenous Q12H   Continuous Infusions: PRN Meds:.acetaminophen  **OR** acetaminophen , polyethylene glycol   I have personally reviewed following labs and imaging studies  LABORATORY DATA: CBC: Recent Labs  Lab 08/16/24 0935 08/16/24 0947 08/17/24 0424  WBC 5.4  --  4.7  NEUTROABS 4.3  --   --   HGB 14.8 14.6 13.8  HCT 44.5 43.0 40.2  MCV 89.7  --  88.5  PLT 189  --  163    Basic Metabolic Panel: Recent Labs  Lab 08/16/24 0935 08/16/24 0947 08/17/24 0424  NA 137 137 135  K 4.3 4.4 4.0  CL 101 102 100  CO2 23  --  24  GLUCOSE 80 79 94  BUN 14 15 14   CREATININE 1.27*  1.30* 1.27*  CALCIUM 9.0  --  8.6*    GFR: Estimated Creatinine Clearance: 55.1 mL/min (A) (by C-G formula based on SCr of 1.27 mg/dL (H)).  Liver Function Tests: Recent Labs  Lab 08/16/24 0935 08/17/24 0424  AST 22 20  ALT 16 17  ALKPHOS 64 59  BILITOT 0.6 0.7  PROT 7.1 6.2*  ALBUMIN 4.0 3.5   No results for input(s): LIPASE, AMYLASE in the last 168 hours. No results for input(s): AMMONIA in the last 168 hours.  Coagulation Profile: Recent Labs  Lab 08/16/24 0935  INR 1.1    Cardiac Enzymes: No results for input(s): CKTOTAL, CKMB, CKMBINDEX, TROPONINI in the last 168 hours.  BNP (last 3 results) No results for input(s): PROBNP in the last 8760 hours.  Lipid Profile: Recent Labs    08/17/24 0424  CHOL 150  HDL 45  LDLCALC 94  TRIG 55  CHOLHDL 3.3    Thyroid Function Tests: Recent Labs    08/17/24 0424  TSH 0.518    Anemia Panel: Recent Labs    08/17/24 0424  VITAMINB12 182  FOLATE 13.7    Urine analysis:    Component Value Date/Time   COLORURINE YELLOW 07/01/2022 1152   APPEARANCEUR HAZY (A) 07/01/2022 1152   LABSPEC 1.019 07/01/2022 1152   PHURINE 5.0 07/01/2022 1152   GLUCOSEU NEGATIVE 07/01/2022 1152   HGBUR NEGATIVE 07/01/2022 1152   BILIRUBINUR NEGATIVE 07/01/2022 1152   KETONESUR 5 (A) 07/01/2022 1152   PROTEINUR NEGATIVE 07/01/2022 1152   NITRITE NEGATIVE 07/01/2022 1152   LEUKOCYTESUR NEGATIVE 07/01/2022 1152    Sepsis Labs: Lactic Acid, Venous No results found for: LATICACIDVEN  MICROBIOLOGY: Recent Results (from the past 240 hours)  MRSA Next Gen by PCR, Nasal     Status: None   Collection Time: 08/16/24  8:48 PM   Specimen: Nasal Mucosa; Nasal Swab  Result Value Ref Range Status   MRSA by PCR Next Gen NOT DETECTED NOT DETECTED Final    Comment: (NOTE) The GeneXpert MRSA Assay (FDA approved for NASAL specimens only), is one component of a comprehensive MRSA colonization surveillance program. It is  not intended to diagnose MRSA infection nor to guide or monitor treatment for MRSA infections. Test performance is not FDA approved in patients less than 29 years old. Performed at [facility], [address] [place], [place] [address]     RADIOLOGY STUDIES/RESULTS: MR Brain W and Wo Contrast Result Date: 08/16/2024 EXAM: MRI BRAIN WITH AND WITHOUT CONTRAST 08/16/2024 02:08:28 PM TECHNIQUE: Multiplanar multisequence MRI of the head/brain was performed with and without the administration of intravenous contrast. COMPARISON: CT head and MRI head 07/01/2022. CLINICAL HISTORY: Mental status change, unknown cause. FINDINGS: BRAIN AND VENTRICLES: There is a 3.8 x 2.6 x 3.4 cm region of signal abnormality in the inferior right parietal lobe corresponding to the region of suspected parenchymal hematoma  noted on CT. This lesion demonstrates intrinsic T1 hyperintensity which is along the peripheral aspect with additional T2 and FLAIR hyperintensity. There is a rim of susceptibility along the outer margin. There is no evidence of enhancement within the lesion. Surrounding edema within the right parietal lobe extending into the posterior right temporal lobe with local mass effect and sulcal effacement. There is partial effacement of the posterior temporal horn and atrium of the right lateral ventricle. There is minimal 1 mm leftward midline shift demonstrated. The basilar cisterns are patent. Unremarkable appearance of the posterior fossa. Small remote infarct in the right cerebellum. No acute infarct. Scattered supratentorial white matter signal abnormality suggestive of mild chronic microvascular disease. Additional focus of encephalomalacia and susceptibility in the left parietal lobe suggestive of prior hemorrhagic infarct. There are multiple scattered areas of susceptibility over the bilateral sulci likely reflecting sequelae of prior subarachnoid hemorrhage. No evidence of intracranial enhancing lesion.  The sella is unremarkable. Normal flow voids. No hydrocephalus. ORBITS: Bilateral lens replacement. SINUSES: No acute abnormality. BONES AND SOFT TISSUES: Normal bone marrow signal and enhancement. No acute soft tissue abnormality. IMPRESSION: 1. 3.8 x 2.6 x 3.4 cm lesion in the inferior right parietal lobe suggestive of subacute parenchymal hematoma. No evidence of underlyign enhancing lesion. Consider MRI in 3 months to document complete resolution. 2. Surrounding edema, local mass effect, and partial ventricular effacement. 3. No acute infarct. 4. Small remote right cerebellar infarct. 5. Left parietal encephalomalacia with susceptibility, compatible with prior hemorrhagic infarct. 6. Multiple scattered sulcal foci of susceptibility bilaterally, compatible with sequelae of prior subarachnoid hemorrhage. 7. Mild chronic microvascular ischemic changes. Electronically signed by: Donnice Mania MD 08/16/2024 02:36 PM EDT RP Workstation: [id number]   CT HEAD WO CONTRAST Result Date: 08/16/2024 EXAM: CT HEAD WITHOUT CONTRAST 08/16/2024 09:35:00 AM TECHNIQUE: CT of the head was performed without the administration of intravenous contrast. Automated exposure control, iterative reconstruction, and/or weight based adjustment of the mA/kV was utilized to reduce the radiation dose to as low as reasonably achievable. COMPARISON: CT head 06/30/2022. MRI brain 07/01/2022. CLINICAL HISTORY: Memory loss. FINDINGS: BRAIN AND VENTRICLES: No acute hemorrhage. Well circumscribed, ovoid hypoattenuating mass in the right inferior parietal lobe measuring up to 3.8 x 2.3 cm on axial image 17 series 3. Surrounding vasogenic edema and moderate mass effect on the atrium of the right lateral ventricle. Mild internal heterogeneity is suggestive of subacute hematoma. Neoplasm remains in the differential. Encephalomalacia from prior hemorrhage in the left parietal lobe. Background of moderate chronic small vessel disease. No evidence of acute  infarct. No hydrocephalus. No extra-axial collection. No midline shift. ORBITS: No acute abnormality. SINUSES: No acute abnormality. SOFT TISSUES AND SKULL: No acute soft tissue abnormality. No skull fracture. IMPRESSION: 1. Well-circumscribed ovoid right inferior parietal lobe mass measuring up to 3.8 x 2.3 cm with surrounding vasogenic edema and moderate mass effect on the atrium of the right lateral ventricle, favored subacute intraparenchymal hematoma though neoplasm remains in the differential. MRI of the brain with and without contrast is recommended for further characterization. Electronically signed by: Ryan Chess MD 08/16/2024 09:41 AM EDT RP Workstation: [id number]     LOS: 0 days   Donalda Applebaum, MD  [facility]    To contact the attending provider between 7A-7P or the covering provider during after hours 7P-7A, please log into the web site [email or web address] and access using universal [place] password for that web site. If you do not have the password, please call the hospital operator.  08/17/2024,  9:19 AM

## 2024-08-17 NOTE — Plan of Care (Signed)
  Problem: Education: Goal: Knowledge of General Education information will improve Description: Including pain rating scale, medication(s)/side effects and non-pharmacologic comfort measures Outcome: Progressing   Problem: Health Behavior/Discharge Planning: Goal: Ability to manage health-related needs will improve Outcome: Progressing   Problem: Clinical Measurements: Goal: Respiratory complications will improve Outcome: Progressing   Problem: Activity: Goal: Risk for activity intolerance will decrease Outcome: Progressing   Problem: Nutrition: Goal: Adequate nutrition will be maintained Outcome: Progressing   Problem: Coping: Goal: Level of anxiety will decrease Outcome: Progressing   Problem: Elimination: Goal: Will not experience complications related to bowel motility Outcome: Progressing   Problem: Pain Managment: Goal: General experience of comfort will improve and/or be controlled Outcome: Progressing   Problem: Safety: Goal: Ability to remain free from injury will improve Outcome: Progressing   Problem: Skin Integrity: Goal: Risk for impaired skin integrity will decrease Outcome: Progressing

## 2024-08-17 NOTE — Progress Notes (Signed)
 Echocardiogram 2D Echocardiogram has been performed.  Thomas Norman Thomas Norman RDCS 08/17/2024, 8:50 AM

## 2024-08-17 NOTE — Progress Notes (Signed)
 STROKE TEAM PROGRESS NOTE   SUBJECTIVE (INTERVAL HISTORY) His wife is at the bedside.  Overall his condition is rapidly improving.  Patient lying bed, awake alert orientated, neurologically largely intact.  Confusion and short-term memory loss has improved.  Discussed with patient and family about questionable CAA diagnosis for recurrent ICH.   OBJECTIVE Temp:  [98.2 F (36.8 C)-100.6 F (38.1 C)] 99 F (37.2 C) (10/25 1640) Pulse Rate:  [58-89] 69 (10/25 1640) Cardiac Rhythm: Sinus bradycardia;Other (Comment) (10/25 0700) Resp:  [16-26] 20 (10/25 1640) BP: (99-141)/(60-93) 115/93 (10/25 1640) SpO2:  [90 %-99 %] 94 % (10/25 1640)  Recent Labs  Lab 08/16/24 1108 08/16/24 1135 08/16/24 1205  GLUCAP 67* 81 124*   Recent Labs  Lab 08/16/24 0935 08/16/24 0947 08/17/24 0424  NA 137 137 135  K 4.3 4.4 4.0  CL 101 102 100  CO2 23  --  24  GLUCOSE 80 79 94  BUN 14 15 14   CREATININE 1.27* 1.30* 1.27*  CALCIUM 9.0  --  8.6*   Recent Labs  Lab 08/16/24 0935 08/17/24 0424  AST 22 20  ALT 16 17  ALKPHOS 64 59  BILITOT 0.6 0.7  PROT 7.1 6.2*  ALBUMIN 4.0 3.5   Recent Labs  Lab 08/16/24 0935 08/16/24 0947 08/17/24 0424  WBC 5.4  --  4.7  NEUTROABS 4.3  --   --   HGB 14.8 14.6 13.8  HCT 44.5 43.0 40.2  MCV 89.7  --  88.5  PLT 189  --  163   No results for input(s): CKTOTAL, CKMB, CKMBINDEX, TROPONINI in the last 168 hours. Recent Labs    08/16/24 0935  LABPROT 15.0  INR 1.1   No results for input(s): COLORURINE, LABSPEC, PHURINE, GLUCOSEU, HGBUR, BILIRUBINUR, KETONESUR, PROTEINUR, UROBILINOGEN, NITRITE, LEUKOCYTESUR in the last 72 hours.  Invalid input(s): APPERANCEUR     Component Value Date/Time   CHOL 150 08/17/2024 0424   TRIG 55 08/17/2024 0424   HDL 45 08/17/2024 0424   CHOLHDL 3.3 08/17/2024 0424   VLDL 11 08/17/2024 0424   LDLCALC 94 08/17/2024 0424   Lab Results  Component Value Date   HGBA1C 5.1 08/16/2024       Component Value Date/Time   LABOPIA NONE DETECTED 07/14/2019 1155   COCAINSCRNUR NONE DETECTED 07/14/2019 1155   LABBENZ NONE DETECTED 07/14/2019 1155   AMPHETMU NONE DETECTED 07/14/2019 1155   THCU NONE DETECTED 07/14/2019 1155   LABBARB NONE DETECTED 07/14/2019 1155    Recent Labs  Lab 08/16/24 0935  ETH <15    I have personally reviewed the radiological images below and agree with the radiology interpretations.  CT ANGIO HEAD NECK W WO CM Result Date: 08/17/2024 EXAM: CTA HEAD AND NECK WITH AND WITHOUT 08/17/2024 01:32:20 PM TECHNIQUE: CTA of the head and neck was performed with and without the administration of 75 mL of intravenous iohexol  (OMNIPAQUE ) 350 MG/ML injection. Multiplanar 2D and/or 3D reformatted images are provided for review. Automated exposure control, iterative reconstruction, and/or weight based adjustment of the mA/kV was utilized to reduce the radiation dose to as low as reasonably achievable. Stenosis of the internal carotid arteries measured using NASCET criteria. COMPARISON: Prior study. CLINICAL HISTORY: Stroke, hemorrhagic. Chief complaints; Neurologic Problem; Altered Mental Status. FINDINGS: CTA NECK: AORTIC ARCH AND ARCH VESSELS: No dissection or arterial injury. No significant stenosis of the brachiocephalic or subclavian arteries. CERVICAL CAROTID ARTERIES: No dissection, arterial injury, or hemodynamically significant stenosis by NASCET criteria. CERVICAL VERTEBRAL ARTERIES: No dissection, arterial  injury, or significant stenosis. LUNGS AND MEDIASTINUM: Unremarkable. SOFT TISSUES: No acute abnormality. BONES: No acute abnormality. CTA HEAD: BRAIN: The right parietal hemorrhage is stable in size. Strain of vasogenic edema is similar to prior study. No new hemorrhage is present. Chronic white matter changes are stable bilaterally. ORBITS: Bilateral lens replacements are noted. The globes and orbits are otherwise within normal limits. ANTERIOR CIRCULATION: No  significant stenosis of the internal carotid arteries. No significant stenosis of the anterior cerebral arteries. No significant stenosis of the middle cerebral arteries. No aneurysm. POSTERIOR CIRCULATION: No significant stenosis of the posterior cerebral arteries. No significant stenosis of the basilar artery. No significant stenosis of the vertebral arteries. No aneurysm. OTHER: No dural venous sinus thrombosis on this non-dedicated study. IMPRESSION: 1. Stable right parietal hemorrhage with similar vasogenic edema compared to prior study. No new hemorrhage. 2. Stable chronic white matter changes bilaterally. Electronically signed by: Lonni Necessary MD 08/17/2024 03:32 PM EDT RP Workstation: [id number]   ECHOCARDIOGRAM COMPLETE BUBBLE STUDY Result Date: 08/17/2024    ECHOCARDIOGRAM REPORT   Patient Name:   DESMOND SZABO Date of Exam: 08/17/2024 Medical Rec #:  [id number]       Height:       70.0 in Accession #:    [id number]      Weight:       161.0 lb Date of Birth:  1950/11/28       BSA:          1.903 m Patient Age:    73 years        BP:           111/71 mmHg Patient Gender: M               HR:           70 bpm. Exam Location:  Inpatient Procedure: 2D Echo, Color Doppler, Cardiac Doppler and Saline Contrast Bubble            Study (Both Spectral and Color Flow Doppler were utilized during            procedure). Indications:    Stroke i63.9  History:        Patient has no prior history of Echocardiogram examinations.                 Risk Factors:Hypertension and Dyslipidemia.  Sonographer:    Damien Senior RDCS Referring Phys: [id number] MARSA NOVAK MELVIN IMPRESSIONS  1. Left ventricular ejection fraction, by estimation, is 55 to 60%. The left ventricle has normal function. The left ventricle has no regional wall motion abnormalities. Left ventricular diastolic parameters were normal.  2. Right ventricular systolic function is normal. The right ventricular size is normal. Tricuspid regurgitation signal  is inadequate for assessing PA pressure.  3. The mitral valve is normal in structure. Trivial mitral valve regurgitation. No evidence of mitral stenosis.  4. The aortic valve is tricuspid. Aortic valve regurgitation is not visualized. No aortic stenosis is present.  5. The inferior vena cava is normal in size with greater than 50% respiratory variability, suggesting right atrial pressure of 3 mmHg.  6. Agitated saline contrast bubble study was positive with shunting observed after >6 cardiac cycles suggestive of intrapulmonary shunting. Conclusion(s)/Recommendation(s): No intracardiac source of embolism detected on this transthoracic study. Consider a transesophageal echocardiogram to exclude cardiac source of embolism if clinically indicated. FINDINGS  Left Ventricle: Left ventricular ejection fraction, by estimation, is 55 to 60%. The left ventricle has normal function. The  left ventricle has no regional wall motion abnormalities. The left ventricular internal cavity size was normal in size. There is  no left ventricular hypertrophy. Left ventricular diastolic parameters were normal. Right Ventricle: The right ventricular size is normal. No increase in right ventricular wall thickness. Right ventricular systolic function is normal. Tricuspid regurgitation signal is inadequate for assessing PA pressure. Left Atrium: Left atrial size was normal in size. Right Atrium: Right atrial size was normal in size. Pericardium: There is no evidence of pericardial effusion. Presence of epicardial fat layer. Mitral Valve: The mitral valve is normal in structure. Trivial mitral valve regurgitation. No evidence of mitral valve stenosis. Tricuspid Valve: The tricuspid valve is normal in structure. Tricuspid valve regurgitation is trivial. Aortic Valve: The aortic valve is tricuspid. Aortic valve regurgitation is not visualized. No aortic stenosis is present. Pulmonic Valve: The pulmonic valve was grossly normal. Pulmonic valve  regurgitation is trivial. No evidence of pulmonic stenosis. Aorta: The aortic root and ascending aorta are structurally normal, with no evidence of dilitation. Venous: The inferior vena cava is normal in size with greater than 50% respiratory variability, suggesting right atrial pressure of 3 mmHg. IAS/Shunts: No atrial level shunt detected by color flow Doppler. Agitated saline contrast was given intravenously to evaluate for intracardiac shunting. Agitated saline contrast bubble study was positive with shunting observed after >6 cardiac cycles suggestive of intrapulmonary shunting.  LEFT VENTRICLE PLAX 2D LVIDd:         4.52 cm   Diastology LVIDs:         2.71 cm   LV e' medial:    7.51 cm/s LV PW:         1.06 cm   LV E/e' medial:  6.4 LV IVS:        1.02 cm   LV e' lateral:   11.10 cm/s LVOT diam:     2.05 cm   LV E/e' lateral: 4.3 LV SV:         57 LV SV Index:   30 LVOT Area:     3.30 cm LV IVRT:       111 msec  RIGHT VENTRICLE RV S prime:     21.10 cm/s  PULMONARY VEINS TAPSE (M-mode): 2.2 cm      Diastolic Velocity: 44.50 cm/s                             S/D Velocity:       1.40                             Systolic Velocity:  62.50 cm/s LEFT ATRIUM             Index        RIGHT ATRIUM           Index LA diam:        3.73 cm 1.96 cm/m   RA Area:     19.50 cm LA Vol (A2C):   50.4 ml 26.48 ml/m  RA Volume:   53.10 ml  27.90 ml/m LA Vol (A4C):   39.8 ml 20.91 ml/m LA Biplane Vol: 48.8 ml 25.64 ml/m  AORTIC VALVE LVOT Vmax:   82.20 cm/s LVOT Vmean:  62.500 cm/s LVOT VTI:    0.173 m  AORTA Ao Root diam: 3.26 cm Ao Asc diam:  3.38 cm MITRAL VALVE MV Area (PHT): 2.11 cm  SHUNTS MV Decel Time: 359 msec    Systemic VTI:  0.17 m MV E velocity: 48.20 cm/s  Systemic Diam: 2.05 cm MV A velocity: 55.50 cm/s MV E/A ratio:  0.87 Darryle Decent MD Electronically signed by Darryle Decent MD Signature Date/Time: 08/17/2024/12:17:18 PM    Final    MR Brain W and Wo Contrast Result Date: 08/16/2024 EXAM: MRI BRAIN  WITH AND WITHOUT CONTRAST 08/16/2024 02:08:28 PM TECHNIQUE: Multiplanar multisequence MRI of the head/brain was performed with and without the administration of intravenous contrast. COMPARISON: CT head and MRI head 07/01/2022. CLINICAL HISTORY: Mental status change, unknown cause. FINDINGS: BRAIN AND VENTRICLES: There is a 3.8 x 2.6 x 3.4 cm region of signal abnormality in the inferior right parietal lobe corresponding to the region of suspected parenchymal hematoma noted on CT. This lesion demonstrates intrinsic T1 hyperintensity which is along the peripheral aspect with additional T2 and FLAIR hyperintensity. There is a rim of susceptibility along the outer margin. There is no evidence of enhancement within the lesion. Surrounding edema within the right parietal lobe extending into the posterior right temporal lobe with local mass effect and sulcal effacement. There is partial effacement of the posterior temporal horn and atrium of the right lateral ventricle. There is minimal 1 mm leftward midline shift demonstrated. The basilar cisterns are patent. Unremarkable appearance of the posterior fossa. Small remote infarct in the right cerebellum. No acute infarct. Scattered supratentorial white matter signal abnormality suggestive of mild chronic microvascular disease. Additional focus of encephalomalacia and susceptibility in the left parietal lobe suggestive of prior hemorrhagic infarct. There are multiple scattered areas of susceptibility over the bilateral sulci likely reflecting sequelae of prior subarachnoid hemorrhage. No evidence of intracranial enhancing lesion. The sella is unremarkable. Normal flow voids. No hydrocephalus. ORBITS: Bilateral lens replacement. SINUSES: No acute abnormality. BONES AND SOFT TISSUES: Normal bone marrow signal and enhancement. No acute soft tissue abnormality. IMPRESSION: 1. 3.8 x 2.6 x 3.4 cm lesion in the inferior right parietal lobe suggestive of subacute parenchymal  hematoma. No evidence of underlyign enhancing lesion. Consider MRI in 3 months to document complete resolution. 2. Surrounding edema, local mass effect, and partial ventricular effacement. 3. No acute infarct. 4. Small remote right cerebellar infarct. 5. Left parietal encephalomalacia with susceptibility, compatible with prior hemorrhagic infarct. 6. Multiple scattered sulcal foci of susceptibility bilaterally, compatible with sequelae of prior subarachnoid hemorrhage. 7. Mild chronic microvascular ischemic changes. Electronically signed by: Donnice Mania MD 08/16/2024 02:36 PM EDT RP Workstation: [id number]   CT HEAD WO CONTRAST Result Date: 08/16/2024 EXAM: CT HEAD WITHOUT CONTRAST 08/16/2024 09:35:00 AM TECHNIQUE: CT of the head was performed without the administration of intravenous contrast. Automated exposure control, iterative reconstruction, and/or weight based adjustment of the mA/kV was utilized to reduce the radiation dose to as low as reasonably achievable. COMPARISON: CT head 06/30/2022. MRI brain 07/01/2022. CLINICAL HISTORY: Memory loss. FINDINGS: BRAIN AND VENTRICLES: No acute hemorrhage. Well circumscribed, ovoid hypoattenuating mass in the right inferior parietal lobe measuring up to 3.8 x 2.3 cm on axial image 17 series 3. Surrounding vasogenic edema and moderate mass effect on the atrium of the right lateral ventricle. Mild internal heterogeneity is suggestive of subacute hematoma. Neoplasm remains in the differential. Encephalomalacia from prior hemorrhage in the left parietal lobe. Background of moderate chronic small vessel disease. No evidence of acute infarct. No hydrocephalus. No extra-axial collection. No midline shift. ORBITS: No acute abnormality. SINUSES: No acute abnormality. SOFT TISSUES AND SKULL: No acute soft tissue abnormality. No  skull fracture. IMPRESSION: 1. Well-circumscribed ovoid right inferior parietal lobe mass measuring up to 3.8 x 2.3 cm with surrounding vasogenic  edema and moderate mass effect on the atrium of the right lateral ventricle, favored subacute intraparenchymal hematoma though neoplasm remains in the differential. MRI of the brain with and without contrast is recommended for further characterization. Electronically signed by: Ryan Chess MD 08/16/2024 09:41 AM EDT RP Workstation: [id number]     PHYSICAL EXAM  Temp:  [98.2 F (36.8 C)-100.6 F (38.1 C)] 99 F (37.2 C) (10/25 1640) Pulse Rate:  [58-89] 69 (10/25 1640) Resp:  [16-26] 20 (10/25 1640) BP: (99-141)/(60-93) 115/93 (10/25 1640) SpO2:  [90 %-99 %] 94 % (10/25 1640)  General - Well nourished, well developed, in no apparent distress.  Ophthalmologic - fundi not visualized due to noncooperation.  Cardiovascular - Regular rhythm and rate.  Mental Status -  Level of arousal and orientation to time, place, and person were intact. Language including expression, naming, repetition, comprehension was assessed and found intact. Fund of Knowledge was assessed and was intact.  Cranial Nerves II - XII - II - Visual field intact OU. III, IV, VI - Extraocular movements intact. V - Facial sensation intact bilaterally. VII - Facial movement intact bilaterally. VIII - Hearing & vestibular intact bilaterally. X - Palate elevates symmetrically. XI - Chin turning & shoulder shrug intact bilaterally. XII - Tongue protrusion intact.  Motor Strength - The patient's strength was normal in all extremities and pronator drift was absent.  Bulk was normal and fasciculations were absent.   Motor Tone - Muscle tone was assessed at the neck and appendages and was normal.  Reflexes - The patient's reflexes were symmetrical in all extremities and he had no pathological reflexes.  Sensory - Light touch, temperature/pinprick were assessed and were symmetrical.    Coordination - The patient had normal movements in the hands and feet with no ataxia or dysmetria.  Tremor was absent.  Gait and  Station - deferred.   ASSESSMENT/PLAN Mr. CAYLOR TALLARICO is a 73 y.o. male with history of hypertension, hyperlipidemia, ICH in 2020, seizure on Keppra  admitted for several weeks of confusion and short-term memory deficit. No TNK given due to ICH.    Subacute ICH:  right parietal subacute ICH, etiology questionable for CAA CT right parietal edema with local mass effect CT head and neck unremarkable MRI right parietal subacute ICH, chronic left occipital ICH, old right cerebellar infarct 2D Echo EF 55 to 60% LDL 94 HgbA1c 5.1 SCDs for VTE prophylaxis No antithrombotic prior to admission, now on No antithrombotic due to ICH Therapy recommendations: Pending Disposition: Pending  History of seizure and ICH 06/2019 admitted for left parieto-occipital ICH, CTA head and neck unremarkable.  LDL 95, A1c 5.2.  UDS negative.  Had a seizure like activity, put on Keppra . 06/2022 admitted for focal status due to off Keppra .  EEG negative.  Keppra  was resumed  ? CAA Recurrent cortical ICH BP stable at home Family hx of dementia (mom) MRI showed b/l hemosiderosis but can not rule out from ICH blood redistribution. Strict BP management, no antithrombotics, no statin for now.  Hypertension BP stable Home meds including amlodipine  2.5 and metoprolol  25 twice daily Now home meds Long term BP goal normotensive  Hyperlipidemia Home meds: None LDL 94, goal < 70 No statin recommended at this time given possible CAA  Other Stroke Risk Factors Advanced age  Other Active Problems   Hospital day # 0  Neurology will  sign off. Please call with questions. Pt will follow up with stroke clinic NP Jessica at [facility] on 08/20/2024. Thanks for the consult.   Ary Cummins, MD PhD Stroke Neurology 08/17/2024 5:00 PM    To contact Stroke Continuity provider, please refer to [email or web address]. After hours, contact General Neurology

## 2024-08-17 NOTE — Progress Notes (Signed)
 Routine EEG completed, results pending Neurology review and interpretation

## 2024-08-18 DIAGNOSIS — R569 Unspecified convulsions: Secondary | ICD-10-CM | POA: Diagnosis not present

## 2024-08-18 DIAGNOSIS — I10 Essential (primary) hypertension: Secondary | ICD-10-CM | POA: Diagnosis not present

## 2024-08-18 DIAGNOSIS — E785 Hyperlipidemia, unspecified: Secondary | ICD-10-CM | POA: Diagnosis not present

## 2024-08-18 DIAGNOSIS — I619 Nontraumatic intracerebral hemorrhage, unspecified: Secondary | ICD-10-CM | POA: Diagnosis not present

## 2024-08-18 NOTE — Procedures (Signed)
 Routine EEG Report  Thomas Norman is a 73 y.o. male with a history of seizure who is undergoing an EEG to evaluate for seizures.  Report: This EEG was acquired with electrodes placed according to the International 10-20 electrode system (including Fp1, Fp2, F3, F4, C3, C4, P3, P4, O1, O2, T3, T4, T5, T6, A1, A2, Fz, Cz, Pz). The following electrodes were missing or displaced: none.  The occipital dominant rhythm was 7-8 Hz. This activity is reactive to stimulation. Drowsiness was manifested by background fragmentation; deeper stages of sleep were identified by K complexes and sleep spindles. There was focal slowing over the right hemisphere. There were no interictal epileptiform discharges. There were no electrographic seizures identified. Photic stimulation and hyperventilation were not performed.  Impression and clinical correlation: This EEG was obtained while awake and asleep and is abnormal due to: - mild diffuse slowing indicative of global cerebral dysfunction - focal slowing over the right hemisphere in the region of his known subacute ICH  Epileptiform abnormalities were not seen during this recording.  Elida Ross, MD [facility] [phone]  If 7pm- 7am, please page neurology on call as listed in AMION.

## 2024-08-18 NOTE — Discharge Summary (Addendum)
 PATIENT DETAILS Name: Thomas Norman Age: 73 y.o. Sex: male Date of Birth: December 28, 1950 MRN: [id number]. Admitting Physician: [name], MD ERE:Zypwhzm, Lamar, MD (Inactive)  Admit Date: 08/16/2024 Discharge date: 08/18/2024  Recommendations for Outpatient Follow-up:  Follow up with PCP in 1-2 weeks Please obtain CMP/CBC in one week Please ensure follow up with neurology. Outpatient follow-up with cardiology-echo with bubble study positive.  Admitted From:  Home  Disposition: Home   Discharge Condition: good  CODE STATUS:   Code Status: Full Code   Diet recommendation:  Diet Order             Diet - low sodium heart healthy           Diet regular Room service appropriate? Yes; Fluid consistency: Thin  Diet effective now                    Brief Summary: Patient is a 73 y.o.  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   Significant events: 10/24>> admit to [facility]   Significant studies: 10/24>> CT head: Parietal lobe mass-surrounding vasogenic edema/mass effect-likely subacute intraparenchymal hematoma. 10/24>> MRI brain: 3.8 x 2.6 x 3.4 centimeter inferior right parietal lobe lesion-suggestive of subacute parenchymal hematoma. 10/25>> CT angio head/neck: No significant abnormalities. 10/25>> EEG: No seizures. 10/25>> echo: EF 55-60%, agitated saline contrast bubble study positive.   Significant microbiology data: None   Procedures: None   Consults: Neurology  Brief Hospital Course: Intraparenchymal hemorrhage Unclear etiology-does not appear hypertensive-prior workup including catheter angiogram at [facility] was negative for AVM/aneurysm.  No history of trauma.  Not on  antiplatelet/anticoagulation. Per spouse-confusion is most related to mathematically issues-and seems reasonably well on my exam yesterday and again this morning. Nonfocal exam CTA head/neck without any AVM/aneurysm Echo stable but positive bubble study  No further recommendations from stroke MD-stable for discharge after evaluation by PT/OT today.  History of seizures Continue Keppra  EEG stable.   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  Get Medicines reviewed and adjusted: Please take all your medications with you for your next visit with your Primary MD  Laboratory/radiological data: Please request your Primary MD to go over all hospital tests and procedure/radiological results at the follow up, please ask your Primary MD to get all Hospital records sent to his/her office.  In some cases, they will be blood work, cultures and biopsy results pending at the time of your discharge. Please request that your primary care M.D. follows up on these results.  Also Note the following: If you experience worsening of your admission symptoms, develop shortness of breath, life threatening emergency, suicidal  or homicidal thoughts you must seek medical attention immediately by calling 911 or calling your MD immediately  if symptoms less  severe.  You must read complete instructions/literature along with all the possible adverse reactions/side effects for all the Medicines you take and that have been prescribed to you. Take any new Medicines after you have completely understood and accpet all the possible adverse reactions/side effects.   Do not drive when taking Pain medications or sleeping medications (Benzodaizepines)  Do not take more than prescribed Pain, Sleep and Anxiety Medications. It is not advisable to combine anxiety,sleep and pain medications without talking with your primary care practitioner  Special Instructions: If you have smoked or chewed Tobacco  in the last 2 yrs please stop smoking, stop any regular Alcohol  and or any Recreational drug use.  Wear Seat belts while driving.  Please note: You were cared for by a hospitalist during your hospital stay. Once you are discharged, your primary care physician will handle any further medical issues. Please note that NO REFILLS for any discharge medications will be authorized once you are discharged, as it is imperative that you return to your primary care physician (or establish a relationship with a primary care physician if you do not have one) for your post hospital discharge needs so that they can reassess your need for medications and monitor your lab values.   Increase activity slowly   Complete by: As directed       Allergies as of 08/18/2024       Reactions   Ace Inhibitors Other (See Comments)   unknown   Sulfonamide Derivatives Rash        Medication List     TAKE these medications    amLODipine  2.5 MG tablet Commonly known as: NORVASC  Take 1 tablet (2.5 mg total) by mouth daily.   CO Q 10 PO Take 1 tablet by mouth daily.   Fish Oil 1000 MG Caps Take 1 capsule by mouth See admin instructions. Take one tablet by mouth on Monday, Wednesdays and Fridays only per patiebt   levETIRAcetam  500 MG tablet Commonly known as: KEPPRA  Take 1  tablet (500 mg total) by mouth 2 (two) times daily.   metoprolol  tartrate 25 MG tablet Commonly known as: LOPRESSOR  Take 1 tablet (25 mg total) by mouth 2 (two) times daily.   VITAMIN B-12 PO Take 1 tablet by mouth daily.   VITAMIN D3 COMPLETE PO Take 1 tablet by mouth daily.        Follow-up Information     Whitfield Raisin, NP. Go on 08/20/2024.   Specialty: Neurology Why: stroke clinic Contact information: [address] [place] [address] [phone]         Hugh Charleston, MD. Schedule an appointment as soon as possible for a visit in 1 week(s).   Specialty: Family Medicine Contact information: [address] [place] [address] [phone]                Allergies  Allergen Reactions   Ace Inhibitors Other (See Comments)    unknown   Sulfonamide Derivatives Rash     Other Procedures/Studies: EEG adult Result Date: 08/17/2024 Matthews Elida HERO, MD     08/18/2024  8:33 AM Routine EEG Report MAR WALMER is a 74 y.o. male with a history of seizure who is undergoing an EEG to evaluate for seizures. Report: This EEG was acquired with electrodes placed according to the International 10-20 electrode system (including  Fp1, Fp2, F3, F4, C3, C4, P3, P4, O1, O2, T3, T4, T5, T6, A1, A2, Fz, Cz, Pz). The following electrodes were missing or displaced: none. The occipital dominant rhythm was 7-8 Hz. This activity is reactive to stimulation. Drowsiness was manifested by background fragmentation; deeper stages of sleep were identified by K complexes and sleep spindles. There was focal slowing over the right hemisphere. There were no interictal epileptiform discharges. There were no electrographic seizures identified. Photic stimulation and hyperventilation were not performed. Impression and clinical correlation: This EEG was obtained while awake and asleep and is abnormal due to: - mild diffuse slowing indicative of global cerebral dysfunction -  focal slowing over the right hemisphere in the region of his known subacute ICH Epileptiform abnormalities were not seen during this recording. Elida Ross, MD [facility] [phone] If 7pm- 7am, please page neurology on call as listed in AMION.   CT ANGIO HEAD NECK W WO CM Result Date: 08/17/2024 EXAM: CTA HEAD AND NECK WITH AND WITHOUT 08/17/2024 01:32:20 PM TECHNIQUE: CTA of the head and neck was performed with and without the administration of 75 mL of intravenous iohexol  (OMNIPAQUE ) 350 MG/ML injection. Multiplanar 2D and/or 3D reformatted images are provided for review. Automated exposure control, iterative reconstruction, and/or weight based adjustment of the mA/kV was utilized to reduce the radiation dose to as low as reasonably achievable. Stenosis of the internal carotid arteries measured using NASCET criteria. COMPARISON: Prior study. CLINICAL HISTORY: Stroke, hemorrhagic. Chief complaints; Neurologic Problem; Altered Mental Status. FINDINGS: CTA NECK: AORTIC ARCH AND ARCH VESSELS: No dissection or arterial injury. No significant stenosis of the brachiocephalic or subclavian arteries. CERVICAL CAROTID ARTERIES: No dissection, arterial injury, or hemodynamically significant stenosis by NASCET criteria. CERVICAL VERTEBRAL ARTERIES: No dissection, arterial injury, or significant stenosis. LUNGS AND MEDIASTINUM: Unremarkable. SOFT TISSUES: No acute abnormality. BONES: No acute abnormality. CTA HEAD: BRAIN: The right parietal hemorrhage is stable in size. Strain of vasogenic edema is similar to prior study. No new hemorrhage is present. Chronic white matter changes are stable bilaterally. ORBITS: Bilateral lens replacements are noted. The globes and orbits are otherwise within normal limits. ANTERIOR CIRCULATION: No significant stenosis of the internal carotid arteries. No significant stenosis of the anterior cerebral arteries. No significant stenosis of the middle cerebral arteries. No  aneurysm. POSTERIOR CIRCULATION: No significant stenosis of the posterior cerebral arteries. No significant stenosis of the basilar artery. No significant stenosis of the vertebral arteries. No aneurysm. OTHER: No dural venous sinus thrombosis on this non-dedicated study. IMPRESSION: 1. Stable right parietal hemorrhage with similar vasogenic edema compared to prior study. No new hemorrhage. 2. Stable chronic white matter changes bilaterally. Electronically signed by: Lonni Necessary MD 08/17/2024 03:32 PM EDT RP Workstation: [id number]   ECHOCARDIOGRAM COMPLETE BUBBLE STUDY Result Date: 08/17/2024    ECHOCARDIOGRAM REPORT   Patient Name:   GIANNY SABINO Date of Exam: 08/17/2024 Medical Rec #:  [id number]       Height:       70.0 in Accession #:    [id number]      Weight:       161.0 lb Date of Birth:  [birth year]       BSA:          1.903 m Patient Age:    73 years        BP:           111/71 mmHg Patient Gender: M  HR:           70 bpm. Exam Location:  Inpatient Procedure: 2D Echo, Color Doppler, Cardiac Doppler and Saline Contrast Bubble            Study (Both Spectral and Color Flow Doppler were utilized during            procedure). Indications:    Stroke i63.9  History:        Patient has no prior history of Echocardiogram examinations.                 Risk Factors:Hypertension and Dyslipidemia.  Sonographer:    Damien Senior RDCS Referring Phys: [id number] MARSA NOVAK MELVIN IMPRESSIONS  1. Left ventricular ejection fraction, by estimation, is 55 to 60%. The left ventricle has normal function. The left ventricle has no regional wall motion abnormalities. Left ventricular diastolic parameters were normal.  2. Right ventricular systolic function is normal. The right ventricular size is normal. Tricuspid regurgitation signal is inadequate for assessing PA pressure.  3. The mitral valve is normal in structure. Trivial mitral valve regurgitation. No evidence of mitral stenosis.  4. The aortic valve  is tricuspid. Aortic valve regurgitation is not visualized. No aortic stenosis is present.  5. The inferior vena cava is normal in size with greater than 50% respiratory variability, suggesting right atrial pressure of 3 mmHg.  6. Agitated saline contrast bubble study was positive with shunting observed after >6 cardiac cycles suggestive of intrapulmonary shunting. Conclusion(s)/Recommendation(s): No intracardiac source of embolism detected on this transthoracic study. Consider a transesophageal echocardiogram to exclude cardiac source of embolism if clinically indicated. FINDINGS  Left Ventricle: Left ventricular ejection fraction, by estimation, is 55 to 60%. The left ventricle has normal function. The left ventricle has no regional wall motion abnormalities. The left ventricular internal cavity size was normal in size. There is  no left ventricular hypertrophy. Left ventricular diastolic parameters were normal. Right Ventricle: The right ventricular size is normal. No increase in right ventricular wall thickness. Right ventricular systolic function is normal. Tricuspid regurgitation signal is inadequate for assessing PA pressure. Left Atrium: Left atrial size was normal in size. Right Atrium: Right atrial size was normal in size. Pericardium: There is no evidence of pericardial effusion. Presence of epicardial fat layer. Mitral Valve: The mitral valve is normal in structure. Trivial mitral valve regurgitation. No evidence of mitral valve stenosis. Tricuspid Valve: The tricuspid valve is normal in structure. Tricuspid valve regurgitation is trivial. Aortic Valve: The aortic valve is tricuspid. Aortic valve regurgitation is not visualized. No aortic stenosis is present. Pulmonic Valve: The pulmonic valve was grossly normal. Pulmonic valve regurgitation is trivial. No evidence of pulmonic stenosis. Aorta: The aortic root and ascending aorta are structurally normal, with no evidence of dilitation. Venous: The  inferior vena cava is normal in size with greater than 50% respiratory variability, suggesting right atrial pressure of 3 mmHg. IAS/Shunts: No atrial level shunt detected by color flow Doppler. Agitated saline contrast was given intravenously to evaluate for intracardiac shunting. Agitated saline contrast bubble study was positive with shunting observed after >6 cardiac cycles suggestive of intrapulmonary shunting.  LEFT VENTRICLE PLAX 2D LVIDd:         4.52 cm   Diastology LVIDs:         2.71 cm   LV e' medial:    7.51 cm/s LV PW:         1.06 cm   LV E/e' medial:  6.4 LV IVS:  1.02 cm   LV e' lateral:   11.10 cm/s LVOT diam:     2.05 cm   LV E/e' lateral: 4.3 LV SV:         57 LV SV Index:   30 LVOT Area:     3.30 cm LV IVRT:       111 msec  RIGHT VENTRICLE RV S prime:     21.10 cm/s  PULMONARY VEINS TAPSE (M-mode): 2.2 cm      Diastolic Velocity: 44.50 cm/s                             S/D Velocity:       1.40                             Systolic Velocity:  62.50 cm/s LEFT ATRIUM             Index        RIGHT ATRIUM           Index LA diam:        3.73 cm 1.96 cm/m   RA Area:     19.50 cm LA Vol (A2C):   50.4 ml 26.48 ml/m  RA Volume:   53.10 ml  27.90 ml/m LA Vol (A4C):   39.8 ml 20.91 ml/m LA Biplane Vol: 48.8 ml 25.64 ml/m  AORTIC VALVE LVOT Vmax:   82.20 cm/s LVOT Vmean:  62.500 cm/s LVOT VTI:    0.173 m  AORTA Ao Root diam: 3.26 cm Ao Asc diam:  3.38 cm MITRAL VALVE MV Area (PHT): 2.11 cm    SHUNTS MV Decel Time: 359 msec    Systemic VTI:  0.17 m MV E velocity: 48.20 cm/s  Systemic Diam: 2.05 cm MV A velocity: 55.50 cm/s MV E/A ratio:  0.87 Darryle Decent MD Electronically signed by Darryle Decent MD Signature Date/Time: 08/17/2024/12:17:18 PM    Final    MR Brain W and Wo Contrast Result Date: 08/16/2024 EXAM: MRI BRAIN WITH AND WITHOUT CONTRAST 08/16/2024 02:08:28 PM TECHNIQUE: Multiplanar multisequence MRI of the head/brain was performed with and without the administration of intravenous  contrast. COMPARISON: CT head and MRI head 07/01/2022. CLINICAL HISTORY: Mental status change, unknown cause. FINDINGS: BRAIN AND VENTRICLES: There is a 3.8 x 2.6 x 3.4 cm region of signal abnormality in the inferior right parietal lobe corresponding to the region of suspected parenchymal hematoma noted on CT. This lesion demonstrates intrinsic T1 hyperintensity which is along the peripheral aspect with additional T2 and FLAIR hyperintensity. There is a rim of susceptibility along the outer margin. There is no evidence of enhancement within the lesion. Surrounding edema within the right parietal lobe extending into the posterior right temporal lobe with local mass effect and sulcal effacement. There is partial effacement of the posterior temporal horn and atrium of the right lateral ventricle. There is minimal 1 mm leftward midline shift demonstrated. The basilar cisterns are patent. Unremarkable appearance of the posterior fossa. Small remote infarct in the right cerebellum. No acute infarct. Scattered supratentorial white matter signal abnormality suggestive of mild chronic microvascular disease. Additional focus of encephalomalacia and susceptibility in the left parietal lobe suggestive of prior hemorrhagic infarct. There are multiple scattered areas of susceptibility over the bilateral sulci likely reflecting sequelae of prior subarachnoid hemorrhage. No evidence of intracranial enhancing lesion. The sella is unremarkable. Normal flow voids. No hydrocephalus. ORBITS:  Bilateral lens replacement. SINUSES: No acute abnormality. BONES AND SOFT TISSUES: Normal bone marrow signal and enhancement. No acute soft tissue abnormality. IMPRESSION: 1. 3.8 x 2.6 x 3.4 cm lesion in the inferior right parietal lobe suggestive of subacute parenchymal hematoma. No evidence of underlyign enhancing lesion. Consider MRI in 3 months to document complete resolution. 2. Surrounding edema, local mass effect, and partial ventricular  effacement. 3. No acute infarct. 4. Small remote right cerebellar infarct. 5. Left parietal encephalomalacia with susceptibility, compatible with prior hemorrhagic infarct. 6. Multiple scattered sulcal foci of susceptibility bilaterally, compatible with sequelae of prior subarachnoid hemorrhage. 7. Mild chronic microvascular ischemic changes. Electronically signed by: Donnice Mania MD 08/16/2024 02:36 PM EDT RP Workstation: [id number]   CT HEAD WO CONTRAST Result Date: 08/16/2024 EXAM: CT HEAD WITHOUT CONTRAST 08/16/2024 09:35:00 AM TECHNIQUE: CT of the head was performed without the administration of intravenous contrast. Automated exposure control, iterative reconstruction, and/or weight based adjustment of the mA/kV was utilized to reduce the radiation dose to as low as reasonably achievable. COMPARISON: CT head 06/30/2022. MRI brain 07/01/2022. CLINICAL HISTORY: Memory loss. FINDINGS: BRAIN AND VENTRICLES: No acute hemorrhage. Well circumscribed, ovoid hypoattenuating mass in the right inferior parietal lobe measuring up to 3.8 x 2.3 cm on axial image 17 series 3. Surrounding vasogenic edema and moderate mass effect on the atrium of the right lateral ventricle. Mild internal heterogeneity is suggestive of subacute hematoma. Neoplasm remains in the differential. Encephalomalacia from prior hemorrhage in the left parietal lobe. Background of moderate chronic small vessel disease. No evidence of acute infarct. No hydrocephalus. No extra-axial collection. No midline shift. ORBITS: No acute abnormality. SINUSES: No acute abnormality. SOFT TISSUES AND SKULL: No acute soft tissue abnormality. No skull fracture. IMPRESSION: 1. Well-circumscribed ovoid right inferior parietal lobe mass measuring up to 3.8 x 2.3 cm with surrounding vasogenic edema and moderate mass effect on the atrium of the right lateral ventricle, favored subacute intraparenchymal hematoma though neoplasm remains in the differential. MRI of the  brain with and without contrast is recommended for further characterization. Electronically signed by: Ryan Chess MD 08/16/2024 09:41 AM EDT RP Workstation: [id number]     TODAY-DAY OF DISCHARGE:  Subjective:   Lemond Finder today has no headache,no chest abdominal pain,no new weakness tingling or numbness, feels much better wants to go home today.   Objective:   Blood pressure 101/75, pulse 67, temperature 98.1 F (36.7 C), temperature source Oral, resp. rate 20, height 5' 10 (1.778 m), weight 73 kg, SpO2 95%.  Intake/Output Summary (Last 24 hours) at 08/18/2024 0846 Last data filed at 08/17/2024 2027 Gross per 24 hour  Intake 723 ml  Output --  Net 723 ml   Filed Weights   08/16/24 0924  Weight: 73 kg    Exam: Awake Alert, Oriented *3, No new F.N deficits, Normal affect [place].AT,PERRAL Supple Neck,No JVD, No cervical lymphadenopathy appriciated.  Symmetrical Chest wall movement, Good air movement bilaterally, CTAB RRR,No Gallops,Rubs or new Murmurs, No Parasternal Heave +ve B.Sounds, Abd Soft, Non tender, No organomegaly appriciated, No rebound -guarding or rigidity. No Cyanosis, Clubbing or edema, No new Rash or bruise   PERTINENT RADIOLOGIC STUDIES: EEG adult Result Date: 08/17/2024 Matthews Elida HERO, MD     08/18/2024  8:33 AM Routine EEG Report FAYSAL FENOGLIO is a 73 y.o. male with a history of seizure who is undergoing an EEG to evaluate for seizures. Report: This EEG was acquired with electrodes placed according to the International 10-20 electrode system (including Fp1,  Fp2, F3, F4, C3, C4, P3, P4, O1, O2, T3, T4, T5, T6, A1, A2, Fz, Cz, Pz). The following electrodes were missing or displaced: none. The occipital dominant rhythm was 7-8 Hz. This activity is reactive to stimulation. Drowsiness was manifested by background fragmentation; deeper stages of sleep were identified by K complexes and sleep spindles. There was focal slowing over the right hemisphere. There  were no interictal epileptiform discharges. There were no electrographic seizures identified. Photic stimulation and hyperventilation were not performed. Impression and clinical correlation: This EEG was obtained while awake and asleep and is abnormal due to: - mild diffuse slowing indicative of global cerebral dysfunction - focal slowing over the right hemisphere in the region of his known subacute ICH Epileptiform abnormalities were not seen during this recording. Elida Ross, MD [facility] [phone] If 7pm- 7am, please page neurology on call as listed in AMION.   CT ANGIO HEAD NECK W WO CM Result Date: 08/17/2024 EXAM: CTA HEAD AND NECK WITH AND WITHOUT 08/17/2024 01:32:20 PM TECHNIQUE: CTA of the head and neck was performed with and without the administration of 75 mL of intravenous iohexol  (OMNIPAQUE ) 350 MG/ML injection. Multiplanar 2D and/or 3D reformatted images are provided for review. Automated exposure control, iterative reconstruction, and/or weight based adjustment of the mA/kV was utilized to reduce the radiation dose to as low as reasonably achievable. Stenosis of the internal carotid arteries measured using NASCET criteria. COMPARISON: Prior study. CLINICAL HISTORY: Stroke, hemorrhagic. Chief complaints; Neurologic Problem; Altered Mental Status. FINDINGS: CTA NECK: AORTIC ARCH AND ARCH VESSELS: No dissection or arterial injury. No significant stenosis of the brachiocephalic or subclavian arteries. CERVICAL CAROTID ARTERIES: No dissection, arterial injury, or hemodynamically significant stenosis by NASCET criteria. CERVICAL VERTEBRAL ARTERIES: No dissection, arterial injury, or significant stenosis. LUNGS AND MEDIASTINUM: Unremarkable. SOFT TISSUES: No acute abnormality. BONES: No acute abnormality. CTA HEAD: BRAIN: The right parietal hemorrhage is stable in size. Strain of vasogenic edema is similar to prior study. No new hemorrhage is present. Chronic white matter changes are  stable bilaterally. ORBITS: Bilateral lens replacements are noted. The globes and orbits are otherwise within normal limits. ANTERIOR CIRCULATION: No significant stenosis of the internal carotid arteries. No significant stenosis of the anterior cerebral arteries. No significant stenosis of the middle cerebral arteries. No aneurysm. POSTERIOR CIRCULATION: No significant stenosis of the posterior cerebral arteries. No significant stenosis of the basilar artery. No significant stenosis of the vertebral arteries. No aneurysm. OTHER: No dural venous sinus thrombosis on this non-dedicated study. IMPRESSION: 1. Stable right parietal hemorrhage with similar vasogenic edema compared to prior study. No new hemorrhage. 2. Stable chronic white matter changes bilaterally. Electronically signed by: Lonni Necessary MD 08/17/2024 03:32 PM EDT RP Workstation: [id number]   ECHOCARDIOGRAM COMPLETE BUBBLE STUDY Result Date: 08/17/2024    ECHOCARDIOGRAM REPORT   Patient Name:   FRANKEY BOTTING Date of Exam: 08/17/2024 Medical Rec #:  [id number]       Height:       70.0 in Accession #:    [id number]      Weight:       161.0 lb Date of Birth:  08/26/1951       BSA:          1.903 m Patient Age:    73 years        BP:           111/71 mmHg Patient Gender: M  HR:           70 bpm. Exam Location:  Inpatient Procedure: 2D Echo, Color Doppler, Cardiac Doppler and Saline Contrast Bubble            Study (Both Spectral and Color Flow Doppler were utilized during            procedure). Indications:    Stroke i63.9  History:        Patient has no prior history of Echocardiogram examinations.                 Risk Factors:Hypertension and Dyslipidemia.  Sonographer:    Damien Senior RDCS Referring Phys: [id number] MARSA NOVAK MELVIN IMPRESSIONS  1. Left ventricular ejection fraction, by estimation, is 55 to 60%. The left ventricle has normal function. The left ventricle has no regional wall motion abnormalities. Left ventricular  diastolic parameters were normal.  2. Right ventricular systolic function is normal. The right ventricular size is normal. Tricuspid regurgitation signal is inadequate for assessing PA pressure.  3. The mitral valve is normal in structure. Trivial mitral valve regurgitation. No evidence of mitral stenosis.  4. The aortic valve is tricuspid. Aortic valve regurgitation is not visualized. No aortic stenosis is present.  5. The inferior vena cava is normal in size with greater than 50% respiratory variability, suggesting right atrial pressure of 3 mmHg.  6. Agitated saline contrast bubble study was positive with shunting observed after >6 cardiac cycles suggestive of intrapulmonary shunting. Conclusion(s)/Recommendation(s): No intracardiac source of embolism detected on this transthoracic study. Consider a transesophageal echocardiogram to exclude cardiac source of embolism if clinically indicated. FINDINGS  Left Ventricle: Left ventricular ejection fraction, by estimation, is 55 to 60%. The left ventricle has normal function. The left ventricle has no regional wall motion abnormalities. The left ventricular internal cavity size was normal in size. There is  no left ventricular hypertrophy. Left ventricular diastolic parameters were normal. Right Ventricle: The right ventricular size is normal. No increase in right ventricular wall thickness. Right ventricular systolic function is normal. Tricuspid regurgitation signal is inadequate for assessing PA pressure. Left Atrium: Left atrial size was normal in size. Right Atrium: Right atrial size was normal in size. Pericardium: There is no evidence of pericardial effusion. Presence of epicardial fat layer. Mitral Valve: The mitral valve is normal in structure. Trivial mitral valve regurgitation. No evidence of mitral valve stenosis. Tricuspid Valve: The tricuspid valve is normal in structure. Tricuspid valve regurgitation is trivial. Aortic Valve: The aortic valve is  tricuspid. Aortic valve regurgitation is not visualized. No aortic stenosis is present. Pulmonic Valve: The pulmonic valve was grossly normal. Pulmonic valve regurgitation is trivial. No evidence of pulmonic stenosis. Aorta: The aortic root and ascending aorta are structurally normal, with no evidence of dilitation. Venous: The inferior vena cava is normal in size with greater than 50% respiratory variability, suggesting right atrial pressure of 3 mmHg. IAS/Shunts: No atrial level shunt detected by color flow Doppler. Agitated saline contrast was given intravenously to evaluate for intracardiac shunting. Agitated saline contrast bubble study was positive with shunting observed after >6 cardiac cycles suggestive of intrapulmonary shunting.  LEFT VENTRICLE PLAX 2D LVIDd:         4.52 cm   Diastology LVIDs:         2.71 cm   LV e' medial:    7.51 cm/s LV PW:         1.06 cm   LV E/e' medial:  6.4 LV IVS:  1.02 cm   LV e' lateral:   11.10 cm/s LVOT diam:     2.05 cm   LV E/e' lateral: 4.3 LV SV:         57 LV SV Index:   30 LVOT Area:     3.30 cm LV IVRT:       111 msec  RIGHT VENTRICLE RV S prime:     21.10 cm/s  PULMONARY VEINS TAPSE (M-mode): 2.2 cm      Diastolic Velocity: 44.50 cm/s                             S/D Velocity:       1.40                             Systolic Velocity:  62.50 cm/s LEFT ATRIUM             Index        RIGHT ATRIUM           Index LA diam:        3.73 cm 1.96 cm/m   RA Area:     19.50 cm LA Vol (A2C):   50.4 ml 26.48 ml/m  RA Volume:   53.10 ml  27.90 ml/m LA Vol (A4C):   39.8 ml 20.91 ml/m LA Biplane Vol: 48.8 ml 25.64 ml/m  AORTIC VALVE LVOT Vmax:   82.20 cm/s LVOT Vmean:  62.500 cm/s LVOT VTI:    0.173 m  AORTA Ao Root diam: 3.26 cm Ao Asc diam:  3.38 cm MITRAL VALVE MV Area (PHT): 2.11 cm    SHUNTS MV Decel Time: 359 msec    Systemic VTI:  0.17 m MV E velocity: 48.20 cm/s  Systemic Diam: 2.05 cm MV A velocity: 55.50 cm/s MV E/A ratio:  0.87 Darryle Decent MD  Electronically signed by Darryle Decent MD Signature Date/Time: 08/17/2024/12:17:18 PM    Final    MR Brain W and Wo Contrast Result Date: 08/16/2024 EXAM: MRI BRAIN WITH AND WITHOUT CONTRAST 08/16/2024 02:08:28 PM TECHNIQUE: Multiplanar multisequence MRI of the head/brain was performed with and without the administration of intravenous contrast. COMPARISON: CT head and MRI head 07/01/2022. CLINICAL HISTORY: Mental status change, unknown cause. FINDINGS: BRAIN AND VENTRICLES: There is a 3.8 x 2.6 x 3.4 cm region of signal abnormality in the inferior right parietal lobe corresponding to the region of suspected parenchymal hematoma noted on CT. This lesion demonstrates intrinsic T1 hyperintensity which is along the peripheral aspect with additional T2 and FLAIR hyperintensity. There is a rim of susceptibility along the outer margin. There is no evidence of enhancement within the lesion. Surrounding edema within the right parietal lobe extending into the posterior right temporal lobe with local mass effect and sulcal effacement. There is partial effacement of the posterior temporal horn and atrium of the right lateral ventricle. There is minimal 1 mm leftward midline shift demonstrated. The basilar cisterns are patent. Unremarkable appearance of the posterior fossa. Small remote infarct in the right cerebellum. No acute infarct. Scattered supratentorial white matter signal abnormality suggestive of mild chronic microvascular disease. Additional focus of encephalomalacia and susceptibility in the left parietal lobe suggestive of prior hemorrhagic infarct. There are multiple scattered areas of susceptibility over the bilateral sulci likely reflecting sequelae of prior subarachnoid hemorrhage. No evidence of intracranial enhancing lesion. The sella is unremarkable. Normal flow voids. No hydrocephalus. ORBITS:  Bilateral lens replacement. SINUSES: No acute abnormality. BONES AND SOFT TISSUES: Normal bone marrow signal  and enhancement. No acute soft tissue abnormality. IMPRESSION: 1. 3.8 x 2.6 x 3.4 cm lesion in the inferior right parietal lobe suggestive of subacute parenchymal hematoma. No evidence of underlyign enhancing lesion. Consider MRI in 3 months to document complete resolution. 2. Surrounding edema, local mass effect, and partial ventricular effacement. 3. No acute infarct. 4. Small remote right cerebellar infarct. 5. Left parietal encephalomalacia with susceptibility, compatible with prior hemorrhagic infarct. 6. Multiple scattered sulcal foci of susceptibility bilaterally, compatible with sequelae of prior subarachnoid hemorrhage. 7. Mild chronic microvascular ischemic changes. Electronically signed by: Donnice Mania MD 08/16/2024 02:36 PM EDT RP Workstation: [id number]   CT HEAD WO CONTRAST Result Date: 08/16/2024 EXAM: CT HEAD WITHOUT CONTRAST 08/16/2024 09:35:00 AM TECHNIQUE: CT of the head was performed without the administration of intravenous contrast. Automated exposure control, iterative reconstruction, and/or weight based adjustment of the mA/kV was utilized to reduce the radiation dose to as low as reasonably achievable. COMPARISON: CT head 06/30/2022. MRI brain 07/01/2022. CLINICAL HISTORY: Memory loss. FINDINGS: BRAIN AND VENTRICLES: No acute hemorrhage. Well circumscribed, ovoid hypoattenuating mass in the right inferior parietal lobe measuring up to 3.8 x 2.3 cm on axial image 17 series 3. Surrounding vasogenic edema and moderate mass effect on the atrium of the right lateral ventricle. Mild internal heterogeneity is suggestive of subacute hematoma. Neoplasm remains in the differential. Encephalomalacia from prior hemorrhage in the left parietal lobe. Background of moderate chronic small vessel disease. No evidence of acute infarct. No hydrocephalus. No extra-axial collection. No midline shift. ORBITS: No acute abnormality. SINUSES: No acute abnormality. SOFT TISSUES AND SKULL: No acute soft tissue  abnormality. No skull fracture. IMPRESSION: 1. Well-circumscribed ovoid right inferior parietal lobe mass measuring up to 3.8 x 2.3 cm with surrounding vasogenic edema and moderate mass effect on the atrium of the right lateral ventricle, favored subacute intraparenchymal hematoma though neoplasm remains in the differential. MRI of the brain with and without contrast is recommended for further characterization. Electronically signed by: Ryan Chess MD 08/16/2024 09:41 AM EDT RP Workstation: [id number]     PERTINENT LAB RESULTS: CBC: Recent Labs    08/16/24 0935 08/16/24 0947 08/17/24 0424  WBC 5.4  --  4.7  HGB 14.8 14.6 13.8  HCT 44.5 43.0 40.2  PLT 189  --  163   CMET CMP     Component Value Date/Time   NA 135 08/17/2024 0424   K 4.0 08/17/2024 0424   CL 100 08/17/2024 0424   CO2 24 08/17/2024 0424   GLUCOSE 94 08/17/2024 0424   BUN 14 08/17/2024 0424   CREATININE 1.27 (H) 08/17/2024 0424   CALCIUM 8.6 (L) 08/17/2024 0424   PROT 6.2 (L) 08/17/2024 0424   ALBUMIN 3.5 08/17/2024 0424   AST 20 08/17/2024 0424   ALT 17 08/17/2024 0424   ALKPHOS 59 08/17/2024 0424   BILITOT 0.7 08/17/2024 0424   GFRNONAA >60 08/17/2024 0424    GFR Estimated Creatinine Clearance: 55.1 mL/min (A) (by C-G formula based on SCr of 1.27 mg/dL (H)). No results for input(s): LIPASE, AMYLASE in the last 72 hours. No results for input(s): CKTOTAL, CKMB, CKMBINDEX, TROPONINI in the last 72 hours. Invalid input(s): POCBNP No results for input(s): DDIMER in the last 72 hours. Recent Labs    08/16/24 2217  HGBA1C 5.1   Recent Labs    08/17/24 0424  CHOL 150  HDL 45  LDLCALC 94  TRIG  55  CHOLHDL 3.3   Recent Labs    08/17/24 0424  TSH 0.518   Recent Labs    08/17/24 0424  VITAMINB12 182  FOLATE 13.7   Coags: Recent Labs    08/16/24 0935  INR 1.1   Microbiology: Recent Results (from the past 240 hours)  MRSA Next Gen by PCR, Nasal     Status: None    Collection Time: 08/16/24  8:48 PM   Specimen: Nasal Mucosa; Nasal Swab  Result Value Ref Range Status   MRSA by PCR Next Gen NOT DETECTED NOT DETECTED Final    Comment: (NOTE) The GeneXpert MRSA Assay (FDA approved for NASAL specimens only), is one component of a comprehensive MRSA colonization surveillance program. It is not intended to diagnose MRSA infection nor to guide or monitor treatment for MRSA infections. Test performance is not FDA approved in patients less than 32 years old. Performed at [facility], [address] [place], [place] [address]     FURTHER DISCHARGE INSTRUCTIONS:  Get Medicines reviewed and adjusted: Please take all your medications with you for your next visit with your Primary MD  Laboratory/radiological data: Please request your Primary MD to go over all hospital tests and procedure/radiological results at the follow up, please ask your Primary MD to get all Hospital records sent to his/her office.  In some cases, they will be blood work, cultures and biopsy results pending at the time of your discharge. Please request that your primary care M.D. goes through all the records of your hospital data and follows up on these results.  Also Note the following: If you experience worsening of your admission symptoms, develop shortness of breath, life threatening emergency, suicidal or homicidal thoughts you must seek medical attention immediately by calling 911 or calling your MD immediately  if symptoms less severe.  You must read complete instructions/literature along with all the possible adverse reactions/side effects for all the Medicines you take and that have been prescribed to you. Take any new Medicines after you have completely understood and accpet all the possible adverse reactions/side effects.   Do not drive when taking Pain medications or sleeping medications (Benzodaizepines)  Do not take more than prescribed Pain, Sleep and Anxiety  Medications. It is not advisable to combine anxiety,sleep and pain medications without talking with your primary care practitioner  Special Instructions: If you have smoked or chewed Tobacco  in the last 2 yrs please stop smoking, stop any regular Alcohol  and or any Recreational drug use.  Wear Seat belts while driving.  Please note: You were cared for by a hospitalist during your hospital stay. Once you are discharged, your primary care physician will handle any further medical issues. Please note that NO REFILLS for any discharge medications will be authorized once you are discharged, as it is imperative that you return to your primary care physician (or establish a relationship with a primary care physician if you do not have one) for your post hospital discharge needs so that they can reassess your need for medications and monitor your lab values.  Total Time spent coordinating discharge including counseling, education and face to face time equals greater than 30 minutes.  SignedBETHA Donalda Applebaum 08/18/2024 8:46 AM

## 2024-08-18 NOTE — Progress Notes (Signed)
 AVS completed for discharge packet and placed with chart.

## 2024-08-18 NOTE — Evaluation (Signed)
 Physical Therapy Evaluation and Discharge Patient Details Name: Thomas Norman MRN: [id number] DOB: 1951-01-04 Today's Date: 08/18/2024  History of Present Illness  73 y.o. male admitted for several weeks of confusion and short-term memory deficit. 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 All education has been completed and the patient has no further questions. Recommend Outpt Speech Therapy evaluation; See below for any follow-up Physical Therapy or equipment needs. PT is signing off. Thank you for this referral.         If plan is discharge home, recommend the following: Assist for transportation;Supervision due to cognitive status   Can travel by private vehicle        Equipment Recommendations None recommended by PT  Recommendations for Other Services  Other (comment) (Recommend Outpt Speech Language Pathology Consult for more detailed cognitive evaluation)    Functional Status Assessment Patient has not had a recent decline in their functional status     Precautions / Restrictions Restrictions Weight Bearing Restrictions Per Provider Order: No      Mobility  Bed Mobility                    Transfers Overall transfer level: Independent                      Ambulation/Gait Ambulation/Gait assistance: Independent Gait Distance (Feet): 300 Feet Assistive device: None Gait Pattern/deviations: WFL(Within Functional Limits) Gait velocity: WNL     General Gait Details: No difficulty  Stairs Stairs: Yes Stairs assistance: Modified independent (Device/Increase time) Stair Management: No rails, Alternating pattern, Forwards (Occasional stpe-to pattern with descending) Number of Stairs: 12 General stair comments: No difficulty;  noting incr time to descend, approaptiate for teh situation  Wheelchair Mobility     Tilt Bed    Modified Rankin (Stroke Patients Only)       Balance Overall balance assessment: Mild deficits observed, not formally tested                                           Pertinent Vitals/Pain Pain Assessment Pain Assessment: No/denies pain    Home Living Family/patient expects to be discharged to:: Private residence Living Arrangements: Spouse/significant other Available Help at Discharge: Family;Available 24 hours/day Type of Home: House Home Access: Stairs to enter   [employer] of Steps: 2   Home Layout: One level Home Equipment: [employer] (2 wheels)      Prior Function Prior Level of Function : Independent/Modified Independent;Driving             Mobility Comments: Retired, independent and active       Extremity/Trunk Assessment   Upper Extremity Assessment Upper Extremity Assessment: Defer to OT evaluation;Overall [facility] for tasks assessed    Lower Extremity Assessment Lower Extremity Assessment: Overall WFL for tasks assessed    Cervical / Trunk Assessment Cervical / Trunk Assessment: Normal  Communication   Communication Communication: No apparent difficulties    Cognition Arousal: Alert Behavior During Therapy: WFL for tasks assessed/performed   PT - Cognitive impairments: Other  PT - Cognition Comments: Noting difficulty with pathfinding back to his room; with incr time, and intentional pausing, he read room numbers to get back to his room; Able to remember his room number, 04 Following commands: Intact       Cueing Cueing Techniques: Verbal cues     General Comments General comments (skin integrity, edema, etc.): Vitals taken standing: BP 117/77, MAP 85, HR 89    Exercises     Assessment/Plan    PT Assessment Patient does not need any further PT services  PT Problem  List         PT Treatment Interventions      PT Goals (Current goals can be found in the Care Plan section)  Acute Rehab PT Goals Patient Stated Goal: Hopes to get home soon; back to normal soon PT Goal Formulation: All assessment and education complete, DC therapy    Frequency       Co-evaluation               AM-PAC PT 6 Clicks Mobility  Outcome Measure Help needed turning from your back to your side while in a flat bed without using bedrails?: None Help needed moving from lying on your back to sitting on the side of a flat bed without using bedrails?: None Help needed moving to and from a bed to a chair (including a wheelchair)?: None Help needed standing up from a chair using your arms (e.g., wheelchair or bedside chair)?: None Help needed to walk in hospital room?: None Help needed climbing 3-5 steps with a railing? : None 6 Click Score: 24    End of Session   Activity Tolerance: Patient tolerated treatment well Patient left: in chair;with call bell/phone within reach Nurse Communication: Mobility status PT Visit Diagnosis: Other symptoms and signs involving the nervous system (R29.898)    Time: 9140-9078 PT Time Calculation (min) (ACUTE ONLY): 22 min   Charges:   PT Evaluation $PT Eval Low Complexity: 1 Low   PT General Charges $$ ACUTE PT VISIT: 1 Visit         Silvano Currier, PT  Acute Rehabilitation Services Office [phone] Secure Chat welcomed   Silvano VEAR Currier 08/18/2024, 9:46 AM

## 2024-08-18 NOTE — Evaluation (Signed)
 Occupational Therapy Evaluation Patient Details Name: Thomas Norman MRN: [id number] DOB: 12-23-1950 Today's Date: 08/18/2024   History of Present Illness   73 y.o. male with history of hypertension, hyperlipidemia, ICH in 2020, seizure on Keppra  admitted for several weeks of confusion and short-term memory deficit. No TNK given due to ICH. Subacute ICH:  right parietal subacute ICH.     Clinical Impressions Patient admitted for the diagnosis above.  PTA he lives at home with his spouse, who can assist as needed.  No significant deficits noted, and no formal OT needed in the acute setting or post acute.       If plan is discharge home, recommend the following:   Assist for transportation     Functional Status Assessment   Patient has not had a recent decline in their functional status     Equipment Recommendations   None recommended by OT     Recommendations for Other Services         Precautions/Restrictions   Precautions Precautions: None Restrictions Weight Bearing Restrictions Per Provider Order: No     Mobility Bed Mobility                    Transfers Overall transfer level: Independent                 General transfer comment: Mild unsteadiness noted      Balance Overall balance assessment: Mild deficits observed, not formally tested                                         ADL either performed or assessed with clinical judgement   ADL Overall ADL's : At baseline                                             Vision Baseline Vision/History: 1 Wears glasses Patient Visual Report: No change from baseline       Perception Perception: Within Functional Limits       Praxis Praxis: WFL       Pertinent Vitals/Pain Pain Assessment Pain Assessment: No/denies pain     Extremity/Trunk Assessment Upper Extremity Assessment Upper Extremity Assessment: Overall WFL for tasks assessed    Lower Extremity Assessment Lower Extremity Assessment: Defer to PT evaluation   Cervical / Trunk Assessment Cervical / Trunk Assessment: Normal   Communication Communication Communication: No apparent difficulties   Cognition Arousal: Alert Behavior During Therapy: WFL for tasks assessed/performed Cognition: No apparent impairments             OT - Cognition Comments: Mild word finding and did lose train of thought, but patient stating these are all improving and close to baseline.                 Following commands: Intact       Cueing  General Comments   Cueing Techniques: Verbal cues      Exercises     Shoulder Instructions      Home Living Family/patient expects to be discharged to:: Private residence Living Arrangements: Spouse/significant other Available Help at Discharge: Family;Available 24 hours/day Type of Home: House Home Access: Stairs to enter [employer] of Steps: 2   Home Layout: One level     Bathroom Shower/Tub:  Walk-in shower   Bathroom Toilet: Standard     Home Equipment: [employer] (2 wheels)          Prior Functioning/Environment Prior Level of Function : Independent/Modified Independent;Driving                    OT Problem List: Impaired balance (sitting and/or standing)   OT Treatment/Interventions:        OT Goals(Current goals can be found in the care plan section)   Acute Rehab OT Goals Patient Stated Goal: Hoping to return home today OT Goal Formulation: With patient Time For Goal Achievement: 08/22/24 Potential to Achieve Goals: Good   OT Frequency:       Co-evaluation              AM-PAC OT 6 Clicks Daily Activity     Outcome Measure Help from another person eating meals?: None Help from another person taking care of personal grooming?: None Help from another person toileting, which includes using toliet, bedpan, or urinal?: None Help from another person bathing  (including washing, rinsing, drying)?: None Help from another person to put on and taking off regular upper body clothing?: None Help from another person to put on and taking off regular lower body clothing?: None 6 Click Score: 24   End of Session Nurse Communication: Mobility status  Activity Tolerance: Patient tolerated treatment well Patient left: in chair;with call bell/phone within reach  OT Visit Diagnosis: Unsteadiness on feet (R26.81)                Time: 9198-9179 OT Time Calculation (min): 19 min Charges:  OT General Charges $OT Visit: 1 Visit OT Evaluation $OT Eval Moderate Complexity: 1 Mod  08/18/2024  RP, OTR/L  Acute Rehabilitation Services  Office:  [phone]   Charlie JONETTA Halsted 08/18/2024, 8:23 AM

## 2024-08-19 NOTE — Progress Notes (Unsigned)
 [facility] [address] [place]. [place] [address] [id number]       STROKE FOLLOW UP NOTE  Thomas Norman Date of Birth:  07-06-1951 Medical Record Number:  [id number]   Reason for visit: Stroke and seizure follow up    CHIEF COMPLAINT:  No chief complaint on file.   HPI:  Update 08/20/2024 JM: Patient returns for sooner scheduled visit.  Patient contacted office on 10/5 reporting episode on 10/4 where he had  difficulty gathering my thoughts together, reported this lasted throughout the entirety of the day and resolved by the following day.  He did report not sleeping well the night prior as well as not drinking as much water the day prior possibly leading to symptoms.  No other symptoms such as weakness, speech changes, vision changes or changes in gait/balance.  He was encouraged to follow-up with his PCP to rule out underlying infection or electrolyte abnormalities contributing, was not advised to proceed to ED as no other associated strokelike symptoms and returned to baseline. Patient sent mychart message on 10/17 reporting visit with PCP with completion of lab work and noted confusion still present but not worse (although previously reported this had resolved). Lab work reviewed which was largely benign except slightly low normal vitamin B12, he was scheduled for follow up visit. Wife called office on 10/24 noting increased memory and behavioral issues, he was advised to proceed to ED.  He proceeded to ED with work up showing right parietal subacute ICH, questioned underlying CAA as etiology.  Discussed importance of strict BP management, no antithrombotics and no statin therapy.  He continued on Keppra  for seizure prevention.        History provided for reference purposes only Update 12/14/2023 JM: Patient is being seen for yearly stroke and seizure follow-up accompanied by his wife.  He has been doing well since prior visit.  Denies any additional  seizure activity, reports compliance on Keppra . Wife mentions some increased irritability and mood swings but only on occasion, husband believes more from being home all the time now where previously he was working.  Denies any new stroke/TIA symptoms. He was seen by audiology and ENT and found to have mild hearing loss. He does report longstanding exposure to loud noise which is more likely the culprit especially as hearing loss was noted bilaterally.  Blood pressure well-managed, occasionally monitors at home and typically 110s-120s.  Routinely follows with PCP for stroke risk factor management.  Update 12/13/2022 JM: Patient returns for 37-month stroke and seizure follow-up accompanied by his wife.  Stable since prior visit without any reoccurring seizure activity or any new stroke/TIA symptoms.  Compliant on Keppra  500 mg twice daily, denies any significant side effects.  Blood pressure well-controlled.  Continues to closely follow with PCP for aggressive stroke risk factor management.  No questions or concerns at this time.  Update 07/06/2022 JM: Patient returns for hospital follow-up regarding recent seizure.  He is accompanied by his wife.  Presented to ED on 9/7 after episode of altered mental status with acute confusion and trouble finding words, while in triage, he had a witnessed tonic-clonic seizure involving all 4 extremities and received Ativan .  Postictally, somnolent with diminished responsiveness. Additional witnessed episode of generalized tonic-clonic activity with repeat Ativan  and loaded with Depakote.  Restarted Keppra  500 mg twice daily. EEG no seizure or epileptiform discharges. MR brain no acute abnormality although noted progression of nonspecific chronic white matter changes.  Is been doing well since  discharge.  Continues on Keppra  500 mg twice daily.  Denies any recent seizure activity and seems to be tolerating dosage well. Previously had difficulty tolerating Keppra  due to increased  irritability and generalized slowness sensation which he noted great improvement after previously stopping keppra  (back in 01/2022 as no seizures since onset in 06/2019). He does endorse increased stressors going on with family and questions if this could have contributed. He also notes lower blood pressure numbers over the past 2 weeks ranging 100-110s/70s, currently taking amlodipine  2.5 mg daily and metoprolol  25 mg twice daily.  He has not further discussed with PCP.  No further concerns at this time.  Update 04/12/2022 JM: Patient returns for stroke and seizure follow-up after prior visit 8 months ago.  He is unaccompanied.  Overall doing well.  Denies new or reoccurring stroke/TIA symptoms.  Denies any seizure activity and has since stopped his keppra  back in March. He does feel better overall since stopping.  Continues to maintain ADLs and IADLs independently.  Blood pressure today 118/68. Monitors at home - typically 110-120s/70-80s.  No new concerns at this time.    Update 08/10/2021 JM: Returns for 53-month stroke and seizure follow-up.  Unaccompanied.  Overall stable.  Denies new or reoccurring stroke/TIA symptoms.  Compliant on Keppra  tolerating and no seizure activity. He does report recent lab work by PCP which showed keppra  level on lower side of normal and was told dosage may need to be increased. Continues to maintain ADLs and IADLs independently.  Blood pressure today 129/74.  No new concerns at this time.  Update 02/02/2021 JM: Thomas Norman returns for 65-month stroke and seizure follow-up  Stable from stroke standpoint without new or recurring stroke/TIA symptoms Blood pressure today 135/77 on amlodipine  2.5 mg daily and metoprolol  25 mg twice daily. Monitors at home and typically 110-120s/70s-80s.  Remains on Keppra  500 mg twice daily tolerating without side effects without any recent seizure activity Remains active maintaining all ADLs and IADLs independently He does report chronic history  of easy bruising more so upper arms   No further concerns at this time  Update 07/22/2020 JM: Thomas Norman is being seen for stroke follow-up.  He has been doing well since prior visit without new or reoccurring stroke/TIA symptoms.  He remains on Keppra  500 mg twice daily tolerating well without seizure activity.  Blood pressure today 131/77.  He does monitor at home and typically 110-120/80s.  Reports recent physical with PCP with lab work all satisfactory.  No concerns at this time.  Update 02/10/2020 JM: Thomas Norman returns for follow-up regarding left parietal ICH in 06/2019.  He has been doing well since prior visit without residual stroke deficits or new/reoccurring stroke/TIA symptoms.  He does note occasional short-term memory issues prior to his stroke and has noticed improvements of short-term memory after his stroke.  He also endorses prior episodes of transient confusion lasting approximately 30 minutes and then subsiding.  He reports 3 prior episodes with last episode occurring approximately 3 years prior to his stroke.  He had no work-up or evaluation for those episodes previously.  Denies any recurrent episodes or concerns.  Repeat MRI w/wo contrast 11/12/2019 again did not show any abnormalities or significant change from prior MRI.  Continues on Keppra  500 mg twice daily tolerating well without recurrent seizure activity.  Blood pressure today 122/70.  Reports recent lab work by PCP which did show slightly elevated lipid panel and has restarted on omega-3 and plans on repeating lipid panel  PCP in the fall.  No concerns at this time.  Update 10/09/2019: Thomas Norman is a 73 year old male who is being seen today for stroke follow-up.  He has been doing well from a stroke standpoint without residual deficits or reoccurring/new stroke/TIA symptoms.  MRI w/wo contrast was done on 09/07/2019 which did not show any underlying abnormalities for possible cause/etiology of recent hemorrhage.  Also repeated  EEG which was normal.  He was evaluated by [facility] neurology for second opinion regarding left parietal ICH etiology.  It was recommended by Dr. Tamea to undergo catheter angiogram for further evaluation.  Angiogram unremarkable for evidence of AVMs or abnormalities.  He continues to be stable from a neurological standpoint without residual deficits.  He continues on Keppra  tolerating well without reoccurring seizure activity.  Continues to monitor blood pressure at home and typically range 100-1 29/70 to 80s.  Patient questions etiology of hemorrhage which was primarily discussed during visit.  He does endorse after his fall, he immediately returned to trying to pull his trailer using increased exertion and continued increased exertion up until he presented to ED.  He also endorses possible worsening cognition/memory as he uses a calendar to write dates are important information down typically 1 day/week but he noticed after his fall, he was writing something on his calendar daily.  He also reports upon his symptom onset of increased confusion and aphasia, he took 2x 325 mg tablets of aspirin as he believes he was having a stroke.  Stroke admission 07/13/2019: Thomas Norman is a 73 y.o. male with history of HTN and HLD  presented on 07/13/2019 with aphasia and confusion.  Witnessed tonic-clonic seizure activity in ED lobby.    Stroke work-up revealed small acute left parietal ICH as evidenced on CT head and MRI likely related to hypertension.  He did not receive IV t-PA due to ICH.  MRI did not show evidence of underlying mass lesion, AVM or CAA but did show mild scattered subarachnoid hemorrhage seen within the adjacent left frontal-occipital region.  CTA head/neck unremarkable.  LDL 95.  A1c 5.2.  Witnessed tonic-clonic seizure while in ER with EEG showing evidence of epileptogenicity in left frontal area along with severe diffuse encephalopathy likely secondary to sedated state during EEG.  Elevated blood  pressure post seizure activity but otherwise stable during admission.  Loaded with Keppra  and recommended continuation at discharge for seizure prophylaxis.  HTN and HLD stable.  Other stroke risk factors include advanced age and EtOH use but no prior history of stroke.  Discharged home in stable condition recommendation of outpatient PT/OT.  Initial visit 08/19/2019 JM: Thomas Norman is a 73 year old male who is being seen today for stroke follow-up accompanied by his wife.  He has been doing well since discharge with complete resolution of residual deficits.  He was initially participating in outpatient PT/ST and has since been discharged.  He has not returned back to prior activities as he was not sure of any restrictions and fear of reoccurring stroke or worsening of bleed.  He does endorse roughly 10 days prior to symptom onset, he was attempting to move a loaded trailer but his hand slipped off the jack and fell backwards with considerable force hitting the back portion of his head.  He denies loss of consciousness but reports worst head I ever remember having.  He did experience pain for approximately 30 minutes but then resolved and did not experience any headache, vision loss, weakness, AMS or speech difficulty  until he presented to the ED.  Blood pressure has always been well managed typically ranging 110-120/70s.  Blood pressure today 116/70.  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  He has remained on Keppra  500 mg twice daily without side effects or reoccurring seizure activity.  He does have a history of easy bruising noticeably greater distal extremities upper>lower.  He ensures avoidance of aspirin, aspirin-containing products or ibuprofen  products. no further concerns at this time.        ROS:   14 system review of systems performed and negative with exception of see HPI  PMH:  Past Medical History:   Diagnosis Date   Acute respiratory failure (HCC)    Hypertension    ICH (intracerebral hemorrhage) (HCC) 07/14/2019   Intracranial hemorrhage (HCC)    Kidney calculi    Seizures (HCC)     PSH:  Past Surgical History:  Procedure Laterality Date   LITHOTRIPSY     skin cancers     TONSILLECTOMY      Social History:  Social History   Socioeconomic History   Marital status: Married    Spouse name: Not on file   Number of children: Not on file   Years of education: Not on file   Highest education level: Not on file  Occupational History   Not on file  Tobacco Use   Smoking status: Never   Smokeless tobacco: Never  Substance and Sexual Activity   Alcohol use: Yes    Alcohol/week: 1.0 standard drink of alcohol    Types: 1 Glasses of wine per week   Drug use: No   Sexual activity: Yes  Other Topics Concern   Not on file  Social History Narrative   Not on file   Social Drivers of Health   Financial Resource Strain: Not on file  Food Insecurity: No Food Insecurity (08/16/2024)   Hunger Vital Sign    Worried About Running Out of Food in the Last Year: Never true    Ran Out of Food in the Last Year: Never true  Transportation Needs: No Transportation Needs (08/16/2024)   PRAPARE - Administrator, Civil Service (Medical): No    Lack of Transportation (Non-Medical): No  Physical Activity: Not on file  Stress: Not on file  Social Connections: Moderately Integrated (08/16/2024)   Social Connection and Isolation Panel    Frequency of Communication with Friends and Family: Three times a week    Frequency of Social Gatherings with Friends and Family: Once a week    Attends Religious Services: 1 to 4 times per year    Active Member of [employer] or Organizations: No    Attends Banker Meetings: Never    Marital Status: Married  Catering Manager Violence: Not At Risk (08/16/2024)   Humiliation, Afraid, Rape, and Kick questionnaire    Fear of Current or  Ex-Partner: No    Emotionally Abused: No    Physically Abused: No    Sexually Abused: No    Family History:  Family History  Problem Relation Age of Onset   Diabetes Mother    Dementia Mother     Medications:   Current Outpatient Medications on File Prior to Visit  Medication Sig Dispense Refill   amLODipine  (NORVASC ) 2.5 MG tablet Take 1 tablet (2.5 mg total) by mouth daily. 30 tablet 2   Coenzyme Q10 (CO Q 10 PO) Take 1 tablet  by mouth daily.     Cyanocobalamin (VITAMIN B-12 PO) Take 1 tablet by mouth daily.     levETIRAcetam  (KEPPRA ) 500 MG tablet Take 1 tablet (500 mg total) by mouth 2 (two) times daily. 180 tablet 3   metoprolol  tartrate (LOPRESSOR ) 25 MG tablet Take 1 tablet (25 mg total) by mouth 2 (two) times daily. 60 tablet 2   Multiple Vitamins-Minerals (VITAMIN D3 COMPLETE PO) Take 1 tablet by mouth daily.     Omega-3 Fatty Acids (FISH OIL) 1000 MG CAPS Take 1 capsule by mouth See admin instructions. Take one tablet by mouth on Monday, Wednesdays and Fridays only per patiebt     No current facility-administered medications on file prior to visit.    Allergies:   Allergies  Allergen Reactions   Ace Inhibitors Other (See Comments)    unknown   Sulfonamide Derivatives Rash     Physical Exam  There were no vitals filed for this visit.  There is no height or weight on file to calculate BMI. No results found.  General: well developed, well nourished, very pleasant middle-age Caucasian male, seated, in no evident distress Head: head normocephalic and atraumatic.   Neck: supple with no carotid or supraclavicular bruits Cardiovascular: regular rate and rhythm, no murmurs Musculoskeletal: no deformity Skin:  no rash/petichiae Vascular:  Normal pulses all extremities   Neurologic Exam Mental Status: Awake and fully alert.   Fluent speech and language.  Oriented to place and time. Recent and remote memory intact. Attention span, concentration and fund of knowledge  appropriate. Mood and affect appropriate.  Cranial Nerves: Pupils equal, briskly reactive to light. Extraocular movements full without nystagmus. Visual fields full to confrontation. Hearing intact during visit, mild loss bilaterally. Facial sensation intact. Face, tongue, palate moves normally and symmetrically.  Motor: Normal bulk and tone. Normal strength in all tested extremity muscles. Sensory.: intact to touch , pinprick , position and vibratory sensation.  Coordination: Rapid alternating movements normal in all extremities. Finger-to-nose and heel-to-shin performed accurately bilaterally. Gait and Station: Arises from chair without difficulty. Stance is normal. Gait demonstrates normal stride length and balance without use of assistive device Reflexes: 1+ and symmetric. Toes downgoing.         ASSESSMENT: Thomas Norman is a 73 y.o. 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  History of L parietal ICH and scattered subarachnoid hemorrhage left parietal-occipital region with subsequent tonic-clonic seizure in 06/2019 without residual deficits. Vascular risk factors include HTN and HLD.  MRI W/WO contrast x2 negative for underlying abnormalities.  He was evaluated by [facility] neurology for second opinion regarding hemorrhage etiology which remained indeterminate.  Breakthrough seizure 06/2022 off keppra  and was restarted at that time     PLAN:  R parietal ICH - ?CAA Hx of left parietal ICH/parietal-occipital SAH:  Recovered well without residual deficits No indication for antithrombotic therefore advised to avoid aspirin, aspirin-containing products and ibuprofen  products.  Continue fish oil for secondary stroke prevention.  Maintain strict control of hypertension  with blood pressure goal below 130/90  Seizure, post ICH:  No additional seizure activity Continue Keppra  500 mg twice daily -refill provided. Does report some irritability and mood swings, discussed possibly switching to Briviact which can come with less mood side effects but declines interest at this time. Last  tonic-clonic seizure x2 06/2022 off Keppra   Previously, stopped keppra  per pt request in 01/2022 as seizure free since onset in 06/2019. As seizure occurred off medication, it will be recommended lifelong use of antiseizure medication at this time Advised to call office with any seizure activity and avoidance of seizure provoking triggers     Follow up in 1 year or call earlier if needed   CC:  Pahwani, Velna SAUNDERS, MD      Harlene Bogaert, AGNP-BC  [facility] [address] [place], [place] [address]  Phone [phone] Fax [phone] Note: This document was prepared with digital dictation and possible smart phrase technology. Any transcriptional errors that result from this process are unintentional.

## 2024-08-20 ENCOUNTER — Ambulatory Visit: Admitting: Adult Health

## 2024-08-20 ENCOUNTER — Telehealth: Payer: Self-pay | Admitting: Adult Health

## 2024-08-20 ENCOUNTER — Encounter: Payer: Self-pay | Admitting: Adult Health

## 2024-08-20 VITALS — BP 123/69 | HR 61 | Ht 71.0 in | Wt 164.0 lb

## 2024-08-20 DIAGNOSIS — E538 Deficiency of other specified B group vitamins: Secondary | ICD-10-CM | POA: Diagnosis not present

## 2024-08-20 DIAGNOSIS — E854 Organ-limited amyloidosis: Secondary | ICD-10-CM

## 2024-08-20 DIAGNOSIS — I680 Cerebral amyloid angiopathy: Secondary | ICD-10-CM | POA: Diagnosis not present

## 2024-08-20 DIAGNOSIS — I639 Cerebral infarction, unspecified: Secondary | ICD-10-CM | POA: Diagnosis not present

## 2024-08-20 DIAGNOSIS — I611 Nontraumatic intracerebral hemorrhage in hemisphere, cortical: Secondary | ICD-10-CM

## 2024-08-20 DIAGNOSIS — I609 Nontraumatic subarachnoid hemorrhage, unspecified: Secondary | ICD-10-CM

## 2024-08-20 DIAGNOSIS — R569 Unspecified convulsions: Secondary | ICD-10-CM | POA: Diagnosis not present

## 2024-08-20 MED ORDER — LEVETIRACETAM 500 MG PO TABS: 500.0000 mg | ORAL_TABLET | Freq: Two times a day (BID) | ORAL | 3 refills | Status: AC

## 2024-08-20 NOTE — Telephone Encounter (Signed)
 Referral for neurology fax to [facility]. Phone: [phone], Fax: [phone]

## 2024-08-20 NOTE — Patient Instructions (Addendum)
 Your Plan:  You will be called to repeat an MRI brain around end of January   If similar event occurs again, please call [phone] immediatly for more emergent evaluation   Continue keppra  500mg  twice daily for seizure prevention  Continue B12 supplement - follow up with PCP for ongoing monitoring and management  Referral will be sent to [facility] neurology - you will be called to schedule       Follow up in March as scheduled       Thank you for coming to see us  at [facility]. I hope we have been able to provide you high quality care today.  You may receive a patient satisfaction survey over the next few weeks. We would appreciate your feedback and comments so that we may continue to improve ourselves and the health of our patients.

## 2024-08-21 LAB — VITAMIN B1: Vitamin B1 (Thiamine): 78.6 nmol/L (ref 66.5–200.0)

## 2024-08-27 DIAGNOSIS — Z0000 Encounter for general adult medical examination without abnormal findings: Secondary | ICD-10-CM | POA: Diagnosis not present

## 2024-08-27 DIAGNOSIS — Z8679 Personal history of other diseases of the circulatory system: Secondary | ICD-10-CM | POA: Diagnosis not present

## 2024-08-27 DIAGNOSIS — I10 Essential (primary) hypertension: Secondary | ICD-10-CM | POA: Diagnosis not present

## 2024-08-27 DIAGNOSIS — Z125 Encounter for screening for malignant neoplasm of prostate: Secondary | ICD-10-CM | POA: Diagnosis not present

## 2024-08-27 DIAGNOSIS — I69398 Other sequelae of cerebral infarction: Secondary | ICD-10-CM | POA: Diagnosis not present

## 2024-08-27 DIAGNOSIS — E538 Deficiency of other specified B group vitamins: Secondary | ICD-10-CM | POA: Diagnosis not present

## 2024-08-27 DIAGNOSIS — E7800 Pure hypercholesterolemia, unspecified: Secondary | ICD-10-CM | POA: Diagnosis not present

## 2024-08-27 DIAGNOSIS — Z85828 Personal history of other malignant neoplasm of skin: Secondary | ICD-10-CM | POA: Diagnosis not present

## 2024-08-27 DIAGNOSIS — R569 Unspecified convulsions: Secondary | ICD-10-CM | POA: Diagnosis not present

## 2024-08-27 DIAGNOSIS — Z1331 Encounter for screening for depression: Secondary | ICD-10-CM | POA: Diagnosis not present

## 2024-08-27 DIAGNOSIS — Z1211 Encounter for screening for malignant neoplasm of colon: Secondary | ICD-10-CM | POA: Diagnosis not present

## 2024-08-29 DIAGNOSIS — H9313 Tinnitus, bilateral: Secondary | ICD-10-CM | POA: Diagnosis not present

## 2024-08-29 DIAGNOSIS — H903 Sensorineural hearing loss, bilateral: Secondary | ICD-10-CM | POA: Diagnosis not present

## 2024-09-03 DIAGNOSIS — Z1211 Encounter for screening for malignant neoplasm of colon: Secondary | ICD-10-CM | POA: Diagnosis not present

## 2024-09-05 NOTE — Telephone Encounter (Signed)
 Can you please resend referral to [facility] neurology - patient called today and was advised they never received. Thank you.

## 2024-09-09 DIAGNOSIS — I611 Nontraumatic intracerebral hemorrhage in hemisphere, cortical: Secondary | ICD-10-CM | POA: Diagnosis not present

## 2024-09-10 NOTE — Telephone Encounter (Signed)
 Spoke with [facility], Arlyne; Patient was seen on 09/09/24 with Dr. Hosie.

## 2024-09-25 DIAGNOSIS — E538 Deficiency of other specified B group vitamins: Secondary | ICD-10-CM | POA: Diagnosis not present

## 2024-09-25 DIAGNOSIS — R972 Elevated prostate specific antigen [PSA]: Secondary | ICD-10-CM | POA: Diagnosis not present

## 2024-11-04 ENCOUNTER — Other Ambulatory Visit: Payer: Self-pay | Admitting: Adult Health

## 2024-11-04 ENCOUNTER — Telehealth: Payer: Self-pay | Admitting: Adult Health

## 2024-11-04 DIAGNOSIS — I611 Nontraumatic intracerebral hemorrhage in hemisphere, cortical: Secondary | ICD-10-CM

## 2024-11-04 NOTE — Telephone Encounter (Signed)
 no auth required sent to GI [phone]

## 2024-11-13 ENCOUNTER — Encounter: Payer: Self-pay | Admitting: Adult Health

## 2024-11-20 ENCOUNTER — Ambulatory Visit
Admission: RE | Admit: 2024-11-20 | Discharge: 2024-11-20 | Disposition: A | Source: Ambulatory Visit | Attending: Adult Health

## 2024-11-20 DIAGNOSIS — I611 Nontraumatic intracerebral hemorrhage in hemisphere, cortical: Secondary | ICD-10-CM

## 2024-11-20 MED ORDER — GADOPICLENOL 0.5 MMOL/ML IV SOLN
7.0000 mL | Freq: Once | INTRAVENOUS | Status: AC | PRN
  Administered 2024-11-20: 7 mL via INTRAVENOUS

## 2024-11-27 ENCOUNTER — Ambulatory Visit: Payer: Self-pay | Admitting: Adult Health

## 2024-11-28 NOTE — Telephone Encounter (Signed)
 Contacted patient back.  Currently in the process of working with [facility] to participate in ALP-APP clinical trial.  Plans on repeating MRI brain at the end of this month as part of the trial as well as undergoing routine spinal taps.  He is still considering participation at this time.  He will notify the trial of recent MRI results.  All questions answered to patient's satisfaction.  Plan to follow-up in office as scheduled in March.

## 2024-11-28 NOTE — Telephone Encounter (Signed)
 Patient is requesting a call back about his results.

## 2025-01-06 ENCOUNTER — Ambulatory Visit: Payer: Medicare Other | Admitting: Adult Health
